# Patient Record
Sex: Female | Born: 1935 | Race: White | Hispanic: No | Marital: Married | Smoking: Never smoker
Health system: Southern US, Community
[De-identification: ages and names within clinical notes are randomized; demographics above are authoritative.]

## PROBLEM LIST (undated history)

## (undated) ENCOUNTER — Emergency Department (HOSPITAL_BASED_OUTPATIENT_CLINIC_OR_DEPARTMENT_OTHER): Discharge status: Absent without leave

## (undated) DIAGNOSIS — E119 Type 2 diabetes mellitus without complications: Secondary | ICD-10-CM

## (undated) DIAGNOSIS — I779 Disorder of arteries and arterioles, unspecified: Secondary | ICD-10-CM

## (undated) DIAGNOSIS — Z789 Other specified health status: Secondary | ICD-10-CM

## (undated) DIAGNOSIS — J45909 Unspecified asthma, uncomplicated: Secondary | ICD-10-CM

## (undated) DIAGNOSIS — M1990 Unspecified osteoarthritis, unspecified site: Secondary | ICD-10-CM

## (undated) DIAGNOSIS — G4733 Obstructive sleep apnea (adult) (pediatric): Secondary | ICD-10-CM

## (undated) DIAGNOSIS — J449 Chronic obstructive pulmonary disease, unspecified: Secondary | ICD-10-CM

## (undated) DIAGNOSIS — F419 Anxiety disorder, unspecified: Secondary | ICD-10-CM

## (undated) DIAGNOSIS — E785 Hyperlipidemia, unspecified: Secondary | ICD-10-CM

## (undated) DIAGNOSIS — F329 Major depressive disorder, single episode, unspecified: Secondary | ICD-10-CM

## (undated) DIAGNOSIS — K219 Gastro-esophageal reflux disease without esophagitis: Secondary | ICD-10-CM

## (undated) DIAGNOSIS — R0989 Other specified symptoms and signs involving the circulatory and respiratory systems: Secondary | ICD-10-CM

## (undated) DIAGNOSIS — R5381 Other malaise: Secondary | ICD-10-CM

## (undated) DIAGNOSIS — G4700 Insomnia, unspecified: Secondary | ICD-10-CM

## (undated) DIAGNOSIS — R42 Dizziness and giddiness: Secondary | ICD-10-CM

## (undated) DIAGNOSIS — F32A Depression, unspecified: Secondary | ICD-10-CM

## (undated) DIAGNOSIS — IMO0001 Reserved for inherently not codable concepts without codable children: Secondary | ICD-10-CM

## (undated) DIAGNOSIS — I739 Peripheral vascular disease, unspecified: Secondary | ICD-10-CM

## (undated) DIAGNOSIS — Z0389 Encounter for observation for other suspected diseases and conditions ruled out: Secondary | ICD-10-CM

## (undated) HISTORY — DX: Unspecified osteoarthritis, unspecified site: M19.90

## (undated) HISTORY — DX: Depression, unspecified: F32.A

## (undated) HISTORY — DX: Anxiety disorder, unspecified: F41.9

## (undated) HISTORY — DX: Disorder of arteries and arterioles, unspecified: I77.9

## (undated) HISTORY — PX: FOOT SURGERY: SHX648

## (undated) HISTORY — DX: Other malaise: R53.81

## (undated) HISTORY — PX: OTHER SURGICAL HISTORY: SHX169

## (undated) HISTORY — DX: Reserved for inherently not codable concepts without codable children: IMO0001

## (undated) HISTORY — DX: Other specified symptoms and signs involving the circulatory and respiratory systems: R09.89

## (undated) HISTORY — DX: Peripheral vascular disease, unspecified: I73.9

## (undated) HISTORY — PX: DILATION AND CURETTAGE OF UTERUS: SHX78

## (undated) HISTORY — DX: Other specified health status: Z78.9

## (undated) HISTORY — DX: Chronic obstructive pulmonary disease, unspecified: J44.9

## (undated) HISTORY — DX: Type 2 diabetes mellitus without complications: E11.9

## (undated) HISTORY — DX: Major depressive disorder, single episode, unspecified: F32.9

## (undated) HISTORY — DX: Morbid (severe) obesity due to excess calories: E66.01

## (undated) HISTORY — DX: Unspecified asthma, uncomplicated: J45.909

## (undated) HISTORY — DX: Hyperlipidemia, unspecified: E78.5

## (undated) HISTORY — DX: Insomnia, unspecified: G47.00

## (undated) HISTORY — DX: Encounter for observation for other suspected diseases and conditions ruled out: Z03.89

## (undated) HISTORY — DX: Obstructive sleep apnea (adult) (pediatric): G47.33

---

## 2000-06-27 ENCOUNTER — Encounter: Payer: Self-pay | Admitting: Emergency Medicine

## 2000-06-27 ENCOUNTER — Inpatient Hospital Stay (HOSPITAL_COMMUNITY): Admission: AC | Admit: 2000-06-27 | Discharge: 2000-07-01 | Payer: Self-pay

## 2000-06-27 ENCOUNTER — Encounter: Payer: Self-pay | Admitting: Otolaryngology

## 2000-06-28 ENCOUNTER — Encounter: Payer: Self-pay | Admitting: Surgery

## 2000-06-30 ENCOUNTER — Encounter: Payer: Self-pay | Admitting: General Surgery

## 2004-04-02 ENCOUNTER — Inpatient Hospital Stay: Payer: Self-pay | Admitting: Internal Medicine

## 2004-06-14 ENCOUNTER — Ambulatory Visit: Payer: Self-pay | Admitting: Gastroenterology

## 2004-06-15 ENCOUNTER — Ambulatory Visit: Payer: Self-pay | Admitting: Orthopaedic Surgery

## 2004-06-30 ENCOUNTER — Ambulatory Visit: Payer: Self-pay | Admitting: Orthopaedic Surgery

## 2004-07-07 ENCOUNTER — Ambulatory Visit: Payer: Self-pay | Admitting: Orthopaedic Surgery

## 2004-09-06 ENCOUNTER — Ambulatory Visit: Payer: Self-pay | Admitting: Internal Medicine

## 2004-09-09 ENCOUNTER — Ambulatory Visit: Payer: Self-pay | Admitting: Gastroenterology

## 2005-05-30 ENCOUNTER — Ambulatory Visit: Payer: Self-pay | Admitting: Internal Medicine

## 2005-06-06 ENCOUNTER — Ambulatory Visit: Payer: Self-pay | Admitting: Internal Medicine

## 2005-07-17 ENCOUNTER — Ambulatory Visit: Payer: Self-pay | Admitting: Psychiatry

## 2005-07-18 ENCOUNTER — Ambulatory Visit: Payer: Self-pay | Admitting: Psychiatry

## 2005-09-11 ENCOUNTER — Ambulatory Visit: Payer: Self-pay | Admitting: Internal Medicine

## 2005-10-30 ENCOUNTER — Ambulatory Visit: Payer: Self-pay | Admitting: Specialist

## 2005-10-30 ENCOUNTER — Encounter: Payer: Self-pay | Admitting: Internal Medicine

## 2006-02-19 ENCOUNTER — Other Ambulatory Visit: Payer: Self-pay

## 2006-02-19 ENCOUNTER — Inpatient Hospital Stay: Payer: Self-pay | Admitting: Internal Medicine

## 2006-03-06 ENCOUNTER — Ambulatory Visit: Payer: Self-pay | Admitting: Internal Medicine

## 2006-06-29 ENCOUNTER — Emergency Department: Payer: Self-pay | Admitting: General Practice

## 2006-09-18 ENCOUNTER — Ambulatory Visit: Payer: Self-pay | Admitting: Internal Medicine

## 2006-11-05 ENCOUNTER — Ambulatory Visit: Payer: Self-pay | Admitting: Internal Medicine

## 2006-11-15 ENCOUNTER — Ambulatory Visit: Payer: Self-pay | Admitting: *Deleted

## 2007-04-22 ENCOUNTER — Ambulatory Visit: Payer: Self-pay | Admitting: Podiatry

## 2007-05-23 ENCOUNTER — Ambulatory Visit: Payer: Self-pay | Admitting: *Deleted

## 2007-09-18 ENCOUNTER — Ambulatory Visit: Payer: Self-pay | Admitting: Vascular Surgery

## 2007-09-26 ENCOUNTER — Ambulatory Visit: Payer: Self-pay | Admitting: Internal Medicine

## 2007-10-30 ENCOUNTER — Inpatient Hospital Stay: Payer: Self-pay | Admitting: Internal Medicine

## 2007-10-30 ENCOUNTER — Ambulatory Visit: Payer: Self-pay | Admitting: Internal Medicine

## 2007-11-14 ENCOUNTER — Ambulatory Visit: Payer: Self-pay | Admitting: Internal Medicine

## 2007-11-21 ENCOUNTER — Ambulatory Visit: Payer: Self-pay | Admitting: *Deleted

## 2007-12-11 ENCOUNTER — Encounter: Payer: Self-pay | Admitting: Internal Medicine

## 2007-12-11 ENCOUNTER — Ambulatory Visit: Payer: Self-pay

## 2008-02-03 ENCOUNTER — Ambulatory Visit: Payer: Self-pay | Admitting: Podiatry

## 2008-02-07 ENCOUNTER — Ambulatory Visit: Payer: Self-pay | Admitting: Podiatry

## 2008-05-21 ENCOUNTER — Ambulatory Visit: Payer: Self-pay | Admitting: *Deleted

## 2008-10-19 ENCOUNTER — Ambulatory Visit: Payer: Self-pay | Admitting: Internal Medicine

## 2008-10-20 ENCOUNTER — Ambulatory Visit: Payer: Self-pay | Admitting: Internal Medicine

## 2008-10-29 ENCOUNTER — Encounter: Payer: Self-pay | Admitting: Internal Medicine

## 2008-11-02 ENCOUNTER — Encounter: Payer: Self-pay | Admitting: Internal Medicine

## 2008-12-01 ENCOUNTER — Ambulatory Visit: Payer: Self-pay | Admitting: Vascular Surgery

## 2008-12-02 ENCOUNTER — Encounter: Payer: Self-pay | Admitting: Internal Medicine

## 2009-01-02 ENCOUNTER — Encounter: Payer: Self-pay | Admitting: Internal Medicine

## 2009-05-05 ENCOUNTER — Ambulatory Visit: Payer: Self-pay | Admitting: Gastroenterology

## 2009-05-18 ENCOUNTER — Ambulatory Visit: Payer: Self-pay | Admitting: Vascular Surgery

## 2009-09-15 ENCOUNTER — Ambulatory Visit: Payer: Self-pay | Admitting: Internal Medicine

## 2009-10-27 ENCOUNTER — Ambulatory Visit: Payer: Self-pay | Admitting: Internal Medicine

## 2009-12-17 ENCOUNTER — Ambulatory Visit: Payer: Self-pay | Admitting: Vascular Surgery

## 2010-01-26 ENCOUNTER — Ambulatory Visit
Admission: RE | Admit: 2010-01-26 | Discharge: 2010-01-26 | Payer: Self-pay | Source: Home / Self Care | Attending: Internal Medicine | Admitting: Internal Medicine

## 2010-01-26 ENCOUNTER — Encounter: Payer: Self-pay | Admitting: Internal Medicine

## 2010-01-26 DIAGNOSIS — R0602 Shortness of breath: Secondary | ICD-10-CM | POA: Insufficient documentation

## 2010-01-26 DIAGNOSIS — R0789 Other chest pain: Secondary | ICD-10-CM | POA: Insufficient documentation

## 2010-01-26 DIAGNOSIS — I6529 Occlusion and stenosis of unspecified carotid artery: Secondary | ICD-10-CM | POA: Insufficient documentation

## 2010-02-03 NOTE — Assessment & Plan Note (Signed)
Summary: f/u/sab  Medications Added METFORMIN HCL 500 MG TABS (METFORMIN HCL) 1 tablet once daily ADVAIR DISKUS 100-50 MCG/DOSE AEPB (FLUTICASONE-SALMETEROL) as directed METROGEL 1 % GEL (METRONIDAZOLE) as directed NEXIUM 40 MG CPDR (ESOMEPRAZOLE MAGNESIUM) 1 tablet in the morning MAXZIDE 75-50 MG TABS (TRIAMTERENE-HCTZ) 1/2 tablet in the morning MICARDIS 80 MG TABS (TELMISARTAN) 1 tablet once daily NASONEX 50 MCG/ACT SUSP (MOMETASONE FUROATE) as directed, at night ALPRAZOLAM 1 MG TABS (ALPRAZOLAM) 1/2 tablet at night VENTOLIN HFA 108 (90 BASE) MCG/ACT AERS (ALBUTEROL SULFATE) as directed SIMVASTATIN 20 MG TABS (SIMVASTATIN) 1 tablet at bedtime GLUCOSAMINE-CHONDROITIN 250-200 MG CAPS (GLUCOSAMINE-CHONDROITIN) 1 tablet in the morning MULTIVITAMINS  TABS (MULTIPLE VITAMIN) 1 tablet once daily ECOTRIN 325 MG TBEC (ASPIRIN) 1 tablet once daily      Allergies Added: ! PENICILLIN ! PHENOBARBITAL  Visit Type:  Follow-up Primary Provider:  Dr. Bethann Punches  CC:  c/o SOB due to asthma. denies chest pain.Marland Kitchen  History of Present Illness: Amber Welch  is a 76 year old woman with multiple medical problems including hypertension, hyperlipidemia, diabetes, morbid obesity, obstructive sleep apnea on BiPAP, depression, and mild asthma.   I saw her for the first time at [facility] in 2009 when she was admitted with atypical chest pain.  She had a Myoview which showed normal ejection fraction and normal perfusion.  She has similar episode in 2008 for which she also had a normal Myoview.  She also had a Holter monitor with Dr. Candelaria Stagers which showed PACs.  F/u echo in 12/09 showed normal LV function with mild MR/   She had remote cardiac catheterization around 2003 which was normal. Returns to clinic after not being seen here for over 2 years.   Continues to have chronic DOE which she attributes to her asthma and obesity. Denies CP or signifcant palpitations. No edema.  Not exercising  regularly. Hard for her to walk due to foot problems. Occasionally does water aerobics. BP at home well controlled.   Has R carotid stenosis followed by [facility] in [place] q6 months. No neuro signs.   Preventive Screening-Counseling & Management  Alcohol-Tobacco     Smoking Status: never  Caffeine-Diet-Exercise     Does Patient Exercise: no      Drug Use:  no.    Current Medications (verified): 1)  Carvedilol 12.5 Mg Tabs (Carvedilol) .... Take One Tablet By Mouth Twice A Day 2)  Metformin Hcl 500 Mg Tabs (Metformin Hcl) .Marland Kitchen.. 1 Tablet Once Daily 3)  Advair Diskus 100-50 Mcg/dose Aepb (Fluticasone-Salmeterol) .... As Directed 4)  Metrogel 1 % Gel (Metronidazole) .... As Directed 5)  Nexium 40 Mg Cpdr (Esomeprazole Magnesium) .Marland Kitchen.. 1 Tablet in The Morning 6)  Maxzide 75-50 Mg Tabs (Triamterene-Hctz) .... 1/2 Tablet in The Morning 7)  Micardis 80 Mg Tabs (Telmisartan) .Marland Kitchen.. 1 Tablet Once Daily 8)  Nasonex 50 Mcg/act Susp (Mometasone Furoate) .... As Directed, At Night 9)  Alprazolam 1 Mg Tabs (Alprazolam) .... 1/2 Tablet At Night 10)  Ventolin Hfa 108 (90 Base) Mcg/act Aers (Albuterol Sulfate) .... As Directed 11)  Simvastatin 20 Mg Tabs (Simvastatin) .Marland Kitchen.. 1 Tablet At Bedtime 12)  Glucosamine-Chondroitin 250-200 Mg Caps (Glucosamine-Chondroitin) .Marland Kitchen.. 1 Tablet in The Morning 13)  Multivitamins  Tabs (Multiple Vitamin) .Marland Kitchen.. 1 Tablet Once Daily 14)  Ecotrin 325 Mg Tbec (Aspirin) .Marland Kitchen.. 1 Tablet Once Daily  Allergies (verified): 1)  ! Penicillin 2)  ! Phenobarbital  Past History:  Family History: Last updated: 01/26/2010 Father: Heart disease-deceased-MI Mother: Heart disease-deceased  Social History:  Last updated: 01/26/2010 Retired  Married  Tobacco Use - No.  Alcohol Use - no Regular Exercise - no Drug Use - no  Risk Factors: Exercise: no (01/26/2010)  Risk Factors: Smoking Status: never (01/26/2010)  Past Medical History: Hypertension Diabetes Type  1 Hyperlipidemia Obesity Chest pain    --previous normal cath 2003   --normal Myoview x 2 Obstructive Sleep Apnea on CPAP  Past Surgical History: right foot surgery right knee surgery  Family History: Father: Heart disease-deceased-MI Mother: Heart disease-deceased  Social History: Retired  Married  Tobacco Use - No.  Alcohol Use - no Regular Exercise - no Drug Use - no Smoking Status:  never Does Patient Exercise:  no Drug Use:  no  Review of Systems       As per HPI and past medical history; otherwise all systems negative.   Vital Signs:  Patient profile:   76 year old female Height:      66.5 inches Weight:      253 pounds BMI:     40.37 Pulse rate:   72 / minute BP sitting:   148 / 78  (left arm) Cuff size:   large  Vitals Entered By: Lysbeth Galas CMA (January 26, 2010 3:46 PM)  Physical Exam  General:  Obese. well appearing no resp difficulty HEENT: normal Neck: supple. no JVD. Carotids 2+ bilat; no bruits. No lymphadenopathy or thryomegaly appreciated. Cor: PMI nondisplaced. Regular rate & rhythm. No rubs, gallops, murmur. Lungs: clear Abdomen: obese. soft, nontender, nondistended.  Extremities: no cyanosis, clubbing, rash, edema Neuro: alert & orientedx3, cranial nerves grossly intact. moves all 4 extremities w/o difficulty. affect pleasant    Impression & Recommendations:  Problem # 1:  CHEST TIGHTNESS-PRESSURE-OTHER (ICD-786.59) Resolved. No further w/u at this time.   Problem # 2:  HYPERTENSION, BENIGN (ICD-401.1) BP mildly elevated here but apparently doing well at home. Will continue to follow with Dr. Hyacinth Meeker.   Problem # 3:  CAROTID ARTERY STENOSIS, RIGHT (ICD-433.10) Continue to follow with [facility]. Continue statin with goal LDL < 70.   Other Orders: EKG w/ Interpretation (93000)  Patient Instructions: 1)  Your physician recommends that you schedule a follow-up appointment in: follow up as needed

## 2010-04-07 ENCOUNTER — Ambulatory Visit: Payer: Medicare Other | Admitting: Unknown Physician Specialty

## 2010-05-17 NOTE — Procedures (Signed)
CAROTID DUPLEX EXAM   INDICATION:  Follow up known carotid artery disease.   HISTORY:  Diabetes:  Yes.  Cardiac:  A fib.  Hypertension:  Yes.  Smoking:  No.  Previous Surgery:  No.  CV History:  No.  Amaurosis Fugax No, Paresthesias No, Hemiparesis No.                                       RIGHT             LEFT  Brachial systolic pressure:         164               170  Brachial Doppler waveforms:         WNL               WNL  Vertebral direction of flow:        Antegrade         Antegrade  DUPLEX VELOCITIES (cm/sec)  CCA peak systolic                   101               134  ECA peak systolic                   262               118  ICA peak systolic                   250               92  ICA end diastolic                   71                30  PLAQUE MORPHOLOGY:                  Heterogenous      Heterogenous  PLAQUE AMOUNT:                      Moderate-to-severe                  Mild  PLAQUE LOCATION:                    ICA, ECA          ICA   IMPRESSION:  1. Right internal carotid artery suggests 60% to 70% stenosis.  2. Left internal carotid artery suggests 20% to 39% stenosis.  3. Right external carotid artery stenosis.  4. Bilateral vertebrals appear with antegrade flow.   ___________________________________________  Quita Skye Hart [place], M.D.   CB/MEDQ  D:  05/18/2009  T:  05/19/2009  Job:  [id number]

## 2010-05-17 NOTE — Procedures (Signed)
CAROTID DUPLEX EXAM   INDICATION:  Followup of known carotid artery disease.   HISTORY:  Diabetes:  Yes.  Cardiac:  Yes.  Hypertension:  Yes.  Smoking:  No.  Previous Surgery:  No.  CV History:  No.  Amaurosis Fugax No, Paresthesias No, Hemiparesis No                                       RIGHT             LEFT  Brachial systolic pressure:         152               160  Brachial Doppler waveforms:         WNL               WNL  Vertebral direction of flow:        Antegrade         Antegrade  DUPLEX VELOCITIES (cm/sec)  CCA peak systolic                   92                97  ECA peak systolic                   262               110  ICA peak systolic                   226               90  ICA end diastolic                   53                35  PLAQUE MORPHOLOGY:                  Heterogeneous     Heterogeneous  PLAQUE AMOUNT:                      Moderate          Mild  PLAQUE LOCATION:                    Bif/ICA/ECA       Bif/ICA   IMPRESSION:  1. 60-79% right internal carotid artery stenosis.  2. 20-39% left internal carotid artery stenosis.       ___________________________________________  P. Liliane Bade, M.D.   AC/MEDQ  D:  05/21/2008  T:  05/21/2008  Job:  [id number]

## 2010-05-17 NOTE — Assessment & Plan Note (Signed)
OFFICE VISIT   RASHAE, ROTHER  DOB:  06/02/1935                                       11/21/2007  CHART#:[id number]   The patient is a 76 year old female initially evaluated approximately 1  year ago with right internal carotid artery stenosis.  At that time she  had undergone an MRA of the neck revealing a severe right internal  carotid artery stenosis.  She was asymptomatic.  Doppler evaluation,  however, failed to reveal a high-grade stenosis.  Velocities at that  time measured 158/33 cm/s, consistent with a moderate 40-59% right ICA  stenosis.   She has undergone a repeat carotid Doppler in May of this year with  velocities 163/45 cm/s, again consistent with a 40-59% stenosis.  The  left internal carotid artery reveals mild disease.   The patient presents at this time with some recent complaints of  headache.  This is a general headache.  Not associated with any  instigating factors.  Relieved readily by mild analgesics.  No sensory  deficit, no motor deficit.  No visual disturbance.  No speech problems.   A recent CT angiogram of the neck was carried out at [facility]  and this was interpreted as showing an 80% right internal carotid artery  stenosis.  I did review these films today and feel that the right  internal carotid artery is not severely diseased.  A repeat carotid  Doppler today reveals velocities 221/48 cm/s, consistent with a 60-79%  right ICA stenosis.  This was a limited study.   The patient remains on aspirin daily.  She has recently been started on  a statin.   She has also apparently seen Dr. Arvilla Meres for paroxysmal atrial  fibrillation.   She is a moderately obese 76 year old female, appeared alert and  oriented, no distress.  Blood pressure is 134/83 in the left arm, 114/70  in the right arm, pulse is 67 per minute.  No carotid bruits.  Cranial  nerves intact.  Strength equal bilaterally.  Normal gait.   Putting  together all these findings, I do not feel that there has been  any significant progression of right carotid stenosis, this remains  moderate in severity.  She is not having any apparent symptoms related  to this, her headaches and occasional dizziness are certainly not focal  findings.   My recommendation at this time would be continued follow-up.  Will plan  a carotid Doppler 6 months and return visit to see me.  Should she have  any change in vision, sensation or motor function, I have asked her to  contact me please.   Balinda Quails, M.D.  Electronically Signed   PGH/MEDQ  D:  11/21/2007  T:  11/22/2007  Job:  [id number]   cc:   Claudette Laws. Bensimhon, MD  Annice Needy, MD

## 2010-05-17 NOTE — Procedures (Signed)
CAROTID DUPLEX EXAM   INDICATION:  Followup evaluation of known carotid disease.   HISTORY:  Diabetes:  Yes.  Cardiac:  Atrial fib.  Hypertension:  Yes.  Smoking:  No.  Previous Surgery:  No.  CV History:  No.  Amaurosis Fugax No, Paresthesias No, Hemiparesis No                                       RIGHT             LEFT  Brachial systolic pressure:         154               140  Brachial Doppler waveforms:         Normal            Normal  Vertebral direction of flow:        Antegrade         Antegrade  DUPLEX VELOCITIES (cm/sec)  CCA peak systolic                   117               125  ECA peak systolic                   298               90  ICA peak systolic                   295               106  ICA end diastolic                   42                30  PLAQUE MORPHOLOGY:                  Heterogeneous     Heterogeneous  PLAQUE AMOUNT:                      Moderate to severe                  Mild  PLAQUE LOCATION:                    ICA, ECA          ICA   IMPRESSION:  1. Right internal carotid artery suggests 60%-79% stenosis.  2. Left internal carotid artery suggests 20%-39% stenosis.  3. Right external carotid artery suggests stenosis.  4. Bilateral vertebrals appear antegrade.   ___________________________________________  Amber Welch. Hart [place], M.D.   CB/MEDQ  D:  12/01/2008  T:  12/02/2008  Job:  [id number]

## 2010-05-17 NOTE — Procedures (Signed)
CAROTID DUPLEX EXAM   INDICATION:  Followup, carotid artery disease.   HISTORY:  Diabetes:  Borderline.  Cardiac:  No.  Hypertension:  Yes.  Smoking:  No.  Previous Surgery:  No.  CV History:  No.  Amaurosis Fugax No, Paresthesias No, Hemiparesis No                                       RIGHT             LEFT  Brachial systolic pressure:         128               133  Brachial Doppler waveforms:         Biphasic          Biphasic  Vertebral direction of flow:        Antegrade         Antegrade  DUPLEX VELOCITIES (cm/sec)  CCA peak systolic                   64                82  ECA peak systolic                   122               97  ICA peak systolic                   163               85  ICA end diastolic                   45                29  PLAQUE MORPHOLOGY:                  Mixed             Mixed  PLAQUE AMOUNT:                      Moderate          Mild  PLAQUE LOCATION:                    Bifurcation/ICA   CCA/ICA   IMPRESSION:  1. Right internal carotid artery shows evidence of 40-59% stenosis.  2. Left internal carotid artery shows evidence of 1-39% stenosis.  3. No significant changes from previous study.   ___________________________________________  P. Liliane Bade, M.D.   AS/MEDQ  D:  05/23/2007  T:  05/23/2007  Job:  [id number]

## 2010-05-17 NOTE — Procedures (Signed)
CAROTID DUPLEX EXAM   INDICATION:  Follow up known carotid artery disease.   HISTORY:  Diabetes:  Borderline.  Cardiac:  No.  Hypertension:  Yes.  Smoking:  No.  Previous Surgery:  None.  CV History:  Amaurosis Fugax No, Paresthesias No, Hemiparesis No.                                       RIGHT             LEFT  Brachial systolic pressure:  Brachial Doppler waveforms:  Vertebral direction of flow:        Antegrade  DUPLEX VELOCITIES (cm/sec)  CCA peak systolic                   117  ECA peak systolic                   256  ICA peak systolic                   221  ICA end diastolic                   48  PLAQUE MORPHOLOGY:                  Heterogenous  PLAQUE AMOUNT:                      Moderate  PLAQUE LOCATION:                    ICA, ECA   IMPRESSION:  1. Limited study.  2. 60-79% stenosis noted in the right internal carotid artery.  3. Antegrade right vertebral artery.   ___________________________________________  P. Liliane Bade, M.D.   MG/MEDQ  D:  11/21/2007  T:  11/22/2007  Job:  [id number]

## 2010-05-17 NOTE — Assessment & Plan Note (Signed)
OFFICE VISIT   ALMARIE, KURDZIEL  DOB:  01/21/1934                                       12/17/2009  CHART#:[id number]   This is a former patient of Dr. Madilyn Fireman.   CHIEF COMPLAINT:  Carotid stenosis followup.   HISTORY OF PRESENT ILLNESS:  The patient is a 76 year old woman who was  initially seen by Dr. Madilyn Fireman in 2008 who had a carotid Doppler and MR  angio of the neck to evaluate her carotid stenosis.  She had a history  at that time of bilateral upper extremity numbness and also some left  arm pain.  She otherwise has had no symptoms since that time of  amaurosis fugax, speech or swallowing difficulties.  She has had no  weakness on one side or the other.   MEDICATIONS:  All medications were reviewed and her list was updated.   She has a history of hypertension and multiple medications have been  changed for that.  Otherwise there are no new significant health issues.  She continues to take medication for her hypercholesterolemia and she  also takes an aspirin per day.  She has a history of arthritis for which  she is also on some over-the-counter medications.   Vascular lab done today showed ICA peak systolic over end diastolic on  the right of 301/56.  Previous study done 6 months ago was 250/71.  The  left side has remained stable at 82/28.  The impression was right  internal carotid stenosis of 60%-79% in this asymptomatic patient.   PHYSICAL EXAM:  Heart rate was 74.  Blood pressure was 136/74 on the  left and respiratory rate was 10.  Neurologically she remained intact.  She had no tongue deviation.  No facial droop.  She was alert and  oriented x3.  She had good and equal strength in bilateral upper and  lower extremities.   ASSESSMENT:  Asymptomatic 60%-79% right internal carotid artery stenosis  with a slight increase in her peak systolic internal carotid velocities  on the right, the end diastolic was slightly less, the left was stable.   PLAN:  To have the patient follow up in 6 months with a carotid study.   Della Goo, PA-C   Leonides Sake, MD  Electronically Signed   RR/MEDQ  D:  12/17/2009  T:  12/17/2009  Job:  [id number]

## 2010-05-17 NOTE — Procedures (Signed)
CAROTID DUPLEX EXAM   INDICATION:  Followup evaluation of known carotid artery disease.   HISTORY:  Diabetes:  Yes.  Cardiac:  Atrial fibrillation.  Hypertension:  Yes.  Smoking:  No.  Previous Surgery:  No.  CV History:  No.  Amaurosis Fugax No, Paresthesias No, Hemiparesis No                                       RIGHT             LEFT  Brachial systolic pressure:  Brachial Doppler waveforms:  Vertebral direction of flow:        Antegrade         Antegrade  DUPLEX VELOCITIES (cm/sec)  CCA peak systolic                   101               92  ECA peak systolic                   315               102  ICA peak systolic                   301               82  ICA end diastolic                   56                28  PLAQUE MORPHOLOGY:                  Heterogeneous     Heterogeneous  PLAQUE AMOUNT:                      Moderate / severe Mild  PLAQUE LOCATION:                    ICA,              ICA   IMPRESSION:  1. Right internal carotid artery suggests 60-79% stenosis.  2. Left internal carotid artery suggests 1-39% stenosis.  3. Right external carotid artery stenosis.   ___________________________________________  Leonides Sake, MD   EM/MEDQ  D:  12/17/2009  T:  12/17/2009  Job:  [id number]

## 2010-05-17 NOTE — Procedures (Signed)
CAROTID DUPLEX EXAM   INDICATION:  Right carotid stenosis.   Patient had a carotid Doppler done at [facility] on 10/10/2006  which suggested a 75% stenosis in the right carotid bulb/ICA.   HISTORY:  Diabetes:  No  Cardiac:  COPD  Hypertension:  Yes  Smoking:  No  Previous Surgery:  No  CV History:  No  Amaurosis Fugax No, Paresthesias No, Hemiparesis No                                       RIGHT             LEFT  Brachial systolic pressure:         150               140  Brachial Doppler waveforms:         Biphasic          Biphasic  Vertebral direction of flow:        Antegrade  DUPLEX VELOCITIES (cm/sec)  CCA peak systolic                   94  ECA peak systolic                   134  ICA peak systolic                   158  ICA end diastolic                   33  PLAQUE MORPHOLOGY:                  Mixed  PLAQUE AMOUNT:                      Moderate  PLAQUE LOCATION:                    Distal CCA, proximal ICA   IMPRESSION:  1. A 40% to 59% right internal carotid artery stenosis.  2. Left side not assessed at this time.   ___________________________________________  P. Liliane Bade, M.D.   DP/MEDQ  D:  11/15/2006  T:  11/16/2006  Job:  [id number]

## 2010-05-17 NOTE — Assessment & Plan Note (Signed)
[facility]                            [place] OFFICE NOTE   NAME:Welch, Amber                          MRN:          [id number]  DATE:11/14/2007                            DOB:          09/29/1934    PRIMARY CARE PHYSICIAN:  Amber Welch.   INTERVAL HISTORY:  Amber Welch is a 76 year old woman with multiple  medical problems including hypertension, hyperlipidemia, diabetes,  obesity, obstructive sleep apnea on BiPAP, depression, and mild asthma.   I saw her for the first time at [facility] last  month when she was admitted with atypical chest pain.  She had a Myoview  which showed normal ejection fraction and normal perfusion.  She has  similar episode in 2008 for which she also had a normal Myoview.  She  recently had a Holter monitor with Amber Welch which showed PACs.  She  had remote cardiac catheterization about 6 years ago which she tells me  was normal.   She comes today for followup.  She continues to have irregular heart  beats.  She describes this as no occasional fluttering.  Usually it is  just a couple of beats, but she states it lasted a couple of minutes in  the past.  There has never been a documentation of a persistent  arrhythmia.  She is not known to have atrial fibrillation.  She denies  any chest pain.  She does get short of breath with moderate exertion.  She states she is unable to exercise due to taking care of her husband  and problems with arthritis in her feet.   She has also been evaluated for probable right carotid stenosis.  She is  pending possible endarterectomy.   CURRENT MEDICATIONS:  1. Carvedilol 6.25 b.i.d.  2. Advair 100/50 b.i.d.  3. Nexium 40 a day.  4. Norvasc 5 a day.  5. Maxzide 75/50 a day.  6. Diovan 320 a day.  7. Nasonex.  8. Lovastatin 20 a day.  9. Aspirin 325.  10.Doculase stool softener.  11.Multivitamin.   PHYSICAL EXAMINATION:  GENERAL:  She is an obese woman  in no acute  distress, ambulates around the clinic without any respiratory  difficulty.  VITAL SIGNS:  Blood pressure is 104/58, heart rate is 82, and weight is  242.  HEENT:  Normal.  NECK:  Supple and thick.  No obvious JVD.  Carotids are 2+ bilaterally  with a bruit on the right.  There is no lymphadenopathy or thyromegaly.  CARDIAC:  PMI is nondisplaced.  She is regularly irregular.  There is no  murmur, rubs, or gallops.  LUNGS:  Clear.  ABDOMEN:  Obese, nontender, and nondistended.  No obvious  hepatosplenomegaly.  No bruits.  No masses.  Good bowel sounds.  EXTREMITIES:  Warm with no cyanosis, clubbing, or edema.  SKIN:  No rash.  NEUROLOGIC:  Alert and oriented x3.  Cranial nerves II through XII are  intact.  Moves all four extremities without difficulty.  Affect is  pleasant.   EKG shows atrial trigeminy at a rate of  82.  No significant ST-T wave  abnormalities.   ASSESSMENT AND PLAN:  1. Palpitations.  These are due to frequent premature atrial      contractions.  I do not see any sustained arrhythmias.  I think we      spent a significant amount of time discussing the multiple triggers      including caffeine, stress, and sleep apnea.  She is doing her best      to watch her caffeine intake and I have asked her to follow up with      Amber Welch to make sure her sleep apnea is adequately treated.  We      will go ahead and stop her Norvasc and increase her carvedilol to      12.5 b.i.d. to see if increased beta-blocker will help her.  If she      continues to have symptoms, we might consider a longer event      monitor.  I did try to reassure her that these were benign.  2. Hypertension.  Blood pressure is well controlled.  3. Dyspnea.  This is likely due to her obesity and deconditioning.  I      suggested alternate forms of exercise other than walking including      recumbent bike.  She was somewhat reluctant about this.  4. Hyperlipidemia, followed by Amber Welch.   Goal LDL is less than 70.      Titrate statin as needed.   DISPOSITION:  We will see her back for followup in 6 months.     Amber Buckles. Bensimhon, MD  Electronically Signed    DRB/MedQ  DD: 11/14/2007  DT: 11/15/2007  Job #: [id number]

## 2010-05-17 NOTE — Consult Note (Signed)
VASCULAR SURGERY CONSULTATION   Som, Kinsly  DOB:  1934-10-18                                       11/15/2006  CHART#:[id number]   REFERRAL DIAGNOSIS:  Right internal carotid artery stenosis.   HISTORY:  The patient is a 76 year old female with a history of  hypertension, hyperlipidemia, and glucose intolerance.  She recently  underwent a carotid Doppler and MR angiogram of the neck to evaluate  carotid stenosis.  She has a history of bilateral upper extremity  numbness.  Also, some left arm pain.   Denies visual disturbance.  Denies motor deficit.  No gait  abnormalities.  No swallowing problems.  No speech abnormalities.   No documented history of stroke.   MR angiogram of the neck was reviewed and reveals evidence of a moderate  right internal carotid artery stenosis.  Limited right carotid Doppler  repeated in the office reveals velocity is 158/133 cm/second consistent  with a 40% to 59% right ICA stenosis.   PAST MEDICAL HISTORY:  1. Hypertension.  2. Hyperlipidemia.  3. Glucose intolerance.  4. Chronic depression.  5. Osteoarthritis.  6. Asthma.  7. Sleep apnea.  8. Reflux esophagitis.   PAST SURGICAL HISTORY:  1. D&C x2.  2. Left bunionectomy.  3. Bilateral hammertoe surgery.  4. Right knee arthroscopy.   MEDICATIONS:  1. Advair 100/50 b.i.d.  2. Loratadine 10 mg q.a.m.  3. Norvasc 5 mg q.a.m.  4. Maxzide 75/50 one tablet q.a.m.  5. Diovan 320 one tablet q.a.m.  6. Fluticasone nasal spray 2 puffs q.h.s.  7. Omeprazole 20 mg q.a.m.  8. Ativan 1 mg q.h.s.  9. Aspirin 325 mg daily.  10.Albuterol inhaler p.r.n.  11.Nabumetone 500 mg 1 daily.   ALLERGIES:  1. PENICILLIN.  2. PHENOBARBITAL  3. CONTRAST DYE.   FAMILY HISTORY:  Mother died age 40 of sudden death by a stroke or heart  attack.  She had had a coronary artery bypass procedure in her 54s.  Father died age 98 of an MI.  One sister living age 48 is generally  well.   SOCIAL  HISTORY:  The patient is married, lives with her husband in  [place] area.  They have 3 grown children.  Her children are well,  grandchildren are also well.  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  She does not currently smoke or use tobacco  products.  Denies alcohol use.   REVIEW OF SYSTEMS:  Refer to patient in counter form.  She notes some  mild weight loss due to recent diet change.  She denies fever or chills.  No anorexia.  No recent chest pain or palpitations.  Denies cough or  sputum production.  She does have a history of chronic bronchitis and  mild asthma which is controlled with her medications.  She has chronic  esophageal reflux symptoms and has been admitted to the hospital in the  past for this.  Denies change in bowel habits.  No dysuria or frequency.  No nonhealing ulceration.  She has a history of chronic depression.  Denies visual or hearing changes.  No bleeding problems.  No skin  rashes.   PHYSICAL EXAMINATION:  A 76 year old female appears younger than her  stated age.  Moderate obesity.  Alert and oriented, no acute distress.  No cyanosis, pallor, or jaundice.  Vital Signs:  BP 138/78, left arm,  140/77, right arm.  Pulse 78 per minute.  Respirations 18 per minute.  O2 sat 98%.  HEENT:  Mouth and throat are clear.  Normocephalic.  Extraocular movements intact.  Neck:  Supple.  No thyromegaly or  adenopathy.  Chest:  Equal air entry bilaterally.  No rales or rhonchi.  Normal percussion.  Cardiovascular:  No carotid bruits.  Normal heart  sounds without murmurs.  Regular rate and rhythm.  No rubs or murmurs.  Abdomen:  Mild obesity.  Nontender.  No masses or organomegaly.  Normal  bowel sounds.  No abdominal bruits.  Musculoskeletal:  No joint  deformity.  Full range of motion.  No joint swelling.  Neurologic:  Cranial nerves are intact.  Strength equal bilaterally.  Reflexes 2+  bilaterally.  Normal gait.  Speech is clear.  Skin:   No rashes or  ulceration.   IMPRESSION:  1. Moderate asymptomatic right internal carotid artery stenosis.  2. Hypertension.  3. Hyperlipidemia.  4. Osteoarthritis.  5. Sleep apnea.  6. Glucose intolerance.  7. Obesity.  8. Reflux esophagitis.   RECOMMENDATION:  The patient has asymptomatic moderate right internal  carotid artery stenosis.  This does not significantly increase her risk  of stroke at this time.  Agree with continued antiplatelet therapy of  aspirin 325 mg daily.  I have cautioned her regarding her recent high  fat diet to assure that her lipid profile continues to be monitored for  secondary prevention of cardiovascular disease and stroke.  Recommend  continued activity on a regular basis.  Patient cautioned regarding the  onset of any new symptoms including sensory or motor visual deficit to  contact an M.D.  We will otherwise plan follow up in 6 months with a  repeat carotid Doppler evaluation.   Balinda Quails, M.D.  Electronically Signed  PGH/MEDQ  D:  11/15/2006  T:  11/16/2006  Job:  [id number]   cc:   Bethann Punches
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
## 2010-06-17 ENCOUNTER — Encounter: Payer: Self-pay | Admitting: Cardiovascular Disease

## 2010-06-23 ENCOUNTER — Other Ambulatory Visit (INDEPENDENT_AMBULATORY_CARE_PROVIDER_SITE_OTHER): Payer: Medicare Other

## 2010-06-23 DIAGNOSIS — I6529 Occlusion and stenosis of unspecified carotid artery: Secondary | ICD-10-CM

## 2010-07-04 NOTE — Procedures (Unsigned)
CAROTID DUPLEX EXAM  INDICATION:  Carotid disease.  HISTORY: Diabetes:  Yes. Cardiac:  A fib. Hypertension:  Yes. Smoking:  No. Previous Surgery:  No. CV History:  Currently asymptomatic. Amaurosis Fugax No, Paresthesias No, Hemiparesis No.                                      RIGHT             LEFT Brachial systolic pressure:         148               138 Brachial Doppler waveforms:         Normal            Normal Vertebral direction of flow:        Antegrade         Antegrade DUPLEX VELOCITIES (cm/sec) CCA peak systolic                   88                120 ECA peak systolic                   237               105 ICA peak systolic                   272               6 ICA end diastolic                   53                17 PLAQUE MORPHOLOGY:                  Heterogenous      Heterogenous PLAQUE AMOUNT:                      Moderate          Mild PLAQUE LOCATION:                    ICA/bifurcation   ICA/bifurcation  IMPRESSION: 1. Doppler velocities suggest a low end 60% to 79% stenosis of the     right proximal internal carotid artery. 2. No hemodynamically significant stenosis of the left internal     carotid artery with plaque formations as described above. 3. No significant change in Doppler velocities when compared to the     previous examination.  ___________________________________________ Fransisco Hertz, MD  CH/MEDQ  D:  06/23/2010  T:  06/23/2010  Job:  [id number]

## 2010-10-06 ENCOUNTER — Ambulatory Visit: Payer: Medicare Other | Admitting: Internal Medicine

## 2010-11-21 ENCOUNTER — Ambulatory Visit: Payer: Medicare Other | Admitting: Unknown Physician Specialty

## 2010-12-12 ENCOUNTER — Institutional Professional Consult (permissible substitution): Payer: BC Managed Care – PPO | Admitting: Pulmonary Disease

## 2011-01-04 ENCOUNTER — Other Ambulatory Visit: Payer: BC Managed Care – PPO

## 2011-01-10 ENCOUNTER — Ambulatory Visit: Payer: Self-pay | Admitting: Internal Medicine

## 2011-01-20 ENCOUNTER — Encounter: Payer: Self-pay | Admitting: Vascular Surgery

## 2011-01-20 ENCOUNTER — Ambulatory Visit (INDEPENDENT_AMBULATORY_CARE_PROVIDER_SITE_OTHER): Payer: Medicare Other | Admitting: *Deleted

## 2011-01-20 ENCOUNTER — Ambulatory Visit (INDEPENDENT_AMBULATORY_CARE_PROVIDER_SITE_OTHER): Payer: Medicare Other | Admitting: Vascular Surgery

## 2011-01-20 VITALS — BP 131/76 | HR 75 | Resp 18 | Ht 66.5 in | Wt 249.0 lb

## 2011-01-20 DIAGNOSIS — I6529 Occlusion and stenosis of unspecified carotid artery: Secondary | ICD-10-CM

## 2011-01-20 NOTE — Progress Notes (Signed)
[facility]   Established Carotid Patient  History of Present Illness  Amber Welch is a 77 y.o. female who presents with chief complaint: routine follow-up.  Previous carotid studies demonstrated: RICA 60-79% stenosis, LICA <50% stenosis.  Patient has no history of TIA or stroke symptom.  The patient has never had amaurosis fugax or monocular blindness.  The patient has never had facial drooping or hemiplegia.  The patient has never had receptive or expressive aphasia.    Past Medical History, Past Surgical History, Social History, Family History, Medications, Allergies, and Review of Systems are unchanged from previous evaluation on 12/17/09.  Physical Examination  Filed Vitals:   01/20/11 1343 01/20/11 1346  BP: 143/73 131/76  Pulse: 74 75  Resp: 18   Height: 5' 6.5" (1.689 m)   Weight: 249 lb (112.946 kg)   SpO2: 97%    Body mass index is 39.59 kg/(m^2).   General: A&O x 3, WDWN, obese  Eyes: PERRLA, EOMI  Pulmonary: Sym exp, good air movt, CTAB, no rales, rhonchi, & wheezing  Cardiac: RRR, Nl S1, S2, no Murmurs, rubs or gallops  Vascular: Vessel Right Left  Radial Palpable Palpable  Brachial Palpable Palpable  Carotid Palpable, without bruit Palpable, without bruit  Aorta Non-palpable N/A  Femoral Palpable Palpable  Popliteal Non-palpable Non-palpable  PT Non-Palpable Non-Palpable  DP Non-Palpable Non-Palpable   Gastrointestinal: soft, NTND, -G/R, - HSM, - masses, - CVAT B  Musculoskeletal: M/S 5/5 throughout , Extremities without ischemic changes   Neurologic: CN 2-12 intact , Pain and light touch intact in extremities , Motor exam as listed above  Non-Invasive Vascular Imaging  CAROTID DUPLEX (Date: 01/20/11):   R ICA stenosis: 40-59%  R VA: patent and antegrade  L ICA stenosis: <50%  L VA: patent and antegrade  Medical Decision Making  Amber Welch is a 77 y.o. female who presents with: asx R ICA stenosis <  80%.   Based on the patient's vascular studies and examination, I have offered the patient: routine surveillance on a 6 month basis.  Based on ACAS, there is no advantage to proceeding with prophylactic CEA in this patient  I discussed in depth with the patient the nature of atherosclerosis, and emphasized the importance of maximal medical management including strict control of blood pressure, blood glucose, and lipid levels, obtaining regular exercise, antiplatelet agents, and cessation of smoking.  The patient is aware that without maximal medical management the underlying atherosclerotic disease process will progress, limiting the benefit of any interventions.  Thank you for allowing [place] to participate in this patient's care.  Leonides Sake, MD [facility] [place] Office: [phone] Pager: [phone]  01/20/2011, 7:29 PM

## 2011-01-26 NOTE — Procedures (Unsigned)
CAROTID DUPLEX EXAM  INDICATION:  Carotid disease.  HISTORY: Diabetes:  Yes. Cardiac:  Atrial fibrillation. Hypertension:  Yes. Smoking:  No. Previous Surgery:  No. CV History:  Asymptomatic. Amaurosis Fugax No, Paresthesias No, Hemiparesis No.                                      RIGHT             LEFT Brachial systolic pressure:         143               140 Brachial Doppler waveforms:         Normal            Normal Vertebral direction of flow:        Antegrade         Antegrade DUPLEX VELOCITIES (cm/sec) CCA peak systolic                   77                63 ECA peak systolic                   297               115 ICA peak systolic                   246               83 ICA end diastolic                   54                25 PLAQUE MORPHOLOGY:                  Heterogenous      Heterogenous PLAQUE AMOUNT:                      Moderate          Mild PLAQUE LOCATION:                    ICA/bifurcation   ICA/bifurcation  IMPRESSION: 1. Right internal carotid artery velocities suggest a high-end 40% to     59% stenosis. 2. Right external carotid artery stenosis. 3. Stenosis at the right bifurcation which extends into the origin of     the internal carotid artery and external carotid artery. 4. No hemodynamically significant stenosis of the left internal     carotid artery. 5. Stable in comparison to the last examination.  ___________________________________________ Fransisco Hertz, MD  EM/MEDQ  D:  01/20/2011  T:  01/20/2011  Job:  [id number]

## 2011-05-11 ENCOUNTER — Ambulatory Visit (INDEPENDENT_AMBULATORY_CARE_PROVIDER_SITE_OTHER): Payer: Medicare Other | Admitting: Cardiovascular Disease

## 2011-05-11 ENCOUNTER — Encounter: Payer: Self-pay | Admitting: Cardiovascular Disease

## 2011-05-11 VITALS — BP 154/85 | HR 71 | Ht 66.0 in | Wt 247.5 lb

## 2011-05-11 DIAGNOSIS — I6529 Occlusion and stenosis of unspecified carotid artery: Secondary | ICD-10-CM

## 2011-05-11 DIAGNOSIS — R0602 Shortness of breath: Secondary | ICD-10-CM

## 2011-05-11 DIAGNOSIS — R0989 Other specified symptoms and signs involving the circulatory and respiratory systems: Secondary | ICD-10-CM

## 2011-05-11 DIAGNOSIS — E785 Hyperlipidemia, unspecified: Secondary | ICD-10-CM | POA: Insufficient documentation

## 2011-05-11 DIAGNOSIS — Z01810 Encounter for preprocedural cardiovascular examination: Secondary | ICD-10-CM

## 2011-05-11 DIAGNOSIS — I10 Essential (primary) hypertension: Secondary | ICD-10-CM

## 2011-05-11 DIAGNOSIS — R030 Elevated blood-pressure reading, without diagnosis of hypertension: Secondary | ICD-10-CM

## 2011-05-11 MED ORDER — NITROGLYCERIN 0.4 MG SL SUBL: 0.4000 mg | SUBLINGUAL_TABLET | SUBLINGUAL | Status: DC | PRN

## 2011-05-11 NOTE — Progress Notes (Signed)
Patient ID: Amber Welch, female    DOB: September 22, 1934, 77 y.o.   MRN: [id number]  HPI Comments: Amber Welch  is a 77 year old woman with  hypertension, hyperlipidemia, diabetes, morbid obesity, obstructive sleep apnea on BiPAP, depression, and mild asthma.   admitted with atypical chest pain to [facility] in 2009.  Myoview  showed normal ejection fraction and normal perfusion.  similar episode in 2008 for which she also had a normal Myoview.  She also had a Holter monitor with Dr. Candelaria Stagers which showed PACs.  F/u echo in 12/09 showed normal LV function with mild MR. she presents for routine followup. She has chronic shortness of breath likely secondary to asthma and obesity.   She had remote cardiac catheterization around 2003 which was normal.  She reports that she needs a right totally replacement. She has bilateral knee pain that is severe. She has been monitoring her blood pressure at home and reports that it has been labile. She has seen Dr. Hyacinth Meeker in the past and approximately 4-5 months ago, she had an episode in his office with documented severe hypotension with systolic pressure of 80. She has documentation with her showing similar episodes approximately once per week or more. Systolic pressures will range from 90 up to 200. Average blood pressure has been approximately 150-160. She does have episodes of near-syncope when her blood pressure is low.   Previous evaluation of her carotid show severe right carotid disease, currently under surveillance. She's not tolerant of statins  EKG shows normal sinus rhythm with rate 71 beats per minute, no significant ST or T wave changes    Outpatient Encounter Prescriptions as of 05/11/2011  Medication Sig Dispense Refill  . albuterol (VENTOLIN HFA) 108 (90 BASE) MCG/ACT inhaler Inhale 2 puffs into the lungs every 6 (six) hours as needed. UAD       . ALPRAZolam (XANAX) 1 MG tablet Take 0.5 mg by mouth at bedtime. Takes 0.5 and 1/2 of pill at  night.      Marland Kitchen aspirin 81 MG tablet Take 81 mg by mouth daily.      . Azelastine HCl (ASTEPRO NA) Place into the nose.      . carvedilol (COREG) 12.5 MG tablet Take 12.5 mg by mouth 2 (two) times daily.        Marland Kitchen docusate sodium (COLACE) 50 MG capsule Take by mouth 2 (two) times daily.      Marland Kitchen EDARBYCLOR 40-25 MG TABS daily.      Marland Kitchen esomeprazole (NEXIUM) 40 MG capsule Take 40 mg by mouth daily before breakfast.        . FINACEA 15 % cream       . Fluticasone-Salmeterol (ADVAIR DISKUS) 100-50 MCG/DOSE AEPB Inhale 1 puff into the lungs every 12 (twelve) hours. UAD       . Glucosamine-Chondroitin 250-200 MG CAPS Take 1 capsule by mouth every morning.        . metFORMIN (GLUCOPHAGE) 500 MG tablet Take 500 mg by mouth daily.        . metroNIDAZOLE (METROGEL) 1 % gel Apply topically daily. UAD       . mometasone (NASONEX) 50 MCG/ACT nasal spray Place 2 sprays into the nose daily. UAD at night       . Multiple Vitamin (MULTIVITAMIN) tablet Take 1 tablet by mouth daily.        . potassium chloride (KLOR-CON) 8 MEQ tablet Take 8 mEq by mouth as directed.      Marland Kitchen  torsemide (DEMADEX) 10 MG tablet Take 10 mg by mouth daily.      Marland Kitchen UNABLE TO FIND every morning. Zyflamend       . nitroGLYCERIN (NITROSTAT) 0.4 MG SL tablet Place 1 tablet (0.4 mg total) under the tongue every 5 (five) minutes as needed for chest pain.  25 tablet  6    Review of Systems  Constitutional: Negative.   HENT: Negative.   Eyes: Negative.   Respiratory: Positive for shortness of breath.   Cardiovascular: Negative.   Gastrointestinal: Negative.   Musculoskeletal: Negative.   Skin: Negative.   Neurological: Positive for light-headedness.       Episodes of near-syncope when blood pressure is low  Hematological: Negative.   Psychiatric/Behavioral: Negative.   All other systems reviewed and are negative.    BP 154/85  Pulse 71  Ht 5\' 6"  (1.676 m)  Wt 247 lb 8 oz (112.265 kg)  BMI 39.95 kg/m2  Physical Exam  Nursing note  and vitals reviewed. Constitutional: She is oriented to person, place, and time. She appears well-developed and well-nourished.       Obese  HENT:  Head: Normocephalic.  Nose: Nose normal.  Mouth/Throat: Oropharynx is clear and moist.  Eyes: Conjunctivae are normal. Pupils are equal, round, and reactive to light.  Neck: Normal range of motion. Neck supple. No JVD present.  Cardiovascular: Normal rate, regular rhythm, S1 normal, S2 normal and intact distal pulses.  Exam reveals no gallop and no friction rub.   Murmur heard.  Crescendo systolic murmur is present with a grade of 2/6  Pulmonary/Chest: Effort normal and breath sounds normal. No respiratory distress. She has no wheezes. She has no rales. She exhibits no tenderness.  Abdominal: Soft. Bowel sounds are normal. She exhibits no distension. There is no tenderness.  Musculoskeletal: Normal range of motion. She exhibits no edema and no tenderness.  Lymphadenopathy:    She has no cervical adenopathy.  Neurological: She is alert and oriented to person, place, and time. Coordination normal.  Skin: Skin is warm and dry. No rash noted. No erythema.  Psychiatric: She has a normal mood and affect. Her behavior is normal. Judgment and thought content normal.         Assessment and Plan

## 2011-05-11 NOTE — Assessment & Plan Note (Signed)
60% right carotid disease. She is unable to tolerate statins. We have suggested red yeast rice.

## 2011-05-11 NOTE — Assessment & Plan Note (Signed)
I suspect we'll have to continue to closely monitor her blood pressure and let her high on a regular basis given documented drops and her pressure.

## 2011-05-11 NOTE — Assessment & Plan Note (Signed)
Her biggest complaint is her labile blood pressure. This will be challenging to manage as additional medications on a regular basis for hypertension would likely exacerbate the extent and frequency of hypotension. I suspect she has possible autonomic issue. This has been going on for quite some time. I suggested if her blood pressure is severely elevated and does not decrease on recheck, she could possibly take nitroglycerin sublingual one half tab while lying in bed. This would be short acting and any drop in blood pressure would be brief. I hesitate to start her on short acting nitrates/hydralazine or other medication given her tendency to drop.

## 2011-05-11 NOTE — Assessment & Plan Note (Signed)
We have recommended red yeast rice. She does not want to try other statins. She reports having tried simvastatin and possibly to others with significant muscle ache.

## 2011-05-11 NOTE — Assessment & Plan Note (Signed)
She would be an acceptable risk for upcoming right knee total placement surgery. She may have labile blood pressures that could be managed by anesthesia in the perioperative period. No further testing is needed at this time.

## 2011-05-11 NOTE — Patient Instructions (Addendum)
You are doing well. No medication changes were made.  Try red yeast rice 2 to 4 a day for cholesterol  Please take nitroglycerin 1/2 pill for severely elevated blood pressure (greater than 190)  Please call us if you have new issues that need to be addressed before your next appt.  Your physician wants you to follow-up in: 6 months.  You will receive a reminder letter in the mail two months in advance. If you don't receive a letter, please call our office to schedule the follow-up appointment.

## 2011-05-22 ENCOUNTER — Ambulatory Visit: Payer: Self-pay | Admitting: General Practice

## 2011-05-22 LAB — URINALYSIS, COMPLETE
Bilirubin,UR: NEGATIVE
Glucose,UR: NEGATIVE mg/dL (ref 0–75)
Ketone: NEGATIVE
Leukocyte Esterase: NEGATIVE
Protein: NEGATIVE
RBC,UR: 3 /HPF (ref 0–5)
Specific Gravity: 1.009 (ref 1.003–1.030)
Squamous Epithelial: NONE SEEN

## 2011-05-22 LAB — SEDIMENTATION RATE: Erythrocyte Sed Rate: 18 mm/hr (ref 0–30)

## 2011-05-22 LAB — MRSA PCR SCREENING

## 2011-05-23 LAB — URINE CULTURE

## 2011-06-05 ENCOUNTER — Inpatient Hospital Stay: Payer: Self-pay | Admitting: General Practice

## 2011-06-05 HISTORY — PX: JOINT REPLACEMENT: SHX530

## 2011-06-06 LAB — BASIC METABOLIC PANEL
Anion Gap: 10 (ref 7–16)
BUN: 14 mg/dL (ref 7–18)
Calcium, Total: 7.9 mg/dL — ABNORMAL LOW (ref 8.5–10.1)
Chloride: 98 mmol/L (ref 98–107)
Creatinine: 0.79 mg/dL (ref 0.60–1.30)
EGFR (African American): >60
EGFR (Non-African Amer.): >60
Glucose: 138 mg/dL — ABNORMAL HIGH (ref 65–99)
Potassium: 3.1 mmol/L — ABNORMAL LOW (ref 3.5–5.1)

## 2011-06-06 LAB — HEMOGLOBIN: HGB: 10.3 g/dL — ABNORMAL LOW (ref 12.0–16.0)

## 2011-06-07 LAB — CBC WITH DIFFERENTIAL/PLATELET
Basophil %: 0.2 %
Eosinophil %: 1.2 %
HCT: 28.4 % — ABNORMAL LOW (ref 35.0–47.0)
Lymphocyte #: 2.4 10*3/uL (ref 1.0–3.6)
Lymphocyte %: 25.5 %
MCH: 29.5 pg (ref 26.0–34.0)
MCV: 88 fL (ref 80–100)
Monocyte #: 0.9 x10 3/mm (ref 0.2–0.9)
Neutrophil #: 5.9 10*3/uL (ref 1.4–6.5)
Neutrophil %: 63 %
RBC: 3.23 10*6/uL — ABNORMAL LOW (ref 3.80–5.20)
RDW: 14.2 % (ref 11.5–14.5)
WBC: 9.3 10*3/uL (ref 3.6–11.0)

## 2011-06-07 LAB — BASIC METABOLIC PANEL
Anion Gap: 10 (ref 7–16)
BUN: 14 mg/dL (ref 7–18)
Co2: 30 mmol/L (ref 21–32)
Creatinine: 0.72 mg/dL (ref 0.60–1.30)
EGFR (African American): >60
EGFR (Non-African Amer.): >60
Glucose: 122 mg/dL — ABNORMAL HIGH (ref 65–99)
Osmolality: 266 (ref 275–301)
Potassium: 3 mmol/L — ABNORMAL LOW (ref 3.5–5.1)
Sodium: 132 mmol/L — ABNORMAL LOW (ref 136–145)

## 2011-06-07 LAB — HEMOGLOBIN: HGB: 9.5 g/dL — ABNORMAL LOW (ref 12.0–16.0)

## 2011-06-08 LAB — CBC WITH DIFFERENTIAL/PLATELET
Basophil #: 0 10*3/uL (ref 0.0–0.1)
Eosinophil #: 0.2 10*3/uL (ref 0.0–0.7)
HCT: 24.8 % — ABNORMAL LOW (ref 35.0–47.0)
HGB: 8.4 g/dL — ABNORMAL LOW (ref 12.0–16.0)
MCH: 29.9 pg (ref 26.0–34.0)
MCHC: 34 g/dL (ref 32.0–36.0)
MCV: 88 fL (ref 80–100)
Monocyte %: 11.5 %
Neutrophil %: 66.3 %
Platelet: 222 10*3/uL (ref 150–440)
RBC: 2.83 10*6/uL — ABNORMAL LOW (ref 3.80–5.20)
RDW: 13.7 % (ref 11.5–14.5)
WBC: 8.1 10*3/uL (ref 3.6–11.0)

## 2011-06-08 LAB — BASIC METABOLIC PANEL
Anion Gap: 10 (ref 7–16)
BUN: 16 mg/dL (ref 7–18)
Co2: 28 mmol/L (ref 21–32)
Creatinine: 0.72 mg/dL (ref 0.60–1.30)
EGFR (Non-African Amer.): >60
Glucose: 124 mg/dL — ABNORMAL HIGH (ref 65–99)
Osmolality: 267 (ref 275–301)
Potassium: 3.7 mmol/L (ref 3.5–5.1)
Sodium: 132 mmol/L — ABNORMAL LOW (ref 136–145)

## 2011-06-09 ENCOUNTER — Encounter: Payer: Self-pay | Admitting: Internal Medicine

## 2011-06-13 LAB — BASIC METABOLIC PANEL
Creatinine: 0.88 mg/dL (ref 0.60–1.30)
EGFR (Non-African Amer.): >60
Glucose: 145 mg/dL — ABNORMAL HIGH (ref 65–99)
Potassium: 3.3 mmol/L — ABNORMAL LOW (ref 3.5–5.1)
Sodium: 128 mmol/L — ABNORMAL LOW (ref 136–145)

## 2011-07-21 ENCOUNTER — Ambulatory Visit (INDEPENDENT_AMBULATORY_CARE_PROVIDER_SITE_OTHER): Payer: Medicare Other | Admitting: Neurosurgery

## 2011-07-21 ENCOUNTER — Encounter: Payer: Self-pay | Admitting: Neurosurgery

## 2011-07-21 ENCOUNTER — Other Ambulatory Visit (INDEPENDENT_AMBULATORY_CARE_PROVIDER_SITE_OTHER): Payer: Medicare Other | Admitting: *Deleted

## 2011-07-21 VITALS — BP 132/67 | HR 67 | Resp 16 | Ht 66.5 in | Wt 230.0 lb

## 2011-07-21 DIAGNOSIS — I6529 Occlusion and stenosis of unspecified carotid artery: Secondary | ICD-10-CM

## 2011-07-21 NOTE — Progress Notes (Signed)
[facility] Carotid Office Note  CC: Six-month carotid duplex Referring Physician: Imogene Burn  History of Present Illness: 77 year old female patient of Dr. Imogene Burn seen for six-month surveillance for carotid stenosis. The patient has a history of 60-79% carotid stenosis on the right with the left remaining in the 39% range. The patient denies any signs or symptoms of CVA, TIA, amaurosis fugax or any neural deficit. The patient states she did have a "swimming vision" episode the other night watching television but she did not determine if it was one eye versus both eyes. Patient also states she underwent a right total knee arthroplasty with Dr. Ernest Pine one month ago.  Past Medical History  Diagnosis Date  . HTN (hypertension)   . Diabetes type 1, controlled     whether it was controlled was not specified  . Hyperlipidemia   . Obesity   . Chest pain     previous normal cath 2003. normal Myoview x2  . OSA (obstructive sleep apnea)     CPAP  . Arthritis     Back and knees  . Carotid artery occlusion 2009    [place]    ROS: [x]  Positive   [ ]  Denies    General: [ ]  Weight loss, [ ]  Fever, [ ]  chills Neurologic: [ ]  Dizziness, [ ]  Blackouts, [ ]  Seizure [ ]  Stroke, [ ]  "Mini stroke", [ ]  Slurred speech, [ ]  Temporary blindness; [ ]  weakness in arms or legs, [ ]  Hoarseness Cardiac: [ ]  Chest pain/pressure, [ ]  Shortness of breath at rest [ ]  Shortness of breath with exertion, [ ]  Atrial fibrillation or irregular heartbeat Vascular: [ ]  Pain in legs with walking, [ ]  Pain in legs at rest, [ ]  Pain in legs at night,  [ ]  Non-healing ulcer, [ ]  Blood clot in vein/DVT,   Pulmonary: [ ]  Home oxygen, [ ]  Productive cough, [ ]  Coughing up blood, [ ]  Asthma,  [ ]  Wheezing Musculoskeletal:  [ ]  Arthritis, [ ]  Low back pain, [ ]  Joint pain Hematologic: [ ]  Easy Bruising, [ ]  Anemia; [ ]  Hepatitis Gastrointestinal: [ ]  Blood in stool, [ ]  Gastroesophageal  Reflux/heartburn, [ ]  Trouble swallowing Urinary: [ ]  chronic Kidney disease, [ ]  on HD - [ ]  MWF or [ ]  TTHS, [ ]  Burning with urination, [ ]  Difficulty urinating Skin: [ ]  Rashes, [ ]  Wounds Psychological: [ ]  Anxiety, [ ]  Depression   Social History History  Substance Use Topics  . Smoking status: Never Smoker   . Smokeless tobacco: Not on file   Comment: tobacco use - no  . Alcohol Use: No    Family History Family History  Problem Relation Age of Onset  . Heart disease Father   . Heart disease Mother     Allergies  Allergen Reactions  . Penicillins   . Phenobarbital     Current Outpatient Prescriptions  Medication Sig Dispense Refill  . albuterol (VENTOLIN HFA) 108 (90 BASE) MCG/ACT inhaler Inhale 2 puffs into the lungs every 6 (six) hours as needed. UAD       . ALPRAZolam (XANAX) 1 MG tablet Take 0.5 mg by mouth at bedtime. Takes 0.5 and 1/2 of pill at night.      Marland Kitchen aspirin 81 MG tablet Take 81 mg by mouth daily.      . Azelastine HCl (ASTEPRO NA) Place into the nose.      . carvedilol (COREG) 12.5 MG tablet Take 12.5  mg by mouth 2 (two) times daily.        Marland Kitchen docusate sodium (COLACE) 50 MG capsule Take by mouth 2 (two) times daily.      Marland Kitchen EDARBYCLOR 40-25 MG TABS daily.      Marland Kitchen esomeprazole (NEXIUM) 40 MG capsule Take 40 mg by mouth daily before breakfast.        . FINACEA 15 % cream       . Fluticasone-Salmeterol (ADVAIR DISKUS) 100-50 MCG/DOSE AEPB Inhale 1 puff into the lungs every 12 (twelve) hours. UAD       . gabapentin (NEURONTIN) 100 MG capsule Take 100 mg by mouth BID times 48H.      Marland Kitchen Glucosamine-Chondroitin 250-200 MG CAPS Take 1 capsule by mouth every morning.        . metFORMIN (GLUCOPHAGE) 500 MG tablet Take 500 mg by mouth daily.        . metroNIDAZOLE (METROGEL) 1 % gel Apply topically daily. UAD       . mometasone (NASONEX) 50 MCG/ACT nasal spray Place 2 sprays into the nose daily. UAD at night       . Multiple Vitamin (MULTIVITAMIN) tablet Take 1  tablet by mouth daily.        . nitroGLYCERIN (NITROSTAT) 0.4 MG SL tablet Place 1 tablet (0.4 mg total) under the tongue every 5 (five) minutes as needed for chest pain.  25 tablet  6  . traMADol (ULTRAM) 50 MG tablet Take 50 mg by mouth as needed.      Marland Kitchen UNABLE TO FIND every morning. Zyflamend       . potassium chloride (KLOR-CON) 8 MEQ tablet Take 8 mEq by mouth as directed.      . torsemide (DEMADEX) 10 MG tablet Take 10 mg by mouth daily.        Physical Examination  Filed Vitals:   07/21/11 1417  BP: 132/67  Pulse: 67  Resp:     Body mass index is 36.57 kg/(m^2).  General:  WDWN in NAD Gait: Normal HEENT: WNL Eyes: Pupils equal Pulmonary: normal non-labored breathing , without Rales, rhonchi,  wheezing Cardiac: RRR, without  Murmurs, rubs or gallops; Abdomen: soft, NT, no masses Skin: no rashes, ulcers noted  Vascular Exam Pulses: 3+ radial pulses bilaterally Carotid bruits: Carotid pulses to auscultation bilaterally left greater than right Extremities without ischemic changes, no Gangrene , no cellulitis; no open wounds;  Musculoskeletal: no muscle wasting or atrophy   Neurologic: A&O X 3; Appropriate Affect ; SENSATION: normal; MOTOR FUNCTION:  moving all extremities equally. Speech is fluent/normal  Non-Invasive Vascular Imaging CAROTID DUPLEX 07/21/2011  Right ICA 40 - 59 % stenosis Left ICA 20 - 39 % stenosis Previous carotid studies were reviewed  ASSESSMENT/PLAN: Asymptomatic patient with a history of 60-79% right carotid stenosis that was read today at 40-59% the left side remains in the 20-39% range. The patient will followup in 6 months with repeat carotid duplex. The patient's questions were encouraged and answered, she is in agreement with this plan. The patient knows the signs and symptoms of CVA and knows report to the nearest emergency department should that occur.  Lauree Chandler ANP   Clinic MD: Imogene Burn

## 2011-07-28 NOTE — Procedures (Unsigned)
CAROTID DUPLEX EXAM  INDICATION:  Carotid disease.  HISTORY: Diabetes:  Yes Cardiac:  No Hypertension:  Yes Smoking:  No Previous Surgery:  No CV History: Amaurosis Fugax No, Paresthesias No, Hemiparesis No                                      RIGHT             LEFT Brachial systolic pressure:         144               144 Brachial Doppler waveforms:         WNL               WNL Vertebral direction of flow:        Antegrade         Antegrade DUPLEX VELOCITIES (cm/sec) CCA peak systolic                   104               111 ECA peak systolic                   229               86 ICA peak systolic                   334               71 ICA end diastolic                   40                16 PLAQUE MORPHOLOGY:                  Heterogeneous     Heterogeneous PLAQUE AMOUNT:                      Moderate          Mild PLAQUE LOCATION:                    Bifurcation       ICA  IMPRESSION: 1. 40%-59% right internal carotid artery stenosis by end diastolic     velocity.  Peak systolic velocity and image suggests higher. 2. Elevated velocities of right external carotid artery appear to be     reflected from the bifurcation.  No disease is visualized within     the vessel. 3. 1%-39% left internal carotid artery stenosis. 4. Bilateral vertebral arteries are within normal limits. 5. Technically difficult study due to respiratory motion.  ___________________________________________ Fransisco Hertz, MD  LT/MEDQ  D:  07/21/2011  T:  07/21/2011  Job:  [id number]

## 2011-09-13 ENCOUNTER — Ambulatory Visit: Payer: Self-pay | Admitting: General Practice

## 2011-09-13 DIAGNOSIS — I10 Essential (primary) hypertension: Secondary | ICD-10-CM

## 2011-09-13 LAB — PROTIME-INR
INR: 0.9
Prothrombin Time: 12.7 secs (ref 11.5–14.7)

## 2011-09-13 LAB — MRSA PCR SCREENING

## 2011-09-14 LAB — URINE CULTURE

## 2011-09-20 ENCOUNTER — Inpatient Hospital Stay: Payer: Self-pay | Admitting: General Practice

## 2011-09-20 HISTORY — PX: JOINT REPLACEMENT: SHX530

## 2011-09-21 LAB — BASIC METABOLIC PANEL
Anion Gap: 8 (ref 7–16)
BUN: 17 mg/dL (ref 7–18)
Calcium, Total: 8.4 mg/dL — ABNORMAL LOW (ref 8.5–10.1)
Creatinine: 0.77 mg/dL (ref 0.60–1.30)
EGFR (African American): >60
EGFR (Non-African Amer.): >60
Glucose: 166 mg/dL — ABNORMAL HIGH (ref 65–99)
Osmolality: 277 (ref 275–301)
Sodium: 136 mmol/L (ref 136–145)

## 2011-09-22 LAB — BASIC METABOLIC PANEL
Anion Gap: 7 (ref 7–16)
BUN: 14 mg/dL (ref 7–18)
Calcium, Total: 8 mg/dL — ABNORMAL LOW (ref 8.5–10.1)
Chloride: 99 mmol/L (ref 98–107)
Co2: 28 mmol/L (ref 21–32)
Creatinine: 0.94 mg/dL (ref 0.60–1.30)
EGFR (African American): >60
Glucose: 132 mg/dL — ABNORMAL HIGH (ref 65–99)
Potassium: 3.5 mmol/L (ref 3.5–5.1)
Sodium: 134 mmol/L — ABNORMAL LOW (ref 136–145)

## 2011-12-07 ENCOUNTER — Ambulatory Visit: Payer: Self-pay | Admitting: Internal Medicine

## 2012-01-25 ENCOUNTER — Encounter: Payer: Self-pay | Admitting: Neurosurgery

## 2012-01-26 ENCOUNTER — Ambulatory Visit (INDEPENDENT_AMBULATORY_CARE_PROVIDER_SITE_OTHER): Payer: Medicare Other | Admitting: Neurosurgery

## 2012-01-26 ENCOUNTER — Other Ambulatory Visit (INDEPENDENT_AMBULATORY_CARE_PROVIDER_SITE_OTHER): Payer: Medicare Other | Admitting: Vascular Surgery

## 2012-01-26 ENCOUNTER — Encounter: Payer: Self-pay | Admitting: Neurosurgery

## 2012-01-26 ENCOUNTER — Other Ambulatory Visit: Payer: Self-pay | Admitting: *Deleted

## 2012-01-26 VITALS — BP 119/71 | HR 68 | Resp 16 | Ht 66.5 in | Wt 222.0 lb

## 2012-01-26 DIAGNOSIS — I6529 Occlusion and stenosis of unspecified carotid artery: Secondary | ICD-10-CM

## 2012-01-26 NOTE — Progress Notes (Signed)
[facility]  Carot[facility] Office Note  CC: Carotid surveillance Referring Physician: Imogene Burn  History of Present Illness: 78 year old female patient of Dr. Imogene Burn is followed for known carotid stenosis. Patient denies any signs or symptoms of CVA, TIA, amaurosis fugax. The patient states that she has had MRI in the past that showed greater than 80% stenosis only right. She is also had duplex in the past that showed 60-79% range however today correlates with her previous exam in July 2013 when she was in the 40-59% range.  Past Medical History  Diagnosis Date  . HTN (hypertension)   . Diabetes type 1, controlled     whether it was controlled was not specified  . Hyperlipidemia   . Obesity   . Chest pain     previous normal cath 2003. normal Myoview x2  . OSA (obstructive sleep apnea)     CPAP  . Arthritis     Back and knees  . Carotid artery occlusion 2009    ,[place]    ROS: [x]  Positive   [ ]  Denies    General: [ ]  Weight loss, [ ]  Fever, [ ]  chills Neurologic: [ ]  Dizziness, [ ]  Blackouts, [ ]  Seizure [ ]  Stroke, [ ]  "Mini stroke", [ ]  Slurred speech, [ ]  Temporary blindness; [ ]  weakness in arms or legs, [ ]  Hoarseness Cardiac: [ ]  Chest pain/pressure, [ ]  Shortness of breath at rest [ ]  Shortness of breath with exertion, [ ]  Atrial fibrillation or irregular heartbeat Vascular: [ ]  Pain in legs with walking, [ ]  Pain in legs at rest, [ ]  Pain in legs at night,  [ ]  Non-healing ulcer, [ ]  Blood clot in vein/DVT,   Pulmonary: [ ]  Home oxygen, [ ]  Productive cough, [ ]  Coughing up blood, [ ]  Asthma,  [ ]  Wheezing Musculoskeletal:  [ ]  Arthritis, [ ]  Low back pain, [ ]  Joint pain Hematologic: [ ]  Easy Bruising, [ ]  Anemia; [ ]  Hepatitis Gastrointestinal: [ ]  Blood in stool, [ ]  Gastroesophageal Reflux/heartburn, [ ]  Trouble swallowing Urinary: [ ]  chronic Kidney disease, [ ]  on HD - [ ]  MWF or [ ]  TTHS, [ ]  Burning with urination, [ ]  Difficulty  urinating Skin: [ ]  Rashes, [ ]  Wounds Psychological: [ ]  Anxiety, [ ]  Depression   Social History History  Substance Use Topics  . Smoking status: Never Smoker   . Smokeless tobacco: Never Used     Comment: tobacco use - no  . Alcohol Use: No    Family History Family History  Problem Relation Age of Onset  . Heart disease Father     Heart Disease before age 37  . Heart disease Mother     Allergies  Allergen Reactions  . Penicillins Swelling and Rash    Pt. Turn blue  . Phenobarbital Rash    Current Outpatient Prescriptions  Medication Sig Dispense Refill  . albuterol (VENTOLIN HFA) 108 (90 BASE) MCG/ACT inhaler Inhale 2 puffs into the lungs every 6 (six) hours as needed. UAD       . aspirin 81 MG tablet Take 81 mg by mouth daily.      . Azelastine HCl (ASTEPRO NA) Place into the nose.      . carvedilol (COREG) 12.5 MG tablet Take 12.5 mg by mouth 2 (two) times daily.        Marland Kitchen docusate sodium (COLACE) 50 MG capsule Take by mouth 2 (two) times daily.      Marland Kitchen  EDARBYCLOR 40-25 MG TABS daily.      Marland Kitchen esomeprazole (NEXIUM) 40 MG capsule Take 40 mg by mouth daily before breakfast.        . FINACEA 15 % cream Apply topically continuous as needed.       . Fluticasone-Salmeterol (ADVAIR DISKUS) 100-50 MCG/DOSE AEPB Inhale 1 puff into the lungs every 12 (twelve) hours. UAD       . gabapentin (NEURONTIN) 100 MG capsule Take 100 mg by mouth BID times 48H.      Marland Kitchen Glucosamine-Chondroitin 250-200 MG CAPS Take 1 capsule by mouth every morning.        . metFORMIN (GLUCOPHAGE) 500 MG tablet Take 500 mg by mouth daily.        . metroNIDAZOLE (METROGEL) 1 % gel Apply topically daily. UAD       . Multiple Vitamin (MULTIVITAMIN) tablet Take 1 tablet by mouth daily.        . nitroGLYCERIN (NITROSTAT) 0.4 MG SL tablet Place 1 tablet (0.4 mg total) under the tongue every 5 (five) minutes as needed for chest pain.  25 tablet  6  . Specialty Vitamins Products (MAGNESIUM, AMINO ACID CHELATE,) 133 MG  tablet Take 1 tablet by mouth daily.      Marland Kitchen ALPRAZolam (XANAX) 1 MG tablet Take 0.5 mg by mouth at bedtime. Takes 0.5 and 1/2 of pill at night.      . mometasone (NASONEX) 50 MCG/ACT nasal spray Place 2 sprays into the nose daily. UAD at night       . potassium chloride (KLOR-CON) 8 MEQ tablet Take 8 mEq by mouth as directed.      . torsemide (DEMADEX) 10 MG tablet Take 10 mg by mouth daily.      . traMADol (ULTRAM) 50 MG tablet Take 50 mg by mouth as needed.      Marland Kitchen UNABLE TO FIND every morning. Zyflamend         Physical Examination  Filed Vitals:   01/26/12 1414  BP: 119/71  Pulse: 68  Resp:     Body mass index is 35.30 kg/(m^2).  General:  WDWN in NAD Gait: Normal HEENT: WNL Eyes: Pupils equal Pulmonary: normal non-labored breathing , without Rales, rhonchi,  wheezing Cardiac: RRR, without  Murmurs, rubs or gallops; Abdomen: soft, NT, no masses Skin: no rashes, ulcers noted  Vascular Exam Pulses: 3+ radial pulses  Carotid bruits: I do not detect carotid bruits however there is very little flow noted bilaterally Extremities without ischemic changes, no Gangrene , no cellulitis; no open wounds;  Musculoskeletal: no muscle wasting or atrophy   Neurologic: A&O X 3; Appropriate Affect ; SENSATION: normal; MOTOR FUNCTION:  moving all extremities equally. Speech is fluent/normal  Non-Invasive Vascular Imaging CAROTID DUPLEX 01/26/2012  Right ICA 40 - 59 % stenosis Left ICA 20 - 39 % stenosis   ASSESSMENT/PLAN: Asymptomatic patient with a decreasing carotid duplex on the right. However we will keep the patient on six-month surveillance. The patient's questions were encouraged and answered, she is in agreement with this plan.  Lauree Chandler ANP   Clinic MD Lynann Bologna

## 2012-07-26 ENCOUNTER — Other Ambulatory Visit (INDEPENDENT_AMBULATORY_CARE_PROVIDER_SITE_OTHER): Payer: Medicare Other | Admitting: *Deleted

## 2012-07-26 ENCOUNTER — Ambulatory Visit: Payer: Medicare Other | Admitting: Neurosurgery

## 2012-07-26 DIAGNOSIS — I6529 Occlusion and stenosis of unspecified carotid artery: Secondary | ICD-10-CM

## 2012-08-05 ENCOUNTER — Other Ambulatory Visit: Payer: Self-pay | Admitting: *Deleted

## 2012-08-06 ENCOUNTER — Encounter: Payer: Self-pay | Admitting: Surgery

## 2013-01-07 ENCOUNTER — Ambulatory Visit: Payer: Self-pay | Admitting: Internal Medicine

## 2013-02-18 ENCOUNTER — Other Ambulatory Visit: Payer: Self-pay | Admitting: Surgery

## 2013-02-18 DIAGNOSIS — I6529 Occlusion and stenosis of unspecified carotid artery: Secondary | ICD-10-CM

## 2013-04-01 ENCOUNTER — Ambulatory Visit: Payer: Self-pay | Admitting: Internal Medicine

## 2013-04-01 ENCOUNTER — Encounter: Payer: Self-pay | Admitting: Internal Medicine

## 2013-04-02 HISTORY — PX: OTHER SURGICAL HISTORY: SHX169

## 2013-07-31 ENCOUNTER — Encounter: Payer: Self-pay | Admitting: Family

## 2013-08-01 ENCOUNTER — Ambulatory Visit (HOSPITAL_COMMUNITY)
Admission: RE | Admit: 2013-08-01 | Discharge: 2013-08-01 | Disposition: A | Discharge status: Home / Self Care | Payer: Medicare Other | Source: Ambulatory Visit | Attending: Family | Admitting: Family

## 2013-08-01 ENCOUNTER — Encounter: Payer: Self-pay | Admitting: Family

## 2013-08-01 ENCOUNTER — Ambulatory Visit (INDEPENDENT_AMBULATORY_CARE_PROVIDER_SITE_OTHER): Payer: Medicare Other | Admitting: Family

## 2013-08-01 VITALS — BP 145/70 | HR 68 | Resp 16 | Ht 66.5 in | Wt 238.0 lb

## 2013-08-01 DIAGNOSIS — I6529 Occlusion and stenosis of unspecified carotid artery: Secondary | ICD-10-CM | POA: Insufficient documentation

## 2013-08-01 NOTE — Addendum Note (Signed)
Addended by: Mena Goes on: 08/01/2013 02:40 PM   Modules accepted: Orders

## 2013-08-01 NOTE — Progress Notes (Signed)
Established Carotid Patient   History of Present Illness  Amber Welch is a 79 y.o. female patient of Dr. Bridgett Larsson who has known carotid stenosis and returns today for follow up.  Patient has not had previous carotid artery intervention.  Patient has Negative history of TIA or stroke symptom.  The patient denies amaurosis fugax or monocular blindness.  The patient  denies facial drooping.  Pt. denies hemiplegia.  The patient denies receptive or expressive aphasia.    Pt reports New Medical or Surgical History: right foot and ankle surgery for non injury related intermittent  ankle dislocation and other foot problems. She states her blood pressure is better controled on Edarbi. States she has gained weight in the last 3 months since her right foot and ankle surgery.   Pt Diabetic: Yes, in good control, states last A1C was 6.? Pt smoker: non-smoker  Pt meds include: Statin : No: had myalgias from simvastatin ASA: Yes Other anticoagulants/antiplatelets: no   Past Medical History  Diagnosis Date  . HTN (hypertension)   . Diabetes type 1, controlled     whether it was controlled was not specified  . Hyperlipidemia   . Obesity   . Chest pain     previous normal cath 2003. normal Myoview x2  . OSA (obstructive sleep apnea)     CPAP  . Arthritis     Back and knees  . Carotid artery occlusion 2009    [place]    Social History History  Substance Use Topics  . Smoking status: Never Smoker   . Smokeless tobacco: Never Used     Comment: tobacco use - no  . Alcohol Use: No    Family History Family History  Problem Relation Age of Onset  . Heart disease Father     Heart Disease before age 42  . Heart disease Mother     Surgical History Past Surgical History  Procedure Laterality Date  . Right foot surgery    . Right knee surgery      arthroscopy for torn meniscus  . Foot surgery      Left foot, bunionectomy and hammer toe  . Joint replacement  06/05/11    Right  knee replaced  . Joint replacement  09/20/11    Left knee    Allergies  Allergen Reactions  . Penicillins Swelling and Rash    Pt. Turn blue  . Phenobarbital Rash    Current Outpatient Prescriptions  Medication Sig Dispense Refill  . albuterol (VENTOLIN HFA) 108 (90 BASE) MCG/ACT inhaler Inhale 2 puffs into the lungs every 6 (six) hours as needed. UAD       . ALPRAZolam (XANAX) 1 MG tablet Take 0.5 mg by mouth at bedtime. Takes 0.5 and 1/2 of pill at night.      Marland Kitchen aspirin 81 MG tablet Take 81 mg by mouth daily.      . Azelastine HCl (ASTEPRO NA) Place into the nose.      . carvedilol (COREG) 12.5 MG tablet Take 12.5 mg by mouth 2 (two) times daily.        Marland Kitchen docusate sodium (COLACE) 50 MG capsule Take by mouth 2 (two) times daily.      Marland Kitchen EDARBYCLOR 40-25 MG TABS daily.      Marland Kitchen esomeprazole (NEXIUM) 40 MG capsule Take 40 mg by mouth daily before breakfast.        . FINACEA 15 % cream Apply topically continuous as needed.       Marland Kitchen  Fluticasone-Salmeterol (ADVAIR DISKUS) 100-50 MCG/DOSE AEPB Inhale 1 puff into the lungs every 12 (twelve) hours. UAD       . gabapentin (NEURONTIN) 100 MG capsule Take 100 mg by mouth BID times 48H.      Marland Kitchen Glucosamine-Chondroitin 250-200 MG CAPS Take 1 capsule by mouth every morning.        . metFORMIN (GLUCOPHAGE) 500 MG tablet Take 500 mg by mouth daily.        . metroNIDAZOLE (METROGEL) 1 % gel Apply topically daily. UAD       . mometasone (NASONEX) 50 MCG/ACT nasal spray Place 2 sprays into the nose daily. UAD at night       . Multiple Vitamin (MULTIVITAMIN) tablet Take 1 tablet by mouth daily.        . nitroGLYCERIN (NITROSTAT) 0.4 MG SL tablet Place 1 tablet (0.4 mg total) under the tongue every 5 (five) minutes as needed for chest pain.  25 tablet  6  . potassium chloride (KLOR-CON) 8 MEQ tablet Take 8 mEq by mouth as directed.      Marland Kitchen Specialty Vitamins Products (MAGNESIUM, AMINO ACID CHELATE,) 133 MG tablet Take 1 tablet by mouth daily.      Marland Kitchen torsemide  (DEMADEX) 10 MG tablet Take 10 mg by mouth daily.      . traMADol (ULTRAM) 50 MG tablet Take 50 mg by mouth as needed.      Marland Kitchen UNABLE TO FIND every morning. Zyflamend        No current facility-administered medications for this visit.    Review of Systems : See HPI for pertinent positives and negatives.  Physical Examination  Filed Vitals:   08/01/13 1349 08/01/13 1352  BP: 126/61 145/70  Pulse: 67 68  Resp:  16  Height:  5' 6.5" (1.689 m)  Weight:  238 lb (107.956 kg)  SpO2:  98%   Body mass index is 37.84 kg/(m^2).  General: WDWN obese female in NAD GAIT: normal and antalgic Eyes: PERRLA Pulmonary:  Non-labored, CTAB, Negative  Rales, Negative rhonchi, & Negative wheezing.  Cardiac: regular Rhythm ,  Negative detected murmur.  VASCULAR EXAM Carotid Bruits Right Left   Positive, soft Negative   Radial pulses are 2+ palpable and equal.                                                                                                                            LE Pulses Right Left       POPLITEAL  not palpable   not palpable       POSTERIOR TIBIAL  2+ palpable   2+ palpable        DORSALIS PEDIS      ANTERIOR TIBIAL 2+ palpable  2+ palpable     Gastrointestinal: soft, nontender, BS WNL, no r/g,  negative masses.  Musculoskeletal: Negative muscle atrophy/wasting. M/S 5/5 in UE's, 4/5 in LE's, Extremities without ischemic changes.  Neurologic: A&O X 3; Appropriate Affect ;  SENSATION ;normal;  Speech is normal CN 2-12 intact, Pain and light touch intact in extremities, Motor exam as listed above.   Non-Invasive Vascular Imaging CAROTID DUPLEX 08/01/2013   CEREBROVASCULAR DUPLEX EVALUATION    INDICATION: Carotid artery disease    PREVIOUS INTERVENTION(S): None    DUPLEX EXAM: Carotid duplex    RIGHT  LEFT  Peak Systolic Velocities (cm/s) End Diastolic Velocities (cm/s) Plaque LOCATION Peak Systolic Velocities (cm/s) End Diastolic Velocities (cm/s) Plaque   100 9 - CCA PROXIMAL 149 21 -  81 12 - CCA MID 113 20 -  90 14 HT CCA DISTAL 89 18 -  327 33 HT ECA 109 5 -  256 39 HT ICA PROXIMAL 89 28 HT  102 18 - ICA MID 98 31 -  83 18 - ICA DISTAL 109 32 -    3.16 ICA / CCA Ratio (PSV) .96  Antegrade Vertebral Flow Antegrade  532 Brachial Systolic Pressure (mmHg) 992  Triphasic Brachial Artery Waveforms Triphasic    Plaque Morphology:  HM = Homogeneous, HT = Heterogeneous, CP = Calcific Plaque, SP = Smooth Plaque, IP = Irregular Plaque     ADDITIONAL FINDINGS:     IMPRESSION: 1. 40 - 59% right internal carotid artery stenosis 2. Less than 40% left internal carotid artery stenosis.    Compared to the previous exam:  Unable to obtain right diastolic velocity of the internal carotid artery as demonstrated on prior exams.    Assessment: AVRIANNA SMART is a 79 y.o. female who presents with asymptomatic 40 - 59 % Right ICA  Stenosis and <40% in the left ICA. The  ICA stenosis is  Unchanged since carotid Duplex on 01/20/11.  Plan: Follow-up in 1 year with Carotid Duplex scan.   I discussed in depth with the patient the nature of atherosclerosis, and emphasized the importance of maximal medical management including strict control of blood pressure, blood glucose, and lipid levels, obtaining regular exercise, and continued cessation of smoking.  The patient is aware that without maximal medical management the underlying atherosclerotic disease process will progress, limiting the benefit of any interventions. The patient was given information about stroke prevention and what symptoms should prompt the patient to seek immediate medical care. Thank you for allowing [place] to participate in this patient's care.  Clemon Chambers, RN, MSN, FNP-C [facility] Office: [facility] Physician: Bridgett Larsson  08/01/2013 1:37 PM

## 2013-08-01 NOTE — Patient Instructions (Signed)
Stroke Prevention Some medical conditions and behaviors are associated with an increased chance of having a stroke. You may prevent a stroke by making healthy choices and managing medical conditions. HOW CAN I REDUCE MY RISK OF HAVING A STROKE?   Stay physically active. Get at least 30 minutes of activity on most or all days.  Do not smoke. It may also be helpful to avoid exposure to secondhand smoke.  Limit alcohol use. Moderate alcohol use is considered to be:  No more than 2 drinks per day for men.  No more than 1 drink per day for nonpregnant women.  Eat healthy foods. This involves:  Eating 5 or more servings of fruits and vegetables a day.  Making dietary changes that address high blood pressure (hypertension), high cholesterol, diabetes, or obesity.  Manage your cholesterol levels.  Making food choices that are high in fiber and low in saturated fat, trans fat, and cholesterol may control cholesterol levels.  Take any prescribed medicines to control cholesterol as directed by your health care provider.  Manage your diabetes.  Controlling your carbohydrate and sugar intake is recommended to manage diabetes.  Take any prescribed medicines to control diabetes as directed by your health care provider.  Control your hypertension.  Making food choices that are low in salt (sodium), saturated fat, trans fat, and cholesterol is recommended to manage hypertension.  Take any prescribed medicines to control hypertension as directed by your health care provider.  Maintain a healthy weight.  Reducing calorie intake and making food choices that are low in sodium, saturated fat, trans fat, and cholesterol are recommended to manage weight.  Stop drug abuse.  Avoid taking birth control pills.  Talk to your health care provider about the risks of taking birth control pills if you are over 35 years old, smoke, get migraines, or have ever had a blood clot.  Get evaluated for sleep  disorders (sleep apnea).  Talk to your health care provider about getting a sleep evaluation if you snore a lot or have excessive sleepiness.  Take medicines only as directed by your health care provider.  For some people, aspirin or blood thinners (anticoagulants) are helpful in reducing the risk of forming abnormal blood clots that can lead to stroke. If you have the irregular heart rhythm of atrial fibrillation, you should be on a blood thinner unless there is a good reason you cannot take them.  Understand all your medicine instructions.  Make sure that other conditions (such as anemia or atherosclerosis) are addressed. SEEK IMMEDIATE MEDICAL CARE IF:   You have sudden weakness or numbness of the face, arm, or leg, especially on one side of the body.  Your face or eyelid droops to one side.  You have sudden confusion.  You have trouble speaking (aphasia) or understanding.  You have sudden trouble seeing in one or both eyes.  You have sudden trouble walking.  You have dizziness.  You have a loss of balance or coordination.  You have a sudden, severe headache with no known cause.  You have new chest pain or an irregular heartbeat. Any of these symptoms may represent a serious problem that is an emergency. Do not wait to see if the symptoms will go away. Get medical help at once. Call your local emergency services (911 in [place] Do not drive yourself to the hospital. Document Released: 01/27/2004 Document Revised: 05/05/2013 Document Reviewed: 06/21/2012 [employer] Patient Information 2015 [employer]. This information is not intended to replace advice given   to you by your health care provider. Make sure you discuss any questions you have with your health care provider.  

## 2014-01-08 ENCOUNTER — Ambulatory Visit: Payer: Self-pay | Admitting: Internal Medicine

## 2014-04-21 NOTE — Op Note (Signed)
PATIENT NAME:  Amber Welch, Amber Welch MR#:  [id number] DATE OF BIRTH:  [birth year]  DATE OF PROCEDURE:  09/20/2011  PREOPERATIVE DIAGNOSIS: Degenerative arthrosis of the left knee.   POSTOPERATIVE DIAGNOSIS:  Degenerative arthrosis of the left knee.   PROCEDURE PERFORMED: Left total knee arthroplasty using computer-assisted navigation.   SURGEON: Skip Estimable, M.D.   ASSISTANT: Vance Peper, PA-C (required to maintain retraction throughout the procedure)   ANESTHESIA:  Femoral nerve block and spinal.  ESTIMATED BLOOD LOSS: 50 mL.   FLUIDS REPLACED: 1000 mL of crystalloid.   TOURNIQUET TIME: 92 minutes.   DRAINS: Two medium drains to reinfusion system.   SOFT TISSUE RELEASES: Anterior cruciate ligament, posterior cruciate ligament, deep and superficial medial collateral ligament, and patellofemoral ligament.   IMPLANTS UTILIZED: DePuy PFC Sigma size five posterior stabilized femoral component (cemented), size four MBT tibial component (cemented), 38-mm three peg oval dome patella (cemented), and a 10-mm stabilized rotating platform polyethylene insert.   INDICATIONS FOR SURGERY: The patient is a 80 year old female who has been seen for complaints of progressive left knee pain. X-rays demonstrated severe degenerative changes in tricompartmental fashion. The patient had previously undergone successful right total knee arthroplasty. Risks and benefits of surgical intervention were discussed with the patient. She expressed her understanding of the risks and benefits and agreed with plans for surgical intervention.   PROCEDURE IN DETAIL: The patient was brought into the operating room and, after adequate femoral nerve block and spinal anesthesia was achieved, a tourniquet was placed on the patient's upper left thigh. The patient's left knee and leg were cleaned and prepped with alcohol and DuraPrep and draped in the usual sterile fashion. A "time-out" was performed as per usual protocol. The left lower  extremity was exsanguinated using an Esmarch and the tourniquet was inflated to 300 mmHg. An anterior longitudinal incision was made followed by a standard mid vastus approach. A moderate effusion was evacuated. The deep fibers of the medial collateral ligament were elevated in a subperiosteal fashion off the medial flare of the tibia so as to maintain a continuous soft tissue sleeve. The patella was subluxed laterally and the patellofemoral ligament was incised. Inspection of the knee demonstrated severe degenerative changes involving medial and lateral compartments as well as patellofemoral compartment. Osteophytes were debrided using a rongeur. Anterior and posterior cruciate ligaments were excised. The two 4-mm Schanz pins were inserted into the femur and into the tibia for attachment of the array of trackers used for computer-assisted navigation. Hip center was identified using a circumduction technique. Distal landmarks were mapped using the computer. The distal femur and proximal tibia were mapped using the computer. The distal femoral cutting guide was positioned using computer-assisted navigation so as to achieve a 5-degree distal valgus cut. Cut was performed and verified using the computer. The proximal femur was sized and it was felt that a size five femoral component was appropriate. A size five cutting guide was positioned and the anterior cut was performed and verified using the computer. This was followed by completion of the posterior and chamfer cuts. Femoral cutting guide for the central box was then positioned and the central box cut was performed. Attention was then directed to the proximal tibia. Medial and lateral menisci were excised. The extramedullary tibial cutting guide was positioned using computer-assisted navigation so as to achieve 0-degree varus valgus alignment and 0-degree posterior slope. Cut was performed and verified using the computer. The proximal tibia was sized and it was  felt that a size four  tray was appropriate. Tibial and femoral trials were inserted followed by insertion of a 10-mm polyethylene insert. The knee was felt to be somewhat tight medially. A Cobb elevator was used to elevate the superficial fibers of the medial collateral ligament. This allowed for excellent medial and lateral soft tissue balancing both in full extension and in flexion. Finally, the patella was cut and prepared so as to accommodate a 38-mm three peg oval dome patella. Patellar trial was placed and the knee was placed through a range of motion with excellent patellar tracking appreciated. The femoral trial was removed. Central post hole for the tibial component was reamed followed by insertion of a keel punch. Tibial tray was then removed. The cut surfaces of bone were irrigated with copious amounts of normal saline with antibiotic solution using pulsatile lavage and then suctioned dry. Polymethyl methacrylate cement with gentamicin was prepared in the usual fashion using a vacuum mixer. Cement was applied to the cut surface of the proximal tibia as well as along the undersurface of a size four MBT tibial component. The tibial component was positioned and impacted into place. Excess cement was removed using [employer] fast streamer. Cement was then applied to the cut surfaces of the femur as well as along the posterior flanges of a size five posterior stabilized femoral component. Femoral component was positioned and impacted into place. Excess cement was removed using [employer] fast streamer. A 10-mm polyethylene trial was inserted and the knee was brought into full extension with steady axial compression applied. Finally, cement was applied to the backside of a 38-mm three peg oval dome patella and the patellar component was positioned and patellar clamp applied. Excess cement was removed using Feer elevators.   After adequate curing of the cement, the tourniquet was deflated after a total tourniquet time of 92  minutes. Hemostasis was achieved using electrocautery. The knee was irrigated with copious amounts of normal saline with antibiotic solution using pulsatile lavage and then suctioned dry. The knee was inspected for any residual cement debris. 30 mL of 0.25% Marcaine with epinephrine was injected along the posterior capsule. A 10-mm stabilized rotating platform polyethylene insert was inserted and the knee was placed through a range of motion. Excellent medial and lateral soft tissue balancing was appreciated and excellent patellar tracking was noted.   Two medium drains were placed in the wound bed and brought out through a separate stab incision to be attached to a reinfusion system. The medial parapatellar portion of the incision was reapproximated using #1 Vicryl. The subcutaneous tissue was approximated in layers using first #0 Vicryl followed by #2-0 Vicryl. Skin was closed with skin staples. A sterile dressing was applied.   The patient tolerated the procedure well. She was transported to the recovery room in stable condition.     ____________________________ Laurice Record. Holley Bouche., MD jph:bjt D: 09/21/2011 00:22:10 ET T: 09/21/2011 13:00:55 ET JOB#: [id number]  cc: Laurice Record. Holley Bouche., MD, <Dictator> JAMES P Holley Bouche MD ELECTRONICALLY SIGNED 09/26/2011 20:38

## 2014-04-21 NOTE — Discharge Summary (Signed)
PATIENT NAME:  Amber Welch, Amber Welch MR#:  [id number] DATE OF BIRTH:  [birth year]  DATE OF ADMISSION:  09/20/2011 DATE OF DISCHARGE:  09/23/2011  ADMITTING DIAGNOSIS: Degenerative arthrosis of left knee.  DISCHARGE DIAGNOSES: Degenerative arthrosis of the left knee.  HISTORY: The patient is a pleasant 80 year old female who has been followed at [facility] for progression of left knee pain. The patient had localized most of  her pain along the medial aspect of the knee. Her pain was noted to be aggravated with weightbearing activities.  On occasion she had noticed some swelling in the knee.  She had denied any gross locking of the knee but on occasion did have some near giving way.  The knee pain had progressed to the point that it was significantly interfering with her activities of daily living. After discussion of the risks and benefits of surgical intervention, the patient expressed her understanding of the risks and benefits and agreed for plans for surgical intervention.   HOSPITAL COURSE:  PROCEDURE: Left total knee arthroplasty using computer-assisted navigation.    ANESTHESIA: Femoral nerve block with spinal.  IMPLANTS UTILIZED: DePuy PFC Sigma size 5 posterior stabilized femoral component (cemented), size 4 MBP tibial component (cemented), 38-mm 3 peg oval dome patella (cemented), and a 10 mm stabilized rotating platform polyethylene insert.   The patient tolerated the procedure very well.  She had no complications.  She was then taken to PACU where she was stabilized and then transferred to the orthopedic floor. She began receiving anticoagulation therapy of Lovenox 30 mg subcutaneous q.12h. per anesthesia and pharmacy protocol. She was fitted with TED stockings bilaterally.  These were allowed to be removed 1 hour per 8 hour shift.  She was also fitted with the AV-I compression foot pumps set at 80 mmHg. Her calves have been nontender.  There has been no evidence of any DVTs. Negative Homans  sign.   The patient's vital signs have been stable. She has been afebrile.  Hemodynamically she was stable.  No transfusions were given other than the Autovac transfusions given the 1st 6 hours postoperatively.   The patient's IV Foley and Hemovac were all discontinued on that day, too, along with a dressing change. The wound was free of any drainage or signs of  infection. The TED stocking was applied to the left leg following removal of the Hemovac and dressing change. The Polar Care was reapplied to the surgical site, maintain temperature of 40 to 50 degrees Fahrenheit.    Physical therapy was initiated on day 1 for gait training and transfers. Upon being discharged she was ambulating greater than 200 feet, was able to go up and down 4 steps.  Was independent with bed to chair transfers.  Occupational therapy was also initiated on day 1 for ADLs and assistive devices.    The patient is being discharged to home in improved, stable condition. She may weight bear as tolerated.  Continue using a walker until cleared by Physical Therapy to go to a quad cane.  She is to continue with TED stockings. These are allowed to be removed at night but are to be worn during the day. She was instructed on wound care. She will receive home health PT.  She has a followup appointment in 2 weeks in the clinic. She is to call the clinic sooner if any temperatures of 101.5 or greater, excessive bleeding.  She was placed on a regular diet.  She is to resume her regular medications that she was  on prior to admission.  She was given a prescription for Roxicodone,  dispense 80, 1 to 2 tablets q.4 to 6 hours p.r.n. for pain, Ultram 50 mg 1 to 2 tablets q. 4 to 6 hours for pain, dispense 60 with 1 refill, Lovenox 40 mg subcutaneously q. day for 14 days and then discontinue, begin taking one 81 mg enteric-coated aspirin.    PAST MEDICAL HISTORY: Seasonal allergies, asthma, chickenpox, arthritis, diabetes, depression, hypertension,  hyperlipidemia, right carotid artery stenosis, sleep apnea, reflux esophagitis, colonic polyps, obesity.   ____________________________ Vance Peper, PA jrw:vtd D: 09/23/2011 07:40:51 ET T: 09/25/2011 12:48:18 ET JOB#: [id number]  cc: Vance Peper, PA, <Dictator> Domenique Quest PA ELECTRONICALLY SIGNED 09/25/2011 14:10

## 2014-04-26 NOTE — Discharge Summary (Signed)
PATIENT NAME:  Amber Welch, Amber Welch MR#:  [id number] DATE OF BIRTH:  1934/05/16  DATE OF ADMISSION:  06/05/2011 DATE OF DISCHARGE:  06/08/2011  ADMITTING DIAGNOSIS: Degenerative arthrosis of the right knee.   DISCHARGE DIAGNOSES:  1. Degenerative arthrosis of right knee. 2. Hypokalemia.   CONSULTATIONS:  Dr. Emily Filbert  HISTORY: The patient is a 80 year old who has been followed at [facility] for progression of right knee pain. The patient had reported a several year history of bilateral knee pain with the right being more symptomatic than the left. She had received cortisone injections as well as Synvisc injections with only temporary improvement in her condition. The patient states that the right knee pain had increased in severity. Her pain was noted to be aggravated with weight-bearing activities. She had localized most of the pain along the medial aspect of the knee. She did report some swelling of the knee. There was also some near giving-way. On occasion she was using a cane for ambulation. X-rays taken in the [facility] orthopedic department showed narrowing of the cartilage space best visualized on the lateral view. Subchondral sclerosis was noted as well as degenerative changes to the patellofemoral articulation. After discussion of the risks and benefits of surgical intervention, the patient expressed her understanding of the risks and benefits and agreed for plans for surgical intervention.   PROCEDURE: Right total knee arthroplasty using computer-assisted navigation.   ANESTHESIA: Femoral nerve block with spinal.   SOFT TISSUE RELEASE: Anterior cruciate ligament, posterior cruciate ligament, deep medial collateral ligaments, as well as the patellofemoral ligament.   IMPLANTS UTILIZED: DePuy PFC Sigma size five posterior stabilized femoral component (cemented), size four MBT tibial component (cemented), 38-mm three pegged oval dome patella (cemented), and a 10-mm stabilized  rotating platform polyethylene insert.   HOSPITAL COURSE: The patient tolerated the procedure very well. She had no complications. She was then taken to the PAC-U where she was stabilized and then transferred to the orthopedic floor. The patient began receiving anticoagulation therapy of Lovenox 40 mg subcutaneous q.12 hours per anesthesia and pharmacy protocol. She was fitted with TED stockings bilaterally. These are allowed to be removed one hour per eight-hour shift. The right one was applied on day two following removal of the Hemovac and dressing change. She was also fitted with the AV-I compression foot pumps bilaterally set at 80 mmHg. Her calves have been nontender, free of any evidence of any deep venous thromboses. Negative Homans sign. Heels were elevated off the bed using rolled towels.   The patient has denied any chest pains or shortness of breath. Vital signs have been stable. She has been afebrile. Hemodynamically she was stable and no transfusions were given other than the Autovac transfusion given the first six hours postoperatively. Her sodium and potassium had decreased following surgery and this was supplemented with 40 mg b.i.d. Upon being discharged it  was 3.7. Sodium was still slightly low, but she was not having any nausea or any symptoms. This was improving at time of transfer. Otherwise her labs were unremarkable.   Physical therapy was initiated on day one for gait training and transfers. Upon being discharged, she was ambulating greater than 160 feet. She was able to go up and down four sets of steps. She was independent with bed to chair transfers. Occupational therapy was also initiated on day one for activities of daily living and assistive devices.   The patient's IV, Hemovac, and Foley were discontinued on day two along with dressing  change. The Polar Care was reapplied to the surgical leg maintaining a temperature of 40 to 50 degrees Fahrenheit. Wound was free of any  drainage or any signs of infection.   DISPOSITION: The patient is being discharged to skilled nursing facility in improved stable condition.   DISCHARGE INSTRUCTIONS:  1. She may weight bear as tolerated.  2. Continue TED stockings. These are to be worn around the clock but may be removed one hour per eight-hour shift.  3. Incentive spirometry every one hour while awake.  4. Encourage cough, deep breathing every two hours while awake.  5. Polar Care to the surgical leg maintaining a temperature of 40 to 50 degrees Fahrenheit.  6. Physical therapy for gait training and transfers. 7. Occupational therapy for activities of daily living and assistive devices.  8. She is placed on an 1800-calorie ADA diet.  9. Elevate heels off the bed.  10. She has a follow-up appointment with Vance Peper, PA on 06/18 and  Dr. Marry Guan on 07/18/2011 at 9:00 a.m. She is to call the clinic sooner if any temperatures of 101.5 or greater.   DRUG ALLERGIES: Cardura, Celebrex, Inderal, intravenous pyelogram dye, lisinopril, Norpramin.   MEDICATIONS:  1. Tylenol ES 500 to 1000 mg every four hours p.r.n.  2. Roxicodone 5 to 10 mg every 4 to 6 hours p.r.n. for pain.  3. Tramadol 50 to 100 mg every four hours.  4. Dulcolax suppository 10 mg rectally daily p.r.n.  5. Milk of Magnesia 30 mL b.i.d. p.r.n.  6. Enema soapsuds if no results  with Milk of Magnesia or Dulcolax.  7. Mylanta DS 30 mL every six hours p.r.n.  8. Senokot-S 1 tablet b.i.d.  9. Insulin sliding scale, Novolin R injections. 10. Nexium 40 mg daily.  11. Neurontin 200 mg at bedtime.  12. Nasonex nasal spray, two sprays both nostrils daily. 13. Astelin nasal spray, two sprays both nostrils usual schedule. 14. Albuterol oral inhaler 2 puffs while awake every four hours p.r.n. Opti-chamber.  15. Multivitamin capsule 1 tablet daily.  16. Coreg 12.5 mg b.i.d.  17. Advair Diskus 100/50 inhaler 1 puff inhalation b.i.d.  18. MetroGel 0.75% applied to  affected area as needed. 19. Edarbyclor 40/25, the patient's own medicine.  20. Glucophage 500 mg with breakfast.  21. Lovenox 40 mg subcutaneous b.i.d. for 14 days, then discontinue and begin taking one 81-mg enteric-coated aspirin per day.  22. Alprazolam 0.25 mg every six hours p.r.n. for anxiety.   PAST MEDICAL HISTORY:  1. Seasonal allergies.  2. Asthma.  3. Chickenpox.  4. Arthritis.  5. Diabetes.  6. Depression.  7. Hypertension.  8. Hyperlipidemia.  9. Right carotid artery stenosis.  10. Sleep apnea.  11. Reflux esophagitis.  12. Colonic polyps.  13. Obesity.    ____________________________ Vance Peper, PA [id number]:bjt D: 06/08/2011 07:27:00 ET T: 06/08/2011 08:26:11 ET JOB#: [id number]  cc: Vance Peper, PA, <Dictator> Cayla Wiegand PA ELECTRONICALLY SIGNED 06/09/2011 8:02

## 2014-04-26 NOTE — Consult Note (Signed)
PATIENT NAME:  Amber Welch, Amber Welch MR#:  [id number] DATE OF BIRTH:  July 08, 1934  DATE OF CONSULTATION:  06/05/2011  REFERRING PHYSICIAN:   CONSULTING PHYSICIAN:  Tracie Harrier, MD  HISTORY OF PRESENT ILLNESS: The patient is a 80 year old female with a history of labile hypertension, type II diabetes, obstructive sleep apnea, hyperlipidemia, gastroesophageal reflux disease, obesity, and carotid stenosis who underwent right total knee arthroplasty using computer-assisted navigation by Dr. Marry Guan for degenerative osteoarthrosis of the right knee. The patient has a history of labile hypertension and her blood pressure today was elevated at 190/81. She did receive her Edarbyclor earlier this morning and also one dose of. Carvedilol. However, her blood pressure did come down to 133/70 after she received a second dose of Carvedilol earlier this evening. The patient denies any chest pain. No shortness of breath. No fevers. No chills. She did have some postoperative pain which has since improved.   PAST MEDICAL HISTORY:  1. Osteoarthritis. 2. Obstructive sleep apnea. 3. Type II diabetes. 4. Hyperlipidemia. 5. Hypertension. 6. Allergic rhinitis. 7. Reflux esophagitis. 8. History of obesity. 9. History of PACs. 10. Carotid stenosis. 11. Asthma.   PAST SURGICAL HISTORY:  1. D and C x2. 2. Left bunionectomy. 3. Bilateral hammertoes.  4. Right knee arthroscopy.  5. Today underwent right total knee arthroplasty.   CURRENT MEDICATIONS:  1. Advair Diskus 100/50 one puff b.i.d.  2. Aspirin 325 mg a day.  3. Carvedilol 12.5 mg b.i.d.  4. Metformin 500 mg once a day.  5. Xanax 0.5 half a tablet at bedtime. 6. Edarbyclor 40/25 one tab once a day.  7. Nasonex nasal spray two sprays in both nostrils once a day.  8. Torsemide 10 mg once a day.  9. Klor-Con 8 mEq once a day.  10. Hydrocodone 5/325 one tablet q.6 hours p.r.n.  11. Neurontin 100 mg 1 to 2 capsules at bedtime.  12. Nexium 40 mg 1 capsule a  day.  13. Ventolin HFA 2 puffs q.i.d. p.r.n.    FAMILY HISTORY: Positive for diabetes and CAD in her dad. Mother had a history of hypertension and CAD.   SOCIAL HISTORY: The patient denies any use of tobacco or alcohol.   PHYSICAL EXAMINATION:   VITAL SIGNS: Temperature 97.8, pulse 82, respirations 20, blood pressure 133/70, oxygen sat 96% on room air.   HEENT: Normocephalic, atraumatic. Right carotid bruit noted.   HEART: S1, S2 regular rhythm.   LUNGS: Clear to auscultation.   ABDOMEN: Soft, mildly distended. Bowel sounds heard.   EXTREMITIES: Evidence of right TKA. Trace edema.   NEUROLOGIC: Nonfocal   LABORATORY, DIAGNOSTIC, AND RADIOLOGICAL DATA: Labs pending. Blood sugar 140.    IMPRESSION AND RECOMMENDATIONS:  1. Labile hypertension. The patient is currently on Edarbyclor and Carvedilol. Will continue current regimen for blood pressure. Elevated blood pressure most likely is secondary to pain. Can use p.r.n. hydralazine for accelerated hypertension but for now will continue to observe.  2. Asthma. The patient is currently on Advair Diskus.  3. Type II diabetes, on insulin sliding scale and metformin.  4. Sleep apnea. Continue CPAP as at home.  5. History of right TKA. Continue sub-Q Lovenox.  6. Gastroesophageal reflux disease, on Nexium.   The patient is clinically stable and blood pressure is near baseline levels. Will check CBC, electrolytes, BUN, and creatinine in a.m.   ____________________________ Tracie Harrier, MD vh:drc D: 06/05/2011 17:44:23 ET T: 06/06/2011 10:00:59 ET JOB#: [id number]  cc: Tracie Harrier, MD, <Dictator> Tracie Harrier MD ELECTRONICALLY SIGNED  06/15/2011 13:01 

## 2014-04-26 NOTE — Op Note (Signed)
PATIENT NAME:  Amber Welch, Amber Welch MR#:  [id number] DATE OF BIRTH:  1934/06/04  DATE OF PROCEDURE:  06/05/2011  PREOPERATIVE DIAGNOSIS: Degenerative arthrosis of the right knee.   POSTOPERATIVE DIAGNOSIS: Degenerative arthrosis of the right knee.   PROCEDURE PERFORMED: Right total knee arthroplasty using computer-assisted navigation.   SURGEON: Laurice Record. Holley Bouche., MD   ASSISTANT: Vance Peper, PA-C (required to maintain retraction throughout the procedure)   ANESTHESIA: Femoral nerve block and spinal.   ESTIMATED BLOOD LOSS: 100 mL.   FLUIDS REPLACED: 1800 mL of Crystalloid.   TOURNIQUET TIME: 100 minutes.   DRAINS: Two medium drains to reinfusion system.   SOFT TISSUE RELEASES: Anterior cruciate ligament, posterior cruciate ligament, deep medial collateral ligament, and patellofemoral ligament.   IMPLANTS UTILIZED: DePuy PFC Sigma size 5 posterior stabilized femoral component (cemented), size 4 MBT tibial component (cemented), 38 mm three peg oval dome patella (cemented), and a 10 mm stabilized rotating platform polyethylene insert.   INDICATIONS FOR SURGERY: The patient is a 80 year old female who has been seen for complaints of progressive right knee pain and loss of range of motion. X-rays demonstrated severe degenerative changes in tricompartmental fashion. After discussion of the risks and benefits of surgical intervention, the patient expressed her understanding of the risks and benefits and agreed with plans for surgical intervention.   PROCEDURE IN DETAIL: The patient was brought into the operating room and, after adequate femoral nerve block and spinal anesthesia was achieved, a tourniquet was placed on the patient's upper right thigh. The patient's right knee and leg were cleaned and prepped with alcohol and DuraPrep and draped in the usual sterile fashion. A "time-out" was performed as per usual protocol. The right lower extremity was exsanguinated using an Esmarch and the  tourniquet was inflated to 300 mmHg. An anterior longitudinal incision was made followed by a standard mid vastus approach. A large effusion was evacuated. The deep fibers of the medial collateral ligament were elevated in a subperiosteal fashion off of the medial flare of the tibia so as to maintain a continuous soft tissue sleeve. Patella was subluxed laterally and the patellofemoral ligament was excised. Inspection of the knee demonstrated severe degenerative changes in a tricompartmental fashion. Prominent osteophytes were debrided using a rongeur. Anterior and posterior cruciate ligaments were excised. Two 4.0 mm pins were inserted into the femur and into the tibia for attachment of the array of trackers used for computer-assisted navigation. Hip center was identified using circumduction technique. Distal landmarks were mapped using the computer. Distal femur and proximal tibia were mapped using the computer. Distal femoral cutting guide was positioned using computer-assisted navigation so as to achieve a 5 degree distal valgus cut. Cut was performed and verified using the computer. Distal femur was sized and it was felt that a size 5 femoral component was appropriate. Size 5 cutting guide was positioned and the anterior cut was performed and verified using the computer. This was followed by completion of the posterior and chamfer cuts. Femoral cutting guide for central box was then positioned and central box cut was performed.   Attention was then directed to the proximal tibia. Medial and lateral menisci were excised. The extramedullary tibial cutting guide was positioned using computer-assisted navigation so as to achieve 0 degree varus valgus alignment and 0 degree posterior slope. Cut was performed and verified using the computer. Proximal tibia was sized and it was felt that a size 4 tibial tray was appropriate. Tibial and femoral trials were inserted followed by insertion  of a 10 mm polyethylene trial.  Excellent mediolateral soft tissue balancing was appreciated both in full extension and in 90 degrees of flexion. Finally, patella was cut and prepared so as to accommodate a 38 mm three peg oval dome patella. Patellar trial was placed and the knee was placed through a range of motion with excellent patellar tracking appreciated.   Femoral trial was removed. Central post hole for the tibial component was reamed followed by insertion of a keel punch. Tibial tray was then removed. Cut surfaces of bone were irrigated with copious amounts of normal saline with antibiotic solution using pulsatile lavage and suctioned dry. Polymethyl methacrylate cement with gentamicin was prepared in the usual fashion using a vacuum mixer. Gentamicin cement was used due to the patient's history of diabetes. Cement was applied to the cut surface of the proximal tibia as well as along the undersurface of size 4 MBT tibial component. Tibial component was positioned and impacted into place. Excess cement was removed using freer elevators. Cement was then applied to the cut surface of the femur as well as along the posterior flanges of size 5 posterior stabilized femoral component. Femoral component was positioned and impacted into place. Excess cement was removed using freer elevators. A 10 mm polyethylene trial was inserted and the knee was brought into full extension with steady axial compression applied. Finally, cement was applied to the backside of a 38 mm three peg oval dome patella and patellar component was positioned and patellar clamp applied. Excess cement was removed using freer elevators.   After adequate curing of cement, the tourniquet was deflated after total tourniquet time of 100 minutes. Hemostasis was achieved using electrocautery. The knee was irrigated with copious amounts of normal saline with antibiotic solution using pulsatile lavage and then suctioned dry. The knee was inspected for any residual cement debris.  30 mL of 0.25% Marcaine with epinephrine was injected along the posterior capsule. A 10 mm stabilized rotating platform polyethylene insert was inserted and the knee was placed through a range of motion. Excellent patellar tracking was appreciated and excellent mediolateral soft tissue balancing was appreciated. Two medium drains were placed in the wound bed and brought out through a separate stab incision to be attached to a reinfusion system. The medial parapatellar portion of the incision was reapproximated using interrupted sutures of #1 Vicryl. Subcutaneous tissue was approximated in layers using first #0 Vicryl followed by #2-0 Vicryl. Skin was closed with skin staples. A sterile dressing was applied.   The patient tolerated the procedure well. She was transported to the recovery room in stable condition.   ____________________________ Laurice Record. Holley Bouche., MD jph:drc D: 06/05/2011 11:05:06 ET T: 06/05/2011 11:19:00 ET JOB#: [id number]  cc: Jeneen Rinks P. Holley Bouche., MD, <Dictator> JAMES P Holley Bouche MD ELECTRONICALLY SIGNED 06/06/2011 20:29

## 2014-07-31 ENCOUNTER — Encounter
Admission: RE | Disposition: A | Discharge status: Home / Self Care | Payer: Self-pay | Source: Ambulatory Visit | Attending: Gastroenterology

## 2014-07-31 ENCOUNTER — Ambulatory Visit: Payer: Medicare Other | Admitting: Certified Registered"

## 2014-07-31 ENCOUNTER — Encounter: Payer: Self-pay | Admitting: *Deleted

## 2014-07-31 ENCOUNTER — Ambulatory Visit
Admission: RE | Admit: 2014-07-31 | Discharge: 2014-07-31 | Disposition: A | Discharge status: Home / Self Care | Payer: Medicare Other | Source: Ambulatory Visit | Attending: Gastroenterology | Admitting: Gastroenterology

## 2014-07-31 DIAGNOSIS — Z86010 Personal history of colonic polyps: Secondary | ICD-10-CM | POA: Diagnosis present

## 2014-07-31 DIAGNOSIS — I10 Essential (primary) hypertension: Secondary | ICD-10-CM | POA: Insufficient documentation

## 2014-07-31 DIAGNOSIS — E119 Type 2 diabetes mellitus without complications: Secondary | ICD-10-CM | POA: Insufficient documentation

## 2014-07-31 HISTORY — PX: COLONOSCOPY WITH PROPOFOL: SHX5780

## 2014-07-31 LAB — GLUCOSE, CAPILLARY: GLUCOSE-CAPILLARY: 103 mg/dL — AB (ref 65–99)

## 2014-07-31 SURGERY — COLONOSCOPY WITH PROPOFOL
Anesthesia: General

## 2014-07-31 MED ORDER — PROPOFOL INFUSION 10 MG/ML OPTIME
INTRAVENOUS | Status: DC | PRN
  Administered 2014-07-31: 100 ug/kg/min via INTRAVENOUS

## 2014-07-31 MED ORDER — NITROGLYCERIN 0.4 MG SL SUBL: 0.4000 mg | SUBLINGUAL_TABLET | SUBLINGUAL | Status: DC | PRN

## 2014-07-31 MED ORDER — PROPOFOL 10 MG/ML IV BOLUS
INTRAVENOUS | Status: DC | PRN
  Administered 2014-07-31: 50 mg via INTRAVENOUS

## 2014-07-31 MED ORDER — SODIUM CHLORIDE 0.9 % IV SOLN
INTRAVENOUS | Status: DC
  Administered 2014-07-31: 1000 mL via INTRAVENOUS

## 2014-07-31 MED ORDER — LIDOCAINE HCL (CARDIAC) 20 MG/ML IV SOLN
INTRAVENOUS | Status: DC | PRN
Start: 2014-07-31 — End: 2014-07-31
  Administered 2014-07-31: 50 mg via INTRAVENOUS

## 2014-07-31 NOTE — Transfer of Care (Signed)
Immediate Anesthesia Transfer of Care Note  Patient: NYSHA KOPLIN  Procedure(s) Performed: Procedure(s): COLONOSCOPY WITH PROPOFOL (N/A)  Patient Location: Endoscopy Unit  Anesthesia Type:General  Level of Consciousness: awake  Airway & Oxygen Therapy: Patient Spontanous Breathing and Patient connected to nasal cannula oxygen  Post-op Assessment: Report given to RN  Post vital signs: Reviewed  Last Vitals:  Filed Vitals:   07/31/14 0954  BP: 130/64  Pulse: 62  Temp: 36.2 C  Resp: 15    Complications: No apparent anesthesia complications

## 2014-07-31 NOTE — Anesthesia Postprocedure Evaluation (Signed)
  Anesthesia Post-op Note  Patient: Amber Welch  Procedure(s) Performed: Procedure(s): COLONOSCOPY WITH PROPOFOL (N/A)  Anesthesia type:General  Patient location: PACU  Post pain: Pain level controlled  Post assessment: Post-op Vital signs reviewed, Patient's Cardiovascular Status Stable, Respiratory Function Stable, Patent Airway and No signs of Nausea or vomiting  Post vital signs: Reviewed and stable  Last Vitals:  Filed Vitals:   07/31/14 1015  BP: 139/73  Pulse: 66  Temp:   Resp: 15    Level of consciousness: awake, alert  and patient cooperative  Complications: No apparent anesthesia complications

## 2014-07-31 NOTE — Anesthesia Preprocedure Evaluation (Signed)
Anesthesia Evaluation  Patient identified by MRN, date of birth, ID band Patient awake    Reviewed: Allergy & Precautions, H&P , NPO status , Patient's Chart, lab work & pertinent test results, reviewed documented beta blocker date and time   Airway Mallampati: III  TM Distance: >3 FB Neck ROM: limited    Dental  (+) Poor Dentition   Pulmonary shortness of breath, sleep apnea ,  breath sounds clear to auscultation  Pulmonary exam normal       Cardiovascular Exercise Tolerance: Good hypertension, + Peripheral Vascular Disease Normal cardiovascular examRhythm:regular Rate:Normal     Neuro/Psych negative neurological ROS  negative psych ROS   GI/Hepatic negative GI ROS, Neg liver ROS,   Endo/Other  diabetes  Renal/GU negative Renal ROS  negative genitourinary   Musculoskeletal negative musculoskeletal ROS (+) Arthritis -,   Abdominal   Peds negative pediatric ROS (+)  Hematology negative hematology ROS (+)   Anesthesia Other Findings Past Medical History:   HTN (hypertension)                                           Diabetes type 1, controlled                                    Comment:whether it was controlled was not specified   Hyperlipidemia                                               Obesity                                                      Chest pain                                                     Comment:previous normal cath 2003. normal Myoview x2   OSA (obstructive sleep apnea)                                  Comment:CPAP   Arthritis                                                      Comment:Back and knees   Carotid artery occlusion                        2009           Comment:[place]   Reproductive/Obstetrics negative OB ROS                             Anesthesia Physical Anesthesia Plan  ASA: III  Anesthesia Plan: General   Post-op Pain Management:     Induction:   Airway Management Planned:   Additional Equipment:   Intra-op Plan:   Post-operative Plan:   Informed Consent: I have reviewed the patients History and Physical, chart, labs and discussed the procedure including the risks, benefits and alternatives for the proposed anesthesia with the patient or authorized representative who has indicated his/her understanding and acceptance.     Plan Discussed with: Anesthesiologist, CRNA and Surgeon  Anesthesia Plan Comments: (Patient consented that she is at increased risk for stroke 2/2 to carotid artery stenosis.  Patient and husband voiced understanding. )        Anesthesia Quick Evaluation

## 2014-07-31 NOTE — Op Note (Signed)
[facility] Patient Name: Amber Welch Procedure Date: 07/31/2014 9:34 AM MRN: [id number] Account #: [id number] Date of Birth: [birth year] Admit Type: Outpatient Age: 80 Room: [facility] ENDO ROOM 4 Gender: Female Note Status: Finalized Procedure:         Colonoscopy Indications:       Personal history of colonic polyps Providers:         Lupita Dawn. Candace Cruise, MD Referring MD:      Rusty Aus, MD (Referring MD) Medicines:         Monitored Anesthesia Care Complications:     No immediate complications. Procedure:         Pre-Anesthesia Assessment:                    - Prior to the procedure, a History and Physical was                     performed, and patient medications, allergies and                     sensitivities were reviewed. The patient's tolerance of                     previous anesthesia was reviewed.                    - The risks and benefits of the procedure and the sedation                     options and risks were discussed with the patient. All                     questions were answered and informed consent was obtained.                    - After reviewing the risks and benefits, the patient was                     deemed in satisfactory condition to undergo the procedure.                    After obtaining informed consent, the colonoscope was                     passed under direct vision. Throughout the procedure, the                     patient's blood pressure, pulse, and oxygen saturations                     were monitored continuously. The Colonoscope was                     introduced through the anus and advanced to the the cecum,                     identified by appendiceal orifice and ileocecal valve. The                     colonoscopy was performed without difficulty. The patient                     tolerated the procedure well. The quality of the bowel  preparation was fair. Findings:      The colon  (entire examined portion) appeared normal. Impression:        - The entire examined colon is normal.                    - No specimens collected. Recommendation:    - Discharge patient to home.                    - The findings and recommendations were discussed with the                     patient. Procedure Code(s): --- Professional ---                    [phone], Colonoscopy, flexible; diagnostic, including                     collection of specimen(s) by brushing or washing, when                     performed (separate procedure) Diagnosis Code(s): --- Professional ---                    Z86.010, Personal history of colonic polyps CPT copyright 2014 [employer]. All rights reserved. The codes documented in this report are preliminary and upon coder review may  be revised to meet current compliance requirements. Hulen Luster, MD 07/31/2014 9:52:02 AM This report has been signed electronically. Number of Addenda: 0 Note Initiated On: 07/31/2014 9:34 AM Scope Withdrawal Time: 0 hours 5 minutes 9 seconds  Total Procedure Duration: 0 hours 10 minutes 30 seconds       [facility]

## 2014-07-31 NOTE — H&P (Signed)
  Date of Initial H&P: 07/10/2014 History reviewed, patient examined, no change in status, stable for surgery.

## 2014-08-03 ENCOUNTER — Encounter: Payer: Self-pay | Admitting: Gastroenterology

## 2014-08-06 ENCOUNTER — Encounter: Payer: Self-pay | Admitting: Family

## 2014-08-07 ENCOUNTER — Ambulatory Visit (HOSPITAL_COMMUNITY)
Admission: RE | Admit: 2014-08-07 | Discharge: 2014-08-07 | Disposition: A | Discharge status: Home / Self Care | Payer: Medicare Other | Source: Ambulatory Visit | Attending: Family | Admitting: Family

## 2014-08-07 ENCOUNTER — Encounter: Payer: Self-pay | Admitting: Family

## 2014-08-07 ENCOUNTER — Ambulatory Visit (INDEPENDENT_AMBULATORY_CARE_PROVIDER_SITE_OTHER): Payer: Medicare Other | Admitting: Family

## 2014-08-07 VITALS — BP 152/68 | HR 66 | Temp 96.9°F | Resp 16 | Ht 66.0 in | Wt 224.0 lb

## 2014-08-07 DIAGNOSIS — I6523 Occlusion and stenosis of bilateral carotid arteries: Secondary | ICD-10-CM | POA: Diagnosis not present

## 2014-08-07 NOTE — Progress Notes (Signed)
Established Carotid Patient   History of Present Illness  Amber Welch is a 80 y.o. female patient of Dr. Bridgett Larsson who has known carotid artery stenosis and returns today for follow up.  Patient has not had previous carotid artery intervention.  She has no history of TIA or stroke symptoms. Specifically the patient denies a history of amaurosis fugax or monocular blindness, unilateral facial drooping, hemiplegia, or receptive or expressive aphasia.   Pt has generalized arthritis.  Pt reports New Medical or Surgical History: right foot and ankle surgery for non injury related intermittent ankle dislocation and other foot problems. Recent normal colonoscopy She states her blood pressure is better controled on Edarbi.  Pt Diabetic: Yes, in good control, states last A1C was 6.? Pt smoker: non-smoker  Pt meds include: Statin : No: had myalgias from simvastatin ASA: Yes Other anticoagulants/antiplatelets: no   Past Medical History  Diagnosis Date  . HTN (hypertension)   . Diabetes type 1, controlled     whether it was controlled was not specified  . Hyperlipidemia   . Obesity   . Chest pain     previous normal cath 2003. normal Myoview x2  . OSA (obstructive sleep apnea)     CPAP  . Arthritis     Back and knees  . Carotid artery occlusion 2009    [place]    Social History History  Substance Use Topics  . Smoking status: Never Smoker   . Smokeless tobacco: Never Used     Comment: tobacco use - no  . Alcohol Use: No    Family History Family History  Problem Relation Age of Onset  . Heart disease Father     Heart Disease before age 39  . Diabetes Father   . Hyperlipidemia Father   . Heart attack Father   . Heart disease Mother   . Hypertension Mother   . Heart attack Mother     Surgical History Past Surgical History  Procedure Laterality Date  . Right foot surgery Right April 2015    Right Foot and Ankle  . Right knee surgery      arthroscopy for torn  meniscus  . Foot surgery      Left foot, bunionectomy and hammer toe  . Joint replacement  06/05/11    Right knee replaced  . Joint replacement  09/20/11    Left knee  . Dilation and curettage of uterus    . Colonoscopy with propofol N/A 07/31/2014    Procedure: COLONOSCOPY WITH PROPOFOL;  Surgeon: Hulen Luster, MD;  Location: [facility] ENDOSCOPY;  Service: Gastroenterology;  Laterality: N/A;    Allergies  Allergen Reactions  . Penicillins Swelling and Rash    Pt. Turn blue  . Ace Inhibitors   . Celebrex [Celecoxib]   . Iodinated Diagnostic Agents   . Norpramin [Desipramine]   . Sinequan [Doxepin]   . Statins   . Phenobarbital Rash    Current Outpatient Prescriptions  Medication Sig Dispense Refill  . albuterol (VENTOLIN HFA) 108 (90 BASE) MCG/ACT inhaler Inhale 2 puffs into the lungs every 6 (six) hours as needed. UAD     . ALPRAZolam (XANAX) 1 MG tablet Take 0.5 mg by mouth at bedtime. Takes 0.5 and 1/2 of pill at night.    Marland Kitchen aspirin 81 MG tablet Take 81 mg by mouth daily.    . Azelastine HCl (ASTEPRO NA) Place into the nose.    . Azilsartan Medoxomil (EDARBI) 80 MG TABS Take by mouth.    Marland Kitchen  carvedilol (COREG) 12.5 MG tablet Take 12.5 mg by mouth 2 (two) times daily.      Marland Kitchen docusate sodium (COLACE) 50 MG capsule Take by mouth 2 (two) times daily.    Marland Kitchen EDARBYCLOR 40-25 MG TABS daily.    Marland Kitchen esomeprazole (NEXIUM) 40 MG capsule Take 40 mg by mouth daily before breakfast.      . FINACEA 15 % cream Apply topically continuous as needed.     . Fluticasone-Salmeterol (ADVAIR DISKUS) 100-50 MCG/DOSE AEPB Inhale 1 puff into the lungs every 12 (twelve) hours. UAD     . gabapentin (NEURONTIN) 100 MG capsule Take 100 mg by mouth 3 (three) times daily. Pt is taking 3 100 mg at night before bedtime. Pt. Can take up to 6 Tabs per day.    . Glucosamine-Chondroitin 250-200 MG CAPS Take 1 capsule by mouth every morning.      . Melatonin 5 MG CAPS Take by mouth at bedtime as needed.    . metFORMIN  (GLUCOPHAGE) 500 MG tablet Take 500 mg by mouth daily.      . metroNIDAZOLE (METROGEL) 1 % gel Apply topically daily. UAD     . mometasone (NASONEX) 50 MCG/ACT nasal spray Place 2 sprays into the nose daily. UAD at night     . Multiple Vitamin (MULTIVITAMIN) tablet Take 1 tablet by mouth daily.      . nitroGLYCERIN (NITROSTAT) 0.4 MG SL tablet Place 1 tablet (0.4 mg total) under the tongue every 5 (five) minutes as needed for chest pain. 25 tablet 6  . potassium chloride (KLOR-CON) 8 MEQ tablet Take 8 mEq by mouth as directed.    Marland Kitchen Specialty Vitamins Products (MAGNESIUM, AMINO ACID CHELATE,) 133 MG tablet Take 1 tablet by mouth daily.    Marland Kitchen torsemide (DEMADEX) 10 MG tablet Take 10 mg by mouth daily.    . traMADol (ULTRAM) 50 MG tablet Take 50 mg by mouth as needed.    Marland Kitchen UNABLE TO FIND every morning. Zyflamend      No current facility-administered medications for this visit.    Review of Systems : See HPI for pertinent positives and negatives.  Physical Examination  Filed Vitals:   08/07/14 1357 08/07/14 1359 08/07/14 1401  BP: 151/68 136/61 152/68  Pulse: 66 66 66  Temp: 96.9 F (36.1 C)    Resp: 16    Height: 5\' 6"  (1.676 m)    Weight: 224 lb (101.606 kg)    SpO2: 99%     Body mass index is 36.17 kg/(m^2).   General: WDWN obese female in NAD GAIT:  antalgic Eyes: PERRLA Pulmonary: Non-labored, CTAB, Negative Rales, Negative rhonchi, & Negative wheezing.  Cardiac: regular Rhythm, no detected murmur.  VASCULAR EXAM Carotid Bruits Right Left   Positive Negative   Radial pulses are 2+ palpable and equal.      LE Pulses Right Left   POPLITEAL not palpable  not palpable   POSTERIOR TIBIAL 2+ palpable  2+ palpable    DORSALIS PEDIS  ANTERIOR TIBIAL 2+ palpable  2+ palpable     Gastrointestinal: soft,  nontender, BS WNL, no r/g,no palpable masses.  Musculoskeletal: Negative muscle atrophy/wasting. M/S 5/5 in UE's, 4/5 in LE's, Extremities without ischemic changes.  Neurologic: A&O X 3; Appropriate Affect, Speech is normal CN 2-12 intact, Pain and light touch intact in extremities, Motor exam as listed above.           Non-Invasive Vascular Imaging CAROTID DUPLEX 08/07/2014   CEREBROVASCULAR DUPLEX EVALUATION  INDICATION: Carotid artery disease     PREVIOUS INTERVENTION(S):     DUPLEX EXAM:     RIGHT  LEFT  Peak Systolic Velocities (cm/s) End Diastolic Velocities (cm/s) Plaque LOCATION Peak Systolic Velocities (cm/s) End Diastolic Velocities (cm/s) Plaque  100 10  CCA PROXIMAL 112 19   79 10  CCA MID 90 15   96 12 HT CCA DISTAL 83 20   262 29 HT ECA 115 11   153 28 HT ICA PROXIMAL 86 22 HT  88 19  ICA MID 97 31   74 21  ICA DISTAL 112 25     1.93 ICA / CCA Ratio (PSV) 1.24  Antegrade  Vertebral Flow Antegrade   836 Brachial Systolic Pressure (mmHg) 629  Multiphasic (Subclavian artery) Brachial Artery Waveforms Multiphasic (Subclavian artery)    Plaque Morphology:  HM = Homogeneous, HT = Heterogeneous, CP = Calcific Plaque, SP = Smooth Plaque, IP = Irregular Plaque  ADDITIONAL FINDINGS: Stenosis of the right bifurcation without extension into the internal carotid artery.    IMPRESSION: Bilateral internal carotid artery velocities suggest a <40% stenosis.     Compared to the previous exam:        Assessment: Amber Welch is a 80 y.o. female who has no history of stroke or TIA. Today's carotid Duplex suggests bilateral internal carotid artery velocities with <40% stenosis.    Plan: Follow-up in 1 year with Carotid Duplex.   I discussed in depth with the patient the nature of atherosclerosis, and emphasized the importance of maximal medical management including strict control of blood pressure, blood glucose, and lipid levels, obtaining regular exercise, and  continued cessation of smoking.  The patient is aware that without maximal medical management the underlying atherosclerotic disease process will progress, limiting the benefit of any interventions. The patient was given information about stroke prevention and what symptoms should prompt the patient to seek immediate medical care. Thank you for allowing [place] to participate in this patient's care.  Clemon Chambers, RN, MSN, FNP-C [facility] Office: [phone]  Clinic Physician: Bridgett Larsson  08/07/2014 1:45 PM

## 2014-08-07 NOTE — Patient Instructions (Signed)
Stroke Prevention Some medical conditions and behaviors are associated with an increased chance of having a stroke. You may prevent a stroke by making healthy choices and managing medical conditions. HOW CAN I REDUCE MY RISK OF HAVING A STROKE?   Stay physically active. Get at least 30 minutes of activity on most or all days.  Do not smoke. It may also be helpful to avoid exposure to secondhand smoke.  Limit alcohol use. Moderate alcohol use is considered to be:  No more than 2 drinks per day for men.  No more than 1 drink per day for nonpregnant women.  Eat healthy foods. This involves:  Eating 5 or more servings of fruits and vegetables a day.  Making dietary changes that address high blood pressure (hypertension), high cholesterol, diabetes, or obesity.  Manage your cholesterol levels.  Making food choices that are high in fiber and low in saturated fat, trans fat, and cholesterol may control cholesterol levels.  Take any prescribed medicines to control cholesterol as directed by your health care provider.  Manage your diabetes.  Controlling your carbohydrate and sugar intake is recommended to manage diabetes.  Take any prescribed medicines to control diabetes as directed by your health care provider.  Control your hypertension.  Making food choices that are low in salt (sodium), saturated fat, trans fat, and cholesterol is recommended to manage hypertension.  Take any prescribed medicines to control hypertension as directed by your health care provider.  Maintain a healthy weight.  Reducing calorie intake and making food choices that are low in sodium, saturated fat, trans fat, and cholesterol are recommended to manage weight.  Stop drug abuse.  Avoid taking birth control pills.  Talk to your health care provider about the risks of taking birth control pills if you are over 35 years old, smoke, get migraines, or have ever had a blood clot.  Get evaluated for sleep  disorders (sleep apnea).  Talk to your health care provider about getting a sleep evaluation if you snore a lot or have excessive sleepiness.  Take medicines only as directed by your health care provider.  For some people, aspirin or blood thinners (anticoagulants) are helpful in reducing the risk of forming abnormal blood clots that can lead to stroke. If you have the irregular heart rhythm of atrial fibrillation, you should be on a blood thinner unless there is a good reason you cannot take them.  Understand all your medicine instructions.  Make sure that other conditions (such as anemia or atherosclerosis) are addressed. SEEK IMMEDIATE MEDICAL CARE IF:   You have sudden weakness or numbness of the face, arm, or leg, especially on one side of the body.  Your face or eyelid droops to one side.  You have sudden confusion.  You have trouble speaking (aphasia) or understanding.  You have sudden trouble seeing in one or both eyes.  You have sudden trouble walking.  You have dizziness.  You have a loss of balance or coordination.  You have a sudden, severe headache with no known cause.  You have new chest pain or an irregular heartbeat. Any of these symptoms may represent a serious problem that is an emergency. Do not wait to see if the symptoms will go away. Get medical help at once. Call your local emergency services (911 in [place] Do not drive yourself to the hospital. Document Released: 01/27/2004 Document Revised: 05/05/2013 Document Reviewed: 06/21/2012 [employer] Patient Information 2015 [employer]. This information is not intended to replace advice given   to you by your health care provider. Make sure you discuss any questions you have with your health care provider.  

## 2014-08-07 NOTE — Progress Notes (Signed)
Filed Vitals:   08/07/14 1357 08/07/14 1359 08/07/14 1401  BP: 151/68 136/61 152/68  Pulse: 66 66 66  Temp: 96.9 F (36.1 C)    Resp: 16    Height: 5\' 6"  (1.676 m)    Weight: 224 lb (101.606 kg)    SpO2: 99%

## 2014-08-10 NOTE — Addendum Note (Signed)
Addended by: Dorthula Rue L on: 08/10/2014 04:15 PM   Modules accepted: Orders

## 2014-10-10 DIAGNOSIS — Z96653 Presence of artificial knee joint, bilateral: Secondary | ICD-10-CM | POA: Insufficient documentation

## 2014-12-08 ENCOUNTER — Other Ambulatory Visit: Payer: Self-pay | Admitting: Internal Medicine

## 2014-12-08 DIAGNOSIS — Z1231 Encounter for screening mammogram for malignant neoplasm of breast: Secondary | ICD-10-CM

## 2014-12-24 ENCOUNTER — Institutional Professional Consult (permissible substitution): Payer: Medicare Other | Admitting: Internal Medicine

## 2015-01-08 ENCOUNTER — Encounter: Payer: Self-pay | Admitting: Internal Medicine

## 2015-01-08 ENCOUNTER — Ambulatory Visit (INDEPENDENT_AMBULATORY_CARE_PROVIDER_SITE_OTHER): Payer: Medicare Other | Admitting: Internal Medicine

## 2015-01-08 VITALS — BP 132/68 | HR 67 | Ht 66.5 in | Wt 227.2 lb

## 2015-01-08 DIAGNOSIS — J449 Chronic obstructive pulmonary disease, unspecified: Secondary | ICD-10-CM | POA: Insufficient documentation

## 2015-01-08 MED ORDER — UMECLIDINIUM BROMIDE 62.5 MCG/INH IN AEPB: 1.0000 | INHALATION_SPRAY | Freq: Every day | RESPIRATORY_TRACT | Status: DC

## 2015-01-08 MED ORDER — FLUTICASONE FUROATE-VILANTEROL 100-25 MCG/INH IN AEPB: 1.0000 | INHALATION_SPRAY | Freq: Every day | RESPIRATORY_TRACT | Status: AC

## 2015-01-08 MED ORDER — ALBUTEROL SULFATE HFA 108 (90 BASE) MCG/ACT IN AERS: 2.0000 | INHALATION_SPRAY | RESPIRATORY_TRACT | Status: AC | PRN

## 2015-01-08 MED ORDER — UMECLIDINIUM BROMIDE 62.5 MCG/INH IN AEPB: 1.0000 | INHALATION_SPRAY | Freq: Every day | RESPIRATORY_TRACT | Status: AC

## 2015-01-08 MED ORDER — FLUTICASONE FUROATE-VILANTEROL 100-25 MCG/INH IN AEPB: 1.0000 | INHALATION_SPRAY | Freq: Every day | RESPIRATORY_TRACT | Status: DC

## 2015-01-08 NOTE — Patient Instructions (Addendum)
Chronic Obstructive Pulmonary Disease Chronic obstructive pulmonary disease (COPD) is a common lung condition in which airflow from the lungs is limited. COPD is a general term that can be used to describe many different lung problems that limit airflow, including both chronic bronchitis and emphysema. If you have COPD, your lung function will probably never return to normal, but there are measures you can take to improve lung function and make yourself feel better. CAUSES   Smoking (common).  Exposure to secondhand smoke.  Genetic problems.  Chronic inflammatory lung diseases or recurrent infections. SYMPTOMS  Shortness of breath, especially with physical activity.  Deep, persistent (chronic) cough with a large amount of thick mucus.  Wheezing.  Rapid breaths (tachypnea).  Gray or bluish discoloration (cyanosis) of the skin, especially in your fingers, toes, or lips.  Fatigue.  Weight loss.  Frequent infections or episodes when breathing symptoms become much worse (exacerbations).  Chest tightness. DIAGNOSIS Your health care provider will take a medical history and perform a physical examination to diagnose COPD. Additional tests for COPD may include:  Lung (pulmonary) function tests.  Chest X-ray.  CT scan.  Blood tests. TREATMENT  Treatment for COPD may include:  Inhaler and nebulizer medicines. These help manage the symptoms of COPD and make your breathing more comfortable.  Supplemental oxygen. Supplemental oxygen is only helpful if you have a low oxygen level in your blood.  Exercise and physical activity. These are beneficial for nearly all people with COPD.  Lung surgery or transplant.  Nutrition therapy to gain weight, if you are underweight.  Pulmonary rehabilitation. This may involve working with a team of health care providers and specialists, such as respiratory, occupational, and physical therapists. HOME CARE INSTRUCTIONS  Take all medicines  (inhaled or pills) as directed by your health care provider.  Avoid over-the-counter medicines or cough syrups that dry up your airway (such as antihistamines) and slow down the elimination of secretions unless instructed otherwise by your health care provider.  If you are a smoker, the most important thing that you can do is stop smoking. Continuing to smoke will cause further lung damage and breathing trouble. Ask your health care provider for help with quitting smoking. He or she can direct you to community resources or hospitals that provide support.  Avoid exposure to irritants such as smoke, chemicals, and fumes that aggravate your breathing.  Use oxygen therapy and pulmonary rehabilitation if directed by your health care provider. If you require home oxygen therapy, ask your health care provider whether you should purchase a pulse oximeter to measure your oxygen level at home.  Avoid contact with individuals who have a contagious illness.  Avoid extreme temperature and humidity changes.  Eat healthy foods. Eating smaller, more frequent meals and resting before meals may help you maintain your strength.  Stay active, but balance activity with periods of rest. Exercise and physical activity will help you maintain your ability to do things you want to do.  Preventing infection and hospitalization is very important when you have COPD. Make sure to receive all the vaccines your health care provider recommends, especially the pneumococcal and influenza vaccines. Ask your health care provider whether you need a pneumonia vaccine.  Learn and use relaxation techniques to manage stress.  Learn and use controlled breathing techniques as directed by your health care provider. Controlled breathing techniques include:  Pursed lip breathing. Start by breathing in (inhaling) through your nose for 1 second. Then, purse your lips as if you were   going to whistle and breathe out (exhale) through the  pursed lips for 2 seconds.  Diaphragmatic breathing. Start by putting one hand on your abdomen just above your waist. Inhale slowly through your nose. The hand on your abdomen should move out. Then purse your lips and exhale slowly. You should be able to feel the hand on your abdomen moving in as you exhale.  Learn and use controlled coughing to clear mucus from your lungs. Controlled coughing is a series of short, progressive coughs. The steps of controlled coughing are: 1. Lean your head slightly forward. 2. Breathe in deeply using diaphragmatic breathing. 3. Try to hold your breath for 3 seconds. 4. Keep your mouth slightly open while coughing twice. 5. Spit any mucus out into a tissue. 6. Rest and repeat the steps once or twice as needed. SEEK MEDICAL CARE IF:  You are coughing up more mucus than usual.  There is a change in the color or thickness of your mucus.  Your breathing is more labored than usual.  Your breathing is faster than usual. SEEK IMMEDIATE MEDICAL CARE IF:  You have shortness of breath while you are resting.  You have shortness of breath that prevents you from:  Being able to talk.  Performing your usual physical activities.  You have chest pain lasting longer than 5 minutes.  Your skin color is more cyanotic than usual.  You measure low oxygen saturations for longer than 5 minutes with a pulse oximeter. MAKE SURE YOU:  Understand these instructions.  Will watch your condition.  Will get help right away if you are not doing well or get worse.   STOP ADVAIR START BREO and START INCRUSE

## 2015-01-08 NOTE — Progress Notes (Signed)
[facility] Consultation      Date: 01/08/2015,   MRN# [id number] Amber Welch [birth year] Code Status:  Hosp day:@LENGTHOFSTAYDAYS @ Referring MD: @ATDPROV @     PCP:      AdmissionWeight: 227 lb 3.2 oz (103.057 kg)                 CurrentWeight: 227 lb 3.2 oz (103.057 kg) Amber Welch is a 81 y.o. old female seen in consultation for SOB,wheezing, DOE  at the request of Dr Emily Filbert     CHIEF COMPLAINT:   Wheezing, SOB, DOE    HISTORY OF PRESENT ILLNESS   81 yo white female seen today for symptoms of DOE, SOB and wheezing that has been going on for several months Patient feels chest tightness with exertion that is relieved by taking off her bra, she states that she associated wheezing with her symtpoms Patient was told by previous Pulmonologist that she has ASTHMA, one other person told her she has COPD I have reviewed PFT's with patient that were performed 1 year ago Patient does not take advair as prescribed, takes albuterol as needed sometimes    Fev/FVC ratio: 66% Fev1 92% FEF25/75 39% FLow volume loops suggest airway obstruction DLCO 67%   Patient is nonsmoker, exposed to wood stove smoke for approx 25 years and second hand smoke for approx 10 years Patient has NO lower ext swelling, patient does not have any signs of infection at this time She has dx of OSA and is compliant with CPAP Patient has seen cardiology in the past for abnormal  heart beat Other [id number] that she has intermnittent diizziness-no other neuro symptoms  PAST MEDICAL HISTORY   Past Medical History  Diagnosis Date  . HTN (hypertension)   . Diabetes type 1, controlled ([place])     whether it was controlled was not specified  . Hyperlipidemia   . Obesity   . Chest pain     previous normal cath 2003. normal Myoview x2  . OSA (obstructive sleep apnea)     CPAP  . Arthritis     Back and knees  . Carotid artery occlusion 2009    [place]  . COPD (chronic  obstructive pulmonary disease) ([place])      SURGICAL HISTORY   Past Surgical History  Procedure Laterality Date  . Right foot surgery Right April 2015    Right Foot and Ankle  . Right knee surgery      arthroscopy for torn meniscus  . Foot surgery      Left foot, bunionectomy and hammer toe  . Joint replacement  06/05/11    Right knee replaced  . Joint replacement  09/20/11    Left knee  . Dilation and curettage of uterus    . Colonoscopy with propofol N/A 07/31/2014    Procedure: COLONOSCOPY WITH PROPOFOL;  Surgeon: Hulen Luster, MD;  Location: [facility] ENDOSCOPY;  Service: Gastroenterology;  Laterality: N/A;     FAMILY HISTORY   Family History  Problem Relation Age of Onset  . Heart disease Father     Heart Disease before age 40  . Diabetes Father   . Hyperlipidemia Father   . Heart attack Father   . Heart disease Mother   . Hypertension Mother   . 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 Smoking status:  Never Smoker   . Smokeless tobacco: Never Used     Comment: tobacco use - no  . Alcohol Use: No   Wood stove smoke and second hand smoke expo  MEDICATIONS    Home Medication:  Current Outpatient Rx  Name  Route  Sig  Dispense  Refill  . albuterol (VENTOLIN HFA) 108 (90 Base) MCG/ACT inhaler   Inhalation   Inhale 2 puffs into the lungs every 4 (four) hours as needed for wheezing. UAD   1 Inhaler   12     Patient to call you if she needs refill   . ALPRAZolam (XANAX) 1 MG tablet   Oral   Take 0.5 mg by mouth at bedtime. Takes 0.5 and 1/2 of pill at night.         Marland Kitchen aspirin 81 MG tablet   Oral   Take 81 mg by mouth daily.         . Azelastine HCl (ASTEPRO NA)   Nasal   Place into the nose.         . Azilsartan Medoxomil (EDARBI) 80 MG TABS   Oral   Take by mouth.         . carvedilol (COREG) 12.5 MG tablet   Oral   Take  12.5 mg by mouth 2 (two) times daily.           Marland Kitchen docusate sodium (COLACE) 50 MG capsule   Oral   Take by mouth 2 (two) times daily.         Marland Kitchen EDARBYCLOR 40-25 MG TABS      daily.         Marland Kitchen esomeprazole (NEXIUM) 40 MG capsule   Oral   Take 40 mg by mouth daily before breakfast.           . FINACEA 15 % cream   Topical   Apply topically continuous as needed.          . Fluticasone-Salmeterol (ADVAIR DISKUS) 100-50 MCG/DOSE AEPB   Inhalation   Inhale 1 puff into the lungs every 12 (twelve) hours. UAD          . gabapentin (NEURONTIN) 100 MG capsule   Oral   Take 100 mg by mouth 3 (three) times daily. Pt is taking 3 100 mg at night before bedtime. Pt. Can take up to 6 Tabs per day.         . Glucosamine-Chondroitin 250-200 MG CAPS   Oral   Take 1 capsule by mouth every morning.           . Melatonin 5 MG CAPS   Oral   Take by mouth at bedtime as needed.         . metFORMIN (GLUCOPHAGE) 500 MG tablet   Oral   Take 500 mg by mouth daily.           . metroNIDAZOLE (METROGEL) 1 % gel   Topical   Apply topically daily. UAD          . mometasone (NASONEX) 50 MCG/ACT nasal spray   Nasal   Place 2 sprays into the nose daily. UAD at night          . nitrofurantoin, macrocrystal-monohydrate, (MACROBID) 100 MG capsule               . nitroGLYCERIN (NITROSTAT) 0.4 MG SL tablet   Sublingual   Place 1 tablet (0.4 mg total) under the tongue every 5 (five) minutes as needed for  chest pain.   25 tablet   6   . potassium chloride (KLOR-CON) 8 MEQ tablet   Oral   Take 8 mEq by mouth as directed.         Marland Kitchen Specialty Vitamins Products (MAGNESIUM, AMINO ACID CHELATE,) 133 MG tablet   Oral   Take 1 tablet by mouth daily.         Marland Kitchen torsemide (DEMADEX) 10 MG tablet   Oral   Take 10 mg by mouth daily.         Marland Kitchen UNABLE TO FIND      every morning. Zyflamend          . Fluticasone Furoate-Vilanterol (BREO ELLIPTA) 100-25 MCG/INH AEPB    Inhalation   Inhale 1 puff into the lungs daily.   14 each   0   . Fluticasone Furoate-Vilanterol (BREO ELLIPTA) 100-25 MCG/INH AEPB   Inhalation   Inhale 1 puff into the lungs daily.   60 each   5   . Umeclidinium Bromide (INCRUSE ELLIPTA) 62.5 MCG/INH AEPB   Inhalation   Inhale 1 puff into the lungs daily.   7 each   0   . Umeclidinium Bromide (INCRUSE ELLIPTA) 62.5 MCG/INH AEPB   Inhalation   Inhale 1 puff into the lungs daily.   30 each   5     Current Medication:  Current outpatient prescriptions:  .  albuterol (VENTOLIN HFA) 108 (90 Base) MCG/ACT inhaler, Inhale 2 puffs into the lungs every 4 (four) hours as needed for wheezing. UAD, Disp: 1 Inhaler, Rfl: 12 .  ALPRAZolam (XANAX) 1 MG tablet, Take 0.5 mg by mouth at bedtime. Takes 0.5 and 1/2 of pill at night., Disp: , Rfl:  .  aspirin 81 MG tablet, Take 81 mg by mouth daily., Disp: , Rfl:  .  Azelastine HCl (ASTEPRO NA), Place into the nose., Disp: , Rfl:  .  Azilsartan Medoxomil (EDARBI) 80 MG TABS, Take by mouth., Disp: , Rfl:  .  carvedilol (COREG) 12.5 MG tablet, Take 12.5 mg by mouth 2 (two) times daily.  , Disp: , Rfl:  .  docusate sodium (COLACE) 50 MG capsule, Take by mouth 2 (two) times daily., Disp: , Rfl:  .  EDARBYCLOR 40-25 MG TABS, daily., Disp: , Rfl:  .  esomeprazole (NEXIUM) 40 MG capsule, Take 40 mg by mouth daily before breakfast.  , Disp: , Rfl:  .  FINACEA 15 % cream, Apply topically continuous as needed. , Disp: , Rfl:  .  Fluticasone-Salmeterol (ADVAIR DISKUS) 100-50 MCG/DOSE AEPB, Inhale 1 puff into the lungs every 12 (twelve) hours. UAD , Disp: , Rfl:  .  gabapentin (NEURONTIN) 100 MG capsule, Take 100 mg by mouth 3 (three) times daily. Pt is taking 3 100 mg at night before bedtime. Pt. Can take up to 6 Tabs per day., Disp: , Rfl:  .  Glucosamine-Chondroitin 250-200 MG CAPS, Take 1 capsule by mouth every morning.  , Disp: , Rfl:  .  Melatonin 5 MG CAPS, Take by mouth at bedtime as needed., Disp:  , Rfl:  .  metFORMIN (GLUCOPHAGE) 500 MG tablet, Take 500 mg by mouth daily.  , Disp: , Rfl:  .  metroNIDAZOLE (METROGEL) 1 % gel, Apply topically daily. UAD , Disp: , Rfl:  .  mometasone (NASONEX) 50 MCG/ACT nasal spray, Place 2 sprays into the nose daily. UAD at night , Disp: , Rfl:  .  nitrofurantoin, macrocrystal-monohydrate, (MACROBID) 100 MG capsule, , Disp: , Rfl:  .  nitroGLYCERIN (NITROSTAT) 0.4 MG SL tablet, Place 1 tablet (0.4 mg total) under the tongue every 5 (five) minutes as needed for chest pain., Disp: 25 tablet, Rfl: 6 .  potassium chloride (KLOR-CON) 8 MEQ tablet, Take 8 mEq by mouth as directed., Disp: , Rfl:  .  Specialty Vitamins Products (MAGNESIUM, AMINO ACID CHELATE,) 133 MG tablet, Take 1 tablet by mouth daily., Disp: , Rfl:  .  torsemide (DEMADEX) 10 MG tablet, Take 10 mg by mouth daily., Disp: , Rfl:  .  UNABLE TO FIND, every morning. Zyflamend , Disp: , Rfl:  .  Fluticasone Furoate-Vilanterol (BREO ELLIPTA) 100-25 MCG/INH AEPB, Inhale 1 puff into the lungs daily., Disp: 14 each, Rfl: 0 .  Fluticasone Furoate-Vilanterol (BREO ELLIPTA) 100-25 MCG/INH AEPB, Inhale 1 puff into the lungs daily., Disp: 60 each, Rfl: 5 .  Umeclidinium Bromide (INCRUSE ELLIPTA) 62.5 MCG/INH AEPB, Inhale 1 puff into the lungs daily., Disp: 7 each, Rfl: 0 .  Umeclidinium Bromide (INCRUSE ELLIPTA) 62.5 MCG/INH AEPB, Inhale 1 puff into the lungs daily., Disp: 30 each, Rfl: 5    ALLERGIES   Penicillins; Ace inhibitors; Celebrex; Iodinated diagnostic agents; Norpramin; Sinequan; Statins; and Phenobarbital     REVIEW OF SYSTEMS   Review of Systems  Constitutional: Positive for malaise/fatigue. Negative for fever, chills and weight loss.  Eyes: Negative for blurred vision.  Respiratory: Positive for shortness of breath and wheezing. Negative for cough, hemoptysis and sputum production.   Cardiovascular: Negative for chest pain.  Gastrointestinal: Negative for nausea, vomiting and abdominal  pain.  Genitourinary: Negative for flank pain.  Musculoskeletal: Negative.   Skin: Negative for rash.  Neurological: Positive for dizziness. Negative for tingling, tremors and headaches.  Endo/Heme/Allergies: Negative.   Psychiatric/Behavioral: Negative.   All other systems reviewed and are negative.    VS: BP 132/68 mmHg  Pulse 67  Ht 5' 6.5" (1.689 m)  Wt 227 lb 3.2 oz (103.057 kg)  BMI 36.13 kg/m2  SpO2 98%     PHYSICAL EXAM  Physical Exam  Constitutional: She is oriented to person, place, and time. She appears well-developed and well-nourished. No distress.  HENT:  Head: Normocephalic and atraumatic.  Mouth/Throat: No oropharyngeal exudate.  Eyes: EOM are normal. Pupils are equal, round, and reactive to light. No scleral icterus.  Neck: Normal range of motion. Neck supple.  Cardiovascular: Normal rate, regular rhythm and normal heart sounds.   No murmur heard. Pulmonary/Chest: No stridor. No respiratory distress. She has no wheezes.  Abdominal: Soft. Bowel sounds are normal.  Musculoskeletal: Normal range of motion. She exhibits no edema.  Neurological: She is alert and oriented to person, place, and time. She displays normal reflexes. Coordination normal.  Skin: Skin is warm. She is not diaphoretic.  Psychiatric: She has a normal mood and affect.       ASSESSMENT/PLAN   81 yo white female with chronic exposure to wood stove smoke with second hand smoke exposure with PFT suggestive of obstructive airways and clinical signs of COPD Grade A asscocaited with underlying OSA  1.will stop advair 2.start Breo for long standing COPD and Incruse for acute bronchospasms 3.will need to check PFT with 6 MWT 4.check over night puls eox 5.continue nexium 6.cotninue CPAP for OSA  Follow up in 1 month    The Patient requires high complexity decision making for assessment and support, frequent evaluation and titration of therapies, application of advanced monitoring  technologies and extensive interpretation of multiple databases.   Patient  satisfied with Plan of  action and management. All questions answered  Corrin Parker, M.D.  [facility]  Medical Director [place] Director [facility] Cardio-Pulmonary Department

## 2015-01-11 ENCOUNTER — Telehealth: Payer: Self-pay | Admitting: Internal Medicine

## 2015-01-11 NOTE — Telephone Encounter (Signed)
Patient wants to clarify new inhaler dosages and also should she continue nasal meds as well .  Please call to discuss.

## 2015-01-11 NOTE — Telephone Encounter (Signed)
All questions answered for pt. Nothing further needed.

## 2015-01-12 ENCOUNTER — Other Ambulatory Visit: Payer: Self-pay | Admitting: Internal Medicine

## 2015-01-12 ENCOUNTER — Ambulatory Visit
Admission: RE | Admit: 2015-01-12 | Discharge: 2015-01-12 | Disposition: A | Discharge status: Home / Self Care | Payer: Medicare Other | Source: Ambulatory Visit | Attending: Internal Medicine | Admitting: Internal Medicine

## 2015-01-12 DIAGNOSIS — Z1231 Encounter for screening mammogram for malignant neoplasm of breast: Secondary | ICD-10-CM

## 2015-01-18 ENCOUNTER — Encounter: Payer: Self-pay | Admitting: Internal Medicine

## 2015-02-11 ENCOUNTER — Emergency Department
Admission: EM | Admit: 2015-02-11 | Discharge: 2015-02-12 | Disposition: A | Discharge status: Home / Self Care | Payer: Medicare Other | Attending: Emergency Medicine | Admitting: Emergency Medicine

## 2015-02-11 ENCOUNTER — Ambulatory Visit (INDEPENDENT_AMBULATORY_CARE_PROVIDER_SITE_OTHER): Payer: Medicare Other | Admitting: *Deleted

## 2015-02-11 DIAGNOSIS — Z880 Allergy status to penicillin: Secondary | ICD-10-CM | POA: Diagnosis not present

## 2015-02-11 DIAGNOSIS — J449 Chronic obstructive pulmonary disease, unspecified: Secondary | ICD-10-CM

## 2015-02-11 DIAGNOSIS — Z7984 Long term (current) use of oral hypoglycemic drugs: Secondary | ICD-10-CM | POA: Insufficient documentation

## 2015-02-11 DIAGNOSIS — Z7982 Long term (current) use of aspirin: Secondary | ICD-10-CM | POA: Diagnosis not present

## 2015-02-11 DIAGNOSIS — R0602 Shortness of breath: Secondary | ICD-10-CM

## 2015-02-11 DIAGNOSIS — Z7951 Long term (current) use of inhaled steroids: Secondary | ICD-10-CM | POA: Diagnosis not present

## 2015-02-11 DIAGNOSIS — Z79899 Other long term (current) drug therapy: Secondary | ICD-10-CM | POA: Diagnosis not present

## 2015-02-11 DIAGNOSIS — E109 Type 1 diabetes mellitus without complications: Secondary | ICD-10-CM | POA: Insufficient documentation

## 2015-02-11 DIAGNOSIS — I10 Essential (primary) hypertension: Secondary | ICD-10-CM | POA: Diagnosis not present

## 2015-02-11 LAB — CBC WITH DIFFERENTIAL/PLATELET
Basophils Absolute: 0.1 10*3/uL (ref 0–0.1)
Basophils Relative: 1 %
EOS ABS: 0.2 10*3/uL (ref 0–0.7)
Eosinophils Relative: 3 %
HCT: 37.7 % (ref 35.0–47.0)
HEMOGLOBIN: 12.3 g/dL (ref 12.0–16.0)
LYMPHS ABS: 2.8 10*3/uL (ref 1.0–3.6)
LYMPHS PCT: 39 %
MCH: 25.8 pg — AB (ref 26.0–34.0)
MCHC: 32.7 g/dL (ref 32.0–36.0)
MCV: 79.1 fL — AB (ref 80.0–100.0)
MONOS PCT: 10 %
Monocytes Absolute: 0.7 10*3/uL (ref 0.2–0.9)
NEUTROS PCT: 47 %
Neutro Abs: 3.4 10*3/uL (ref 1.4–6.5)
Platelets: 314 10*3/uL (ref 150–440)
RBC: 4.76 MIL/uL (ref 3.80–5.20)
RDW: 22.3 % — ABNORMAL HIGH (ref 11.5–14.5)
WBC: 7.2 10*3/uL (ref 3.6–11.0)

## 2015-02-11 LAB — PULMONARY FUNCTION TEST
DL/VA % PRED: 83 %
DL/VA: 4.22 ml/min/mmHg/L
DLCO UNC: 25.22 ml/min/mmHg
DLCO unc % pred: 93 %
FEF 25-75 Post: 3.06 L/sec
FEF 25-75 Pre: 1.74 L/sec
FEF2575-%Change-Post: 76 %
FEF2575-%PRED-POST: 192 %
FEF2575-%Pred-Pre: 109 %
FEV1-%Change-Post: 11 %
FEV1-%PRED-PRE: 83 %
FEV1-%Pred-Post: 93 %
FEV1-Post: 2.04 L
FEV1-Pre: 1.83 L
FEV1FVC-%CHANGE-POST: 9 %
FEV1FVC-%Pred-Pre: 108 %
FEV6-%Change-Post: 2 %
FEV6-%Pred-Post: 84 %
FEV6-%Pred-Pre: 82 %
FEV6-Post: 2.34 L
FEV6-Pre: 2.29 L
FEV6FVC-%Pred-Post: 105 %
FEV6FVC-%Pred-Pre: 105 %
FVC-%Change-Post: 2 %
FVC-%PRED-POST: 80 %
FVC-%PRED-PRE: 78 %
FVC-POST: 2.34 L
FVC-PRE: 2.29 L
PRE FEV1/FVC RATIO: 80 %
Post FEV1/FVC ratio: 87 %
Post FEV6/FVC ratio: 100 %
Pre FEV6/FVC Ratio: 100 %
RV % pred: 82 %
RV: 2.02 L
TLC % pred: 82 %
TLC: 4.43 L

## 2015-02-11 LAB — COMPREHENSIVE METABOLIC PANEL
ALK PHOS: 84 U/L (ref 38–126)
ALT: 20 U/L (ref 14–54)
ANION GAP: 8 (ref 5–15)
AST: 22 U/L (ref 15–41)
Albumin: 4.2 g/dL (ref 3.5–5.0)
BILIRUBIN TOTAL: 0.4 mg/dL (ref 0.3–1.2)
BUN: 15 mg/dL (ref 6–20)
CO2: 30 mmol/L (ref 22–32)
CREATININE: 0.56 mg/dL (ref 0.44–1.00)
Calcium: 9.8 mg/dL (ref 8.9–10.3)
Chloride: 98 mmol/L — ABNORMAL LOW (ref 101–111)
Glucose, Bld: 109 mg/dL — ABNORMAL HIGH (ref 65–99)
Potassium: 4.2 mmol/L (ref 3.5–5.1)
SODIUM: 136 mmol/L (ref 135–145)
TOTAL PROTEIN: 7.5 g/dL (ref 6.5–8.1)

## 2015-02-11 LAB — TROPONIN I: TROPONIN I: 0.04 ng/mL — AB (ref <0.031)

## 2015-02-11 MED ORDER — ONDANSETRON HCL 4 MG/2ML IJ SOLN
4.0000 mg | Freq: Once | INTRAMUSCULAR | Status: AC
  Administered 2015-02-11: 4 mg via INTRAVENOUS

## 2015-02-11 NOTE — ED Notes (Addendum)
Pt to triage via wheelchair.  Pt reports high blood pressure today.  Pt is taking blood pressure meds and has taken dose today.  Pt denies headache or dizziness.  Pt does report nausea. Pt alert.  Speech clear.

## 2015-02-11 NOTE — Progress Notes (Signed)
SMW performed today. 

## 2015-02-11 NOTE — Progress Notes (Signed)
PFT performed today with Nitrogen Washout.

## 2015-02-11 NOTE — ED Notes (Signed)
Notified Dr. Karma Greaser regarding pt's elevated troponin.

## 2015-02-11 NOTE — ED Provider Notes (Signed)
[facility] Emergency Department Provider Note  ____________________________________________  Time seen: Approximately 11:38 PM  I have reviewed the triage vital signs and the nursing notes.   HISTORY  Chief Complaint Hypertension    HPI Amber Welch is a 81 y.o. female with multiple chronic medical problems include COPD,difficult to control hypertension, diabetes, hyperlipidemia, obesity, obstructive sleep apnea, and carotid artery occlusion bilaterally who presents with elevated blood pressure.  Her only accompanying symptom is nausea.  She specifically denies headache, shortness of breath, chest pain, abdominal pain, vomiting, diarrhea, dysuria.  She states that she has been getting short of breath recently with exertion but she saw Dr. Mortimer Fries with pulmonology for this just earlier today and it is not acutely different.  She reports that she has not been checking her blood pressure regularly for the last 2 months because it had been under adequate control although her doctors have told her in the past that her blood pressure is labile.  She showed me a log of prior recordings and she will have one measurement during a given day that is in the 0000000 systolic and the next will be in the upper 190s.  She presents tonight because she has had multiple consistent blood pressure readings over the last 24 hours that are all elevated as high as 231/113.  Her blood pressure is elevated in the emergency department as well.  Her hypertension is severe but her accompanying symptoms are mild with only slight nausea.  Her regular medications are not making it better and nothing seems to be making it worse.  Onset has been relatively gradual.   Past Medical History  Diagnosis Date  . HTN (hypertension)   . Diabetes type 1, controlled ([place])     whether it was controlled was not specified  . Hyperlipidemia   . Obesity   . Chest pain     previous normal cath 2003. normal Myoview x2   . OSA (obstructive sleep apnea)     CPAP  . Arthritis     Back and knees  . Carotid artery occlusion 2009    [place]  . COPD (chronic obstructive pulmonary disease) [facility])     Patient Active Problem List   Diagnosis Date Noted  . COPD (chronic obstructive pulmonary disease) ([place]) 01/08/2015  . Labile blood pressure 05/11/2011  . Hyperlipidemia 05/11/2011  . Preoperative cardiovascular examination 05/11/2011  . HYPERTENSION, BENIGN 01/26/2010  . CAROTID ARTERY STENOSIS, RIGHT 01/26/2010  . SHORTNESS OF BREATH 01/26/2010  . CHEST TIGHTNESS-PRESSURE-OTHER 01/26/2010    Past Surgical History  Procedure Laterality Date  . Right foot surgery Right April 2015    Right Foot and Ankle  . Right knee surgery      arthroscopy for torn meniscus  . Foot surgery      Left foot, bunionectomy and hammer toe  . Joint replacement  06/05/11    Right knee replaced  . Joint replacement  09/20/11    Left knee  . Dilation and curettage of uterus    . Colonoscopy with propofol N/A 07/31/2014    Procedure: COLONOSCOPY WITH PROPOFOL;  Surgeon: Hulen Luster, MD;  Location: [facility] ENDOSCOPY;  Service: Gastroenterology;  Laterality: N/A;    Current Outpatient Rx  Name  Route  Sig  Dispense  Refill  . albuterol (VENTOLIN HFA) 108 (90 Base) MCG/ACT inhaler   Inhalation   Inhale 2 puffs into the lungs every 4 (four) hours as needed for wheezing. UAD   1 Inhaler  12     Patient to call you if she needs refill   . ALPRAZolam (XANAX) 0.5 MG tablet   Oral   Take 0.25-0.5 mg by mouth at bedtime as needed for sleep.         Marland Kitchen aspirin 81 MG tablet   Oral   Take 81 mg by mouth daily.         . Azelastine HCl (ASTEPRO NA)   Nasal   Place 1 spray into the nose at bedtime as needed (congestion).          . Azilsartan Medoxomil (EDARBI) 80 MG TABS   Oral   Take 1 tablet by mouth daily.          . carvedilol (COREG) 12.5 MG tablet   Oral   Take 6.25 mg by mouth 2 (two) times daily.           Marland Kitchen esomeprazole (NEXIUM) 40 MG capsule   Oral   Take 40 mg by mouth daily before breakfast.           . ferrous sulfate 325 (65 FE) MG tablet   Oral   Take 325 mg by mouth 2 (two) times daily with a meal.         . Fluticasone Furoate-Vilanterol (BREO ELLIPTA) 100-25 MCG/INH AEPB   Inhalation   Inhale 1 puff into the lungs daily.   60 each   5   . gabapentin (NEURONTIN) 100 MG capsule   Oral   Take 300 mg by mouth at bedtime.          . hydrochlorothiazide (HYDRODIURIL) 25 MG tablet   Oral   Take 25 mg by mouth daily.         . magnesium gluconate (MAGONATE) 500 MG tablet   Oral   Take 500 mg by mouth daily.         . Melatonin 5 MG CAPS   Oral   Take 1 capsule by mouth at bedtime as needed (sleep).          . metFORMIN (GLUCOPHAGE) 500 MG tablet   Oral   Take 500 mg by mouth daily.           . mometasone (NASONEX) 50 MCG/ACT nasal spray   Nasal   Place 2 sprays into the nose daily as needed (allergies).          . nitroGLYCERIN (NITROSTAT) 0.4 MG SL tablet   Sublingual   Place 1 tablet (0.4 mg total) under the tongue every 5 (five) minutes as needed for chest pain.   25 tablet   6   . Umeclidinium Bromide (INCRUSE ELLIPTA) 62.5 MCG/INH AEPB   Inhalation   Inhale 1 puff into the lungs daily.   30 each   5     Allergies Penicillins; Ace inhibitors; Celebrex; Statins; Iodinated diagnostic agents; Norpramin; Phenobarbital; and Sinequan  Family History  Problem Relation Age of Onset  . Heart disease Father     Heart Disease before age 71  . Diabetes Father   . Hyperlipidemia Father   . Heart attack Father   . Heart disease Mother   . Hypertension Mother   . Heart attack Mother     Social History Social History  Substance Use Topics  . Smoking status: Never Smoker   . Smokeless tobacco: Never Used     Comment: tobacco use - no  . Alcohol Use: No    Review of Systems Constitutional: No fever/chills Eyes: No  visual  changes. ENT: No sore throat. Cardiovascular: Denies chest pain. Respiratory: Denies shortness of breath. Gastrointestinal: No abdominal pain.  Nausea, no vomiting.  No diarrhea.  No constipation. Genitourinary: Negative for dysuria. Musculoskeletal: Negative for back pain. Skin: Negative for rash. Neurological: Negative for headaches, focal weakness or numbness.  10-point ROS otherwise negative.  ____________________________________________   PHYSICAL EXAM:  VITAL SIGNS: ED Triage Vitals  Enc Vitals Group     BP 02/11/15 2231 239/96 mmHg     Pulse Rate 02/11/15 2231 70     Resp 02/11/15 2231 20     Temp 02/11/15 2231 98.8 F (37.1 C)     Temp Source 02/11/15 2231 Oral     SpO2 02/11/15 2231 98 %     Weight 02/11/15 2231 229 lb (103.874 kg)     Height 02/11/15 2231 5\' 6"  (1.676 m)     Head Cir --      Peak Flow --      Pain Score --      Pain Loc --      Pain Edu? --      Excl. in [place]? --     Constitutional: Alert and oriented. Well appearing and in no acute distress. Eyes: Conjunctivae are normal. PERRL. EOMI. Head: Atraumatic. Nose: No congestion/rhinnorhea. Mouth/Throat: Mucous membranes are moist.  Oropharynx non-erythematous. Neck: No stridor.   Cardiovascular: Normal rate, regular rhythm. Grossly normal heart sounds.  Good peripheral circulation. Respiratory: Normal respiratory effort.  No retractions. Lungs CTAB. Gastrointestinal: Soft and nontender. No distention. No abdominal bruits. No CVA tenderness. Musculoskeletal: No lower extremity tenderness nor edema.  No joint effusions. Neurologic:  Normal speech and language. No gross focal neurologic deficits are appreciated.  Skin:  Skin is warm, dry and intact. No rash noted. Psychiatric: Mood and affect are normal. Speech and behavior are normal.  ____________________________________________   LABS (all labs ordered are listed, but only abnormal results are displayed)  Labs Reviewed  CBC WITH  DIFFERENTIAL/PLATELET - Abnormal; Notable for the following:    MCV 79.1 (*)    MCH 25.8 (*)    RDW 22.3 (*)    All other components within normal limits  COMPREHENSIVE METABOLIC PANEL - Abnormal; Notable for the following:    Chloride 98 (*)    Glucose, Bld 109 (*)    All other components within normal limits  TROPONIN I - Abnormal; Notable for the following:    Troponin I 0.04 (*)    All other components within normal limits  TROPONIN I - Abnormal; Notable for the following:    Troponin I 0.04 (*)    All other components within normal limits   ____________________________________________  EKG  ED ECG REPORT I, Mellony Danziger, the attending physician, personally viewed and interpreted this ECG.  Date: 02/12/2015 EKG Time: 22:53 Rate: 77 Rhythm: normal sinus rhythm QRS Axis: normal Intervals: normal ST/T Wave abnormalities: normal Conduction Disturbances: none Narrative Interpretation: unremarkable  ____________________________________________  RADIOLOGY   No results found.  ____________________________________________   PROCEDURES  Procedure(s) performed: None  Critical Care performed: No ____________________________________________   INITIAL IMPRESSION / ASSESSMENT AND PLAN / ED COURSE  Pertinent labs & imaging results that were available during my care of the patient were reviewed by me and considered in my medical decision making (see chart for details).  Although the patient's blood pressure was significantly elevated, she was essentially asymptomatic except for some mild nausea which developed after she became anxious about her blood pressure.  That has  since resolved.  I gave her an extra dose of carvedilol 12.5 mg I mouth because she is supposed be taking a higher dose and her blood pressures improved to the 160s over 60s range.  She has no chest pain or pressure and her nausea is gone.  She has had 2 troponins which are consistent 0.04 in the setting of  at least a day or more of a very blood pressure; I do not believe this represents ACS or acute cardiac dysfunction.  We have observed her in the emergency department for nearly 5 hours and we all agree (myself, the patient, her daughter, and her husband) that she is appropriate for outpatient follow-up.  I stressed the importance of taking her medications as prescribed and following up with Dr. Sabra Heck at the next available opportunity.  I gave my usual and customary return precautions.     ____________________________________________  FINAL CLINICAL IMPRESSION(S) / ED DIAGNOSES  Final diagnoses:  Essential hypertension      NEW MEDICATIONS STARTED DURING THIS VISIT:  New Prescriptions   No medications on file     Hinda Kehr, MD 02/12/15 [phone]

## 2015-02-12 LAB — TROPONIN I: Troponin I: 0.04 ng/mL — ABNORMAL HIGH (ref <0.031)

## 2015-02-12 MED ORDER — CARVEDILOL 6.25 MG PO TABS
12.5000 mg | ORAL_TABLET | ORAL | Status: AC
  Administered 2015-02-12: 12.5 mg via ORAL
  Filled 2015-02-12: qty 2

## 2015-02-12 NOTE — Discharge Instructions (Signed)

## 2015-02-15 ENCOUNTER — Telehealth: Payer: Self-pay | Admitting: *Deleted

## 2015-02-15 NOTE — Telephone Encounter (Signed)
Informed pt that she didn't require O2 at night per her ONO. Nothing further needed.

## 2015-02-16 ENCOUNTER — Encounter: Payer: Self-pay | Admitting: Internal Medicine

## 2015-02-16 ENCOUNTER — Ambulatory Visit (INDEPENDENT_AMBULATORY_CARE_PROVIDER_SITE_OTHER): Payer: Medicare Other | Admitting: Internal Medicine

## 2015-02-16 DIAGNOSIS — J449 Chronic obstructive pulmonary disease, unspecified: Secondary | ICD-10-CM | POA: Diagnosis not present

## 2015-02-16 NOTE — Progress Notes (Signed)
[place] [facility] Consultation      Date: 02/16/2015,   MRN# [id number] Amber Welch Jan 29, 1934 Code Status:  Hosp day:@LENGTHOFSTAYDAYS @ Referring MD: @ATDPROV @     PCP:      Admission                  Current  Amber Welch is a 81 y.o. old female seen in consultation for SOB,wheezing, DOE  at the request of Dr Emily Filbert  Synopsis patient seen for evaluation for COPD   CHIEF COMPLAINT:   Follow up for PFT and 6MWT, patient has high blood pressure   HISTORY OF PRESENT ILLNESS   Patient has no SOB, Chest pain and no wheezing at this time Patient stopped Breo and Incruse due to elevated BP  I have reviewed PFT's with patient that were performed 1 year ago takes albuterol as needed sometimes Main compalin  is labile BP saw PMD and will see cardiology  Fev/FVC ratio: 66% Fev1 92% FEF25/75 39% FLow volume loops suggest airway obstruction DLCO 67%  Repeat PFT's 02/2015 Ratio 80% fev1 83% predcited Fef25/75 109% DLCO 93%   6MWT and ONO WNL  Repeat PFT's shows complete resolution of obtrutive airways disease, however this was on inhaled therapy Patient has NO lower ext swelling, patient does not have any signs of infection at this time She has dx of OSA and is compliant with CPAP   Wood stove smoke and second hand smoke expo   Current Medication:  Current outpatient prescriptions:  .  albuterol (VENTOLIN HFA) 108 (90 Base) MCG/ACT inhaler, Inhale 2 puffs into the lungs every 4 (four) hours as needed for wheezing. UAD, Disp: 1 Inhaler, Rfl: 12 .  ALPRAZolam (XANAX) 0.5 MG tablet, Take 0.25-0.5 mg by mouth at bedtime as needed for sleep., Disp: , Rfl:  .  aspirin 81 MG tablet, Take 81 mg by mouth daily., Disp: , Rfl:  .  Azelastine HCl (ASTEPRO NA), Place 1 spray into the nose at bedtime as needed (congestion). , Disp: , Rfl:  .  Azilsartan Medoxomil (EDARBI) 80 MG TABS, Take 1 tablet by mouth daily. , Disp: , Rfl:  .  carvedilol (COREG) 12.5 MG  tablet, Take 6.25 mg by mouth 2 (two) times daily. , Disp: , Rfl:  .  esomeprazole (NEXIUM) 40 MG capsule, Take 40 mg by mouth daily before breakfast.  , Disp: , Rfl:  .  ferrous sulfate 325 (65 FE) MG tablet, Take 325 mg by mouth 2 (two) times daily with a meal., Disp: , Rfl:  .  gabapentin (NEURONTIN) 100 MG capsule, Take 300 mg by mouth at bedtime. , Disp: , Rfl:  .  hydrochlorothiazide (HYDRODIURIL) 25 MG tablet, Take 25 mg by mouth daily., Disp: , Rfl:  .  magnesium gluconate (MAGONATE) 500 MG tablet, Take 500 mg by mouth daily., Disp: , Rfl:  .  Melatonin 5 MG CAPS, Take 1 capsule by mouth at bedtime as needed (sleep). , Disp: , Rfl:  .  metFORMIN (GLUCOPHAGE) 500 MG tablet, Take 500 mg by mouth daily.  , Disp: , Rfl:  .  mometasone (NASONEX) 50 MCG/ACT nasal spray, Place 2 sprays into the nose daily as needed (allergies). , Disp: , Rfl:  .  nitroGLYCERIN (NITROSTAT) 0.4 MG SL tablet, Place 1 tablet (0.4 mg total) under the tongue every 5 (five) minutes as needed for chest pain., Disp: 25 tablet, Rfl: 6 .  Fluticasone Furoate-Vilanterol (BREO ELLIPTA) 100-25 MCG/INH AEPB, Inhale 1 puff into  the lungs daily. (Patient not taking: Reported on 02/16/2015), Disp: 60 each, Rfl: 5 .  Umeclidinium Bromide (INCRUSE ELLIPTA) 62.5 MCG/INH AEPB, Inhale 1 puff into the lungs daily. (Patient not taking: Reported on 02/16/2015), Disp: 30 each, Rfl: 5    ALLERGIES   Penicillins; Ace inhibitors; Celebrex; Statins; Iodinated diagnostic agents; Norpramin; Phenobarbital; and Sinequan     REVIEW OF SYSTEMS   Review of Systems  Constitutional: Negative for fever, chills, weight loss and malaise/fatigue.  Respiratory: Negative for cough, hemoptysis, sputum production, shortness of breath and wheezing.   Cardiovascular: Negative for chest pain, palpitations and leg swelling.  Musculoskeletal: Negative.   Neurological: Negative for dizziness.  Endo/Heme/Allergies: Negative.   Psychiatric/Behavioral:  Negative.   All other systems reviewed and are negative.    VS: There were no vitals taken for this visit.     PHYSICAL EXAM  Physical Exam  Constitutional: She is oriented to person, place, and time. She appears well-developed and well-nourished. No distress.  HENT:  Mouth/Throat: No oropharyngeal exudate.  Eyes: EOM are normal. Pupils are equal, round, and reactive to light.  Neck: Normal range of motion. Neck supple.  Cardiovascular: Normal rate, regular rhythm and normal heart sounds.   No murmur heard. Pulmonary/Chest: No respiratory distress. She has no wheezes.  Musculoskeletal: She exhibits no edema.  Neurological: She is alert and oriented to person, place, and time.  Skin: Skin is warm. She is not diaphoretic.  Psychiatric: She has a normal mood and affect.       ASSESSMENT/PLAN   81 yo white female with chronic exposure to wood stove smoke with second hand smoke exposure with PFT suggestive of intermittent Mild Obstructive reactive  airways disease   1.will stop breo and incruse for now 2.recommend albuterol needed 3.continue nexium 4.continue CPAP for OSA 5.HTN workup with cardiology pending   Follow up in 3 months    The Patient requires high complexity decision making for assessment and support, frequent evaluation and titration of therapies, application of advanced monitoring technologies and extensive interpretation of multiple databases.   Patient  satisfied with Plan of action and management. All questions answered  Corrin Parker, M.D.  [facility]  Medical Director [place] Director [facility]io-Pulmonary Department

## 2015-02-16 NOTE — Patient Instructions (Signed)
Chronic Obstructive Pulmonary Disease Chronic obstructive pulmonary disease (COPD) is a common lung condition in which airflow from the lungs is limited. COPD is a general term that can be used to describe many different lung problems that limit airflow, including both chronic bronchitis and emphysema. If you have COPD, your lung function will probably never return to normal, but there are measures you can take to improve lung function and make yourself feel better. CAUSES   Smoking (common).  Exposure to secondhand smoke.  Genetic problems.  Chronic inflammatory lung diseases or recurrent infections. SYMPTOMS  Shortness of breath, especially with physical activity.  Deep, persistent (chronic) cough with a large amount of thick mucus.  Wheezing.  Rapid breaths (tachypnea).  Gray or bluish discoloration (cyanosis) of the skin, especially in your fingers, toes, or lips.  Fatigue.  Weight loss.  Frequent infections or episodes when breathing symptoms become much worse (exacerbations).  Chest tightness. DIAGNOSIS Your health care provider will take a medical history and perform a physical examination to diagnose COPD. Additional tests for COPD may include:  Lung (pulmonary) function tests.  Chest X-ray.  CT scan.  Blood tests. TREATMENT  Treatment for COPD may include:  Inhaler and nebulizer medicines. These help manage the symptoms of COPD and make your breathing more comfortable.  Supplemental oxygen. Supplemental oxygen is only helpful if you have a low oxygen level in your blood.  Exercise and physical activity. These are beneficial for nearly all people with COPD.  Lung surgery or transplant.  Nutrition therapy to gain weight, if you are underweight.  Pulmonary rehabilitation. This may involve working with a team of health care providers and specialists, such as respiratory, occupational, and physical therapists. HOME CARE INSTRUCTIONS  Take all medicines  (inhaled or pills) as directed by your health care provider.  Avoid over-the-counter medicines or cough syrups that dry up your airway (such as antihistamines) and slow down the elimination of secretions unless instructed otherwise by your health care provider.  If you are a smoker, the most important thing that you can do is stop smoking. Continuing to smoke will cause further lung damage and breathing trouble. Ask your health care provider for help with quitting smoking. He or she can direct you to community resources or hospitals that provide support.  Avoid exposure to irritants such as smoke, chemicals, and fumes that aggravate your breathing.  Use oxygen therapy and pulmonary rehabilitation if directed by your health care provider. If you require home oxygen therapy, ask your health care provider whether you should purchase a pulse oximeter to measure your oxygen level at home.  Avoid contact with individuals who have a contagious illness.  Avoid extreme temperature and humidity changes.  Eat healthy foods. Eating smaller, more frequent meals and resting before meals may help you maintain your strength.  Stay active, but balance activity with periods of rest. Exercise and physical activity will help you maintain your ability to do things you want to do.  Preventing infection and hospitalization is very important when you have COPD. Make sure to receive all the vaccines your health care provider recommends, especially the pneumococcal and influenza vaccines. Ask your health care provider whether you need a pneumonia vaccine.  Learn and use relaxation techniques to manage stress.  Learn and use controlled breathing techniques as directed by your health care provider. Controlled breathing techniques include:  Pursed lip breathing. Start by breathing in (inhaling) through your nose for 1 second. Then, purse your lips as if you were   going to whistle and breathe out (exhale) through the  pursed lips for 2 seconds.  Diaphragmatic breathing. Start by putting one hand on your abdomen just above your waist. Inhale slowly through your nose. The hand on your abdomen should move out. Then purse your lips and exhale slowly. You should be able to feel the hand on your abdomen moving in as you exhale.  Learn and use controlled coughing to clear mucus from your lungs. Controlled coughing is a series of short, progressive coughs. The steps of controlled coughing are: 1. Lean your head slightly forward. 2. Breathe in deeply using diaphragmatic breathing. 3. Try to hold your breath for 3 seconds. 4. Keep your mouth slightly open while coughing twice. 5. Spit any mucus out into a tissue. 6. Rest and repeat the steps once or twice as needed. SEEK MEDICAL CARE IF:  You are coughing up more mucus than usual.  There is a change in the color or thickness of your mucus.  Your breathing is more labored than usual.  Your breathing is faster than usual. SEEK IMMEDIATE MEDICAL CARE IF:  You have shortness of breath while you are resting.  You have shortness of breath that prevents you from:  Being able to talk.  Performing your usual physical activities.  You have chest pain lasting longer than 5 minutes.  Your skin color is more cyanotic than usual.  You measure low oxygen saturations for longer than 5 minutes with a pulse oximeter. MAKE SURE YOU:  Understand these instructions.  Will watch your condition.  Will get help right away if you are not doing well or get worse.   This information is not intended to replace advice given to you by your health care provider. Make sure you discuss any questions you have with your health care provider.   Document Released: 09/28/2004 Document Revised: 01/09/2014 Document Reviewed: 08/15/2012 Elsevier Interactive Patient Education 2016 [employer]  

## 2015-03-10 ENCOUNTER — Encounter: Payer: Self-pay | Admitting: Cardiovascular Disease

## 2015-03-10 ENCOUNTER — Ambulatory Visit (INDEPENDENT_AMBULATORY_CARE_PROVIDER_SITE_OTHER): Payer: Medicare Other | Admitting: Cardiovascular Disease

## 2015-03-10 VITALS — BP 130/86 | HR 62 | Ht 66.0 in | Wt 227.2 lb

## 2015-03-10 DIAGNOSIS — I6523 Occlusion and stenosis of bilateral carotid arteries: Secondary | ICD-10-CM

## 2015-03-10 DIAGNOSIS — R0789 Other chest pain: Secondary | ICD-10-CM

## 2015-03-10 DIAGNOSIS — E785 Hyperlipidemia, unspecified: Secondary | ICD-10-CM

## 2015-03-10 DIAGNOSIS — R0989 Other specified symptoms and signs involving the circulatory and respiratory systems: Secondary | ICD-10-CM

## 2015-03-10 DIAGNOSIS — R0602 Shortness of breath: Secondary | ICD-10-CM

## 2015-03-10 DIAGNOSIS — J449 Chronic obstructive pulmonary disease, unspecified: Secondary | ICD-10-CM

## 2015-03-10 DIAGNOSIS — I10 Essential (primary) hypertension: Secondary | ICD-10-CM | POA: Diagnosis not present

## 2015-03-10 DIAGNOSIS — R030 Elevated blood-pressure reading, without diagnosis of hypertension: Secondary | ICD-10-CM

## 2015-03-10 MED ORDER — NITROGLYCERIN 0.4 MG SL SUBL: 0.4000 mg | SUBLINGUAL_TABLET | SUBLINGUAL | Status: DC | PRN

## 2015-03-10 MED ORDER — HYDRALAZINE HCL 25 MG PO TABS: 25.0000 mg | ORAL_TABLET | Freq: Four times a day (QID) | ORAL | Status: DC | PRN

## 2015-03-10 NOTE — Assessment & Plan Note (Signed)
Unable to tolerate statins, recommended weight loss, low impact exercise

## 2015-03-10 NOTE — Assessment & Plan Note (Signed)
Blood pressure is well controlled on today's visit. No changes made to the medications. 

## 2015-03-10 NOTE — Assessment & Plan Note (Signed)
Long history of labile blood pressures, My suspicion is this is secondary to anxiety, worse at home. Recommended she take Xanax for situations when she feels anxious Ativan at nighttime to sleep Recommended she take her ARB in the late afternoon Blood pressure today on her clinic visit was well controlled, perhaps responding to higher dose carvedilol

## 2015-03-10 NOTE — Assessment & Plan Note (Signed)
50% carotid artery disease Unable to tolerate statins

## 2015-03-10 NOTE — Assessment & Plan Note (Signed)
Long history of chest tightness, pressure Atypical presentation, likely secondary to anxiety Very deconditioned, does not do any walking secondary to arthritis Suggested we work on her blood pressure first, which is labile, dating back many years Also recommended we need to work on her sleep as she has terrible insomnia If she continues to have symptoms, could order a stress test. She will call us if she has worsening chest tightness on exertion

## 2015-03-10 NOTE — Assessment & Plan Note (Signed)
Long history of shortness of breath, Likely probably secondary to deconditioning, obesity Component of reactive airway disease Needs regular exercise program but is limited by her arthritis and pain Recommended low impact exercise A benefit from pulmonary rehabilitation

## 2015-03-10 NOTE — Progress Notes (Signed)
Patient ID: Amber Welch, female    DOB: Oct 22, 1934, 81 y.o.   MRN: [id number]  HPI Comments: Amber Welch  is a 81 year old woman with  hypertension, hyperlipidemia, diabetes, morbid obesity, obstructive sleep apnea on BiPAP, depression, and mild asthma, previous symptoms of atypical chest pain, chronic shortness of breath felt secondary to asthma and obesity, who presents by referral from Dr. Mortimer Fries for evaluation of chest tightness, shortness of breath, labile severe hypertension. Remote history of labile blood pressures documented in cardiology note from 2013, sometimes with hypotension systolic in the [id number], other times with systolic pressure [id number] --History of 50%  carotid disease  She reports that she has significant stress at home. Husband is going deaf, place the TV very loud in the evenings. Feels very anxious, this is been a chronic issue. In the past was taking Xanax during the daytime for help with her symptoms. Currently has severe insomnia, recently started on Ativan before bed. Reports only sleeping 2-3 hours per night. She has been reluctant to take SSRI.  Significant medication allergies, calcium channel blockers make her skin sore. Recent increase in her carvedilol doses for severe hypertension. Blood pressure seems to be well controlled in the morning, very high in the afternoon evening.  Reports systolic pressures sometimes 200 or more. Gets very anxious checking her blood pressure, sometimes gets nausea. Feels the nausea could be secondary to drinking protein shakes, has had less nausea since she stopped the protein shakes in the past 3 days  No regular exercise program, limited by arthritis in her knees, hips, back  EKG on today's visit shows normal sinus rhythm with rate 62 bpm, no significant ST or T-wave changes  Other past medical history reviewed  seen [facility].  Myoview  showed normal ejection fraction and normal perfusion.  similar  episode in 2008 for which she also had a normal Myoview.  She also had a Holter monitor with Dr. Mable Fill which showed PACs.  F/u echo in 12/09 showed normal LV function with mild MR. she presents for routine followup. She has chronic shortness of breath likely secondary to asthma and obesity.   She had remote cardiac catheterization around 2003 which was normal.   Intolerant of statins    Allergies  Allergen Reactions  . Penicillins Swelling, Rash and Other (See Comments)    [name], facial/tongue/throat swelling, [name], then may proceed with Cephalosporin use.   . Ace Inhibitors Other (See Comments)    Wasn't sleeping well  . Celebrex [Celecoxib] Cough  . Statins Other (See Comments)    Joint pain  . Iodinated Diagnostic Agents Swelling and Rash    Swelling of the ankles  . Norpramin [Desipramine] Rash  . Phenobarbital Swelling and Rash    Swelling of the ankles  . Sinequan [Doxepin] Rash    Current Outpatient Prescriptions on File Prior to Visit  Medication Sig Dispense Refill  . albuterol (VENTOLIN HFA) 108 (90 Base) MCG/ACT inhaler Inhale 2 puffs into the lungs every 4 (four) hours as needed for wheezing. UAD 1 Inhaler 12  . ALPRAZolam (XANAX) 0.5 MG tablet Take 0.25-0.5  mg by mouth at bedtime as needed for sleep.    Marland Kitchen aspirin 81 MG tablet Take 81 mg by mouth daily.    . Azelastine HCl (ASTEPRO NA) Place 1 spray into the nose at bedtime as needed (congestion).     . Azilsartan Medoxomil (EDARBI) 80 MG TABS Take 1 tablet by mouth daily.     . carvedilol (COREG) 12.5 MG tablet Take 37.5 mg by mouth 2 (two) times  daily.     Marland Kitchen esomeprazole (NEXIUM) 40 MG capsule Take 40 mg by mouth daily before breakfast.      . ferrous sulfate 325 (65 FE) MG tablet Take 325 mg by mouth 2 (two) times daily with a meal.    . gabapentin (NEURONTIN) 100 MG capsule Take 300 mg by mouth at bedtime.     . hydrochlorothiazide (HYDRODIURIL) 25 MG tablet Take 25 mg by mouth daily.    . magnesium gluconate (MAGONATE) 500 MG tablet Take 500 mg by mouth daily.    . Melatonin 5 MG CAPS Take 1 capsule by mouth at bedtime as needed (sleep).     . metFORMIN (GLUCOPHAGE) 500 MG tablet Take 500 mg by mouth daily.      . mometasone (NASONEX) 50 MCG/ACT nasal spray Place 2 sprays into the nose daily as needed (allergies).      No current facility-administered medications on file prior to visit.    Past Medical History  Diagnosis Date  . HTN (hypertension)   . Diabetes type 1, controlled ([place])     whether it was controlled was not specified  . Hyperlipidemia   . Obesity   . Chest pain     previous normal cath 2003. normal Myoview x2  . OSA (obstructive sleep apnea)     CPAP  . Arthritis     Back and knees  . Carotid artery occlusion 2009    [place]  . COPD (chronic obstructive pulmonary disease) [facility])     Past Surgical History  Procedure Laterality Date  . Right foot surgery Right April 2015    Right Foot and Ankle  . Right knee surgery      arthroscopy for torn meniscus  . Foot surgery      Left foot, bunionectomy and hammer toe  . Joint replacement  06/05/11    Right knee replaced  . Joint replacement  09/20/11    Left knee  . Dilation and curettage of uterus    . Colonoscopy with propofol N/A 07/31/2014    Procedure: COLONOSCOPY WITH PROPOFOL;  Surgeon: Hulen Luster, MD;  Location: [facility] ENDOSCOPY;  Service: Gastroenterology;  Laterality: N/A;    Social History  reports that she has never smoked. She has never used smokeless tobacco. She reports that she does not drink alcohol or use illicit drugs.  Family  History family history includes Diabetes in her father; Heart attack in her father and mother; Heart disease in her father and mother; Hyperlipidemia in her father; Hypertension in her mother.  Review of Systems  Constitutional: Negative.   Respiratory: Positive for chest tightness and shortness of breath.   Cardiovascular: Negative.   Gastrointestinal: Positive for nausea.  Musculoskeletal: Negative.   Skin: Negative.   Neurological:       Episodes of near-syncope when blood pressure is low  Psychiatric/Behavioral: Positive for sleep disturbance.  All other systems reviewed and are negative.   BP 130/86 mmHg  Pulse 62  Ht 5\' 6"  (1.676 m)  Wt 227 lb 4 oz (  103.08 kg)  BMI 36.70 kg/m2  Physical Exam  Constitutional: She is oriented to person, place, and time. She appears well-developed and well-nourished.  Obese  HENT:  Head: Normocephalic.  Nose: Nose normal.  Mouth/Throat: Oropharynx is clear and moist.  Eyes: Conjunctivae are normal. Pupils are equal, round, and reactive to light.  Neck: Normal range of motion. Neck supple. No JVD present.  Cardiovascular: Normal rate, regular rhythm, S1 normal, S2 normal and intact distal pulses.  Exam reveals no gallop and no friction rub.   Murmur heard.  Crescendo systolic murmur is present with a grade of 2/6  Pulmonary/Chest: Effort normal and breath sounds normal. No respiratory distress. She has no wheezes. She has no rales. She exhibits no tenderness.  Abdominal: Soft. Bowel sounds are normal. She exhibits no distension. There is no tenderness.  Musculoskeletal: Normal range of motion. She exhibits no edema or tenderness.  Lymphadenopathy:    She has no cervical adenopathy.  Neurological: She is alert and oriented to person, place, and time. Coordination normal.  Skin: Skin is warm and dry. No rash noted. No erythema.  Psychiatric: She has a normal mood and affect. Her behavior is normal. Judgment and thought content normal.     Assessment and Plan  Nursing note and vitals reviewed.

## 2015-03-10 NOTE — Patient Instructions (Addendum)
Please move the edarbi in the late afternoon  For very high blood pressure, Take nitro under the tongue Please take hydralazine 1 to 2 pills every 2 hours until blood pressure  Improved OK to take with ativan  We will order a renal artery ultrasound for severe hypertension  Date & time: ______________________  Please call us if you have new issues that need to be addressed before your next appt.  Your physician wants you to follow-up in: 3 months.  You will receive a reminder letter in the mail two months in advance. If you don't receive a letter, please call our office to schedule the follow-up appointment.  Date & time: ____________________

## 2015-03-10 NOTE — Assessment & Plan Note (Addendum)
Followed by pulmonary, Dr. Mortimer Fries On inhalers   Total encounter time more than 45 minutes  Greater than 50% was spent in counseling and coordination of care with the patient

## 2015-03-11 ENCOUNTER — Ambulatory Visit: Payer: Medicare Other

## 2015-03-11 DIAGNOSIS — R0789 Other chest pain: Secondary | ICD-10-CM

## 2015-03-11 DIAGNOSIS — I10 Essential (primary) hypertension: Secondary | ICD-10-CM | POA: Diagnosis not present

## 2015-03-11 DIAGNOSIS — R0602 Shortness of breath: Secondary | ICD-10-CM

## 2015-03-16 ENCOUNTER — Ambulatory Visit: Payer: Medicare Other | Admitting: Cardiovascular Disease

## 2015-04-06 ENCOUNTER — Other Ambulatory Visit: Payer: Self-pay | Admitting: Internal Medicine

## 2015-04-06 DIAGNOSIS — N281 Cyst of kidney, acquired: Secondary | ICD-10-CM

## 2015-04-09 ENCOUNTER — Ambulatory Visit
Admission: RE | Admit: 2015-04-09 | Discharge: 2015-04-09 | Disposition: A | Discharge status: Home / Self Care | Payer: Medicare Other | Source: Ambulatory Visit | Attending: Internal Medicine | Admitting: Internal Medicine

## 2015-04-09 DIAGNOSIS — D1771 Benign lipomatous neoplasm of kidney: Secondary | ICD-10-CM | POA: Insufficient documentation

## 2015-04-09 DIAGNOSIS — N281 Cyst of kidney, acquired: Secondary | ICD-10-CM | POA: Diagnosis present

## 2015-06-04 ENCOUNTER — Ambulatory Visit: Payer: Medicare Other | Admitting: Cardiovascular Disease

## 2015-07-12 DIAGNOSIS — E133599 Other specified diabetes mellitus with proliferative diabetic retinopathy without macular edema, unspecified eye: Secondary | ICD-10-CM | POA: Insufficient documentation

## 2015-07-12 DIAGNOSIS — E1142 Type 2 diabetes mellitus with diabetic polyneuropathy: Secondary | ICD-10-CM | POA: Insufficient documentation

## 2015-07-16 ENCOUNTER — Telehealth: Payer: Self-pay | Admitting: Physician Assistant

## 2015-07-16 ENCOUNTER — Other Ambulatory Visit: Payer: Self-pay

## 2015-07-16 ENCOUNTER — Encounter: Payer: Self-pay | Admitting: Physician Assistant

## 2015-07-16 ENCOUNTER — Ambulatory Visit (INDEPENDENT_AMBULATORY_CARE_PROVIDER_SITE_OTHER): Payer: Medicare Other | Admitting: Physician Assistant

## 2015-07-16 VITALS — BP 198/100 | HR 64 | Ht 66.0 in | Wt 226.2 lb

## 2015-07-16 DIAGNOSIS — J45909 Unspecified asthma, uncomplicated: Secondary | ICD-10-CM | POA: Insufficient documentation

## 2015-07-16 DIAGNOSIS — I10 Essential (primary) hypertension: Secondary | ICD-10-CM | POA: Diagnosis not present

## 2015-07-16 DIAGNOSIS — F419 Anxiety disorder, unspecified: Secondary | ICD-10-CM | POA: Insufficient documentation

## 2015-07-16 DIAGNOSIS — R0789 Other chest pain: Secondary | ICD-10-CM | POA: Diagnosis not present

## 2015-07-16 DIAGNOSIS — Z789 Other specified health status: Secondary | ICD-10-CM | POA: Insufficient documentation

## 2015-07-16 DIAGNOSIS — Z0389 Encounter for observation for other suspected diseases and conditions ruled out: Secondary | ICD-10-CM

## 2015-07-16 DIAGNOSIS — Z87891 Personal history of nicotine dependence: Secondary | ICD-10-CM | POA: Insufficient documentation

## 2015-07-16 DIAGNOSIS — Z658 Other specified problems related to psychosocial circumstances: Secondary | ICD-10-CM

## 2015-07-16 DIAGNOSIS — F32A Depression, unspecified: Secondary | ICD-10-CM | POA: Insufficient documentation

## 2015-07-16 DIAGNOSIS — R030 Elevated blood-pressure reading, without diagnosis of hypertension: Secondary | ICD-10-CM | POA: Diagnosis not present

## 2015-07-16 DIAGNOSIS — F439 Reaction to severe stress, unspecified: Secondary | ICD-10-CM

## 2015-07-16 DIAGNOSIS — G4733 Obstructive sleep apnea (adult) (pediatric): Secondary | ICD-10-CM | POA: Diagnosis not present

## 2015-07-16 DIAGNOSIS — R5381 Other malaise: Secondary | ICD-10-CM | POA: Insufficient documentation

## 2015-07-16 DIAGNOSIS — R0989 Other specified symptoms and signs involving the circulatory and respiratory systems: Secondary | ICD-10-CM

## 2015-07-16 DIAGNOSIS — IMO0001 Reserved for inherently not codable concepts without codable children: Secondary | ICD-10-CM | POA: Insufficient documentation

## 2015-07-16 DIAGNOSIS — F329 Major depressive disorder, single episode, unspecified: Secondary | ICD-10-CM | POA: Insufficient documentation

## 2015-07-16 DIAGNOSIS — G4700 Insomnia, unspecified: Secondary | ICD-10-CM | POA: Insufficient documentation

## 2015-07-16 MED ORDER — TRIAMTERENE-HCTZ 75-50 MG PO TABS: 1.0000 | ORAL_TABLET | Freq: Every day | ORAL | Status: DC

## 2015-07-16 MED ORDER — HYDRALAZINE HCL 25 MG PO TABS: 25.0000 mg | ORAL_TABLET | Freq: Three times a day (TID) | ORAL | Status: DC

## 2015-07-16 NOTE — Progress Notes (Signed)
Cardiology Office Note Date:  07/16/2015  Patient ID:  Amber Welch, Amber Welch 1934/07/30, MRN [id number] PCP:  Rusty Aus, MD  Cardiologist:  Dr. Rockey Situ, MD    Chief Complaint: Follow up blood pressure  History of Present Illness: Amber Welch is a 81 y.o. female with history of labile HTN, HLD with statin intolerance, DM, morbid obesity, OSA on BiPAP, asthma with chronic SOB felt to be 2/2 asthma, deconditioning, atypical chest pain, carotid artery disease at 40-59% stenosis of RICA and A999333 LICA, multiple medication intolerances (CCB leads to sore skin), insomnia, anxiety, and depression who presents for evaluation of elevated blood pressures.   She has previously had a remote cardiac cath in 2003 that was reported as normal. Last ischemic evaluation in 2009 during admission to [facility] showed normal perfusion with normal EF. This admission was for similar complaint during a 2008 admission and she had a normal Myoview at that time as well. She apparently has worn a Holter monitor some time past that showed only PACs. Most recent echo from 12/2007 showed normal LVEF and mild MR. At her last office visit with Dr. Rockey Situ in March 2017, she had been under significant stress at home lately, reporting her husband is going deaf and turns the TV up loud in the evenings. She felt very anxious most of the time previously taking Xanax without much help. She has refused to take an SSRI. She also noted chronic atypical chest tightness that was felt to be 2/2 anxiety. She was advised to work her her blood pressure and if symptoms persisted to pursue outpatient stress testing. She underwent renal artery ultrasound given her HTN in March 2017, that showed no evidence of stenosis, though there was an echogenic finding in the right kidney. She was advised to follow up with Dr. Sabra Heck, MD (her PCP) for this. I do not see where this has been followed up on. She continued to have elevated blood pressure readings in the  99991111 to Q000111Q systolic when she last saw her PCP on 07/12/15. She continued to be under more stress at that time given the loud TV as it "gets on her nerves."  At that time Cardura 4 mg was added to her regimen. Her HCTZ  25 mg was also increased from 1 tab three times weekly to 2 tabs three times weekly, and she was continued on Coreg 37.5 mg bid along with Edarbi 80 mg daily.   She called the office noting persistently elevated blood pressures in the XX123456 systolic range. Her blood pressure is almost always elevated when she checks it, not matter what time of day or what is going on at home. She has not been taking hydralazine as she reports she was advised not to be her PCP. She was previously on Maxzide with good results though this was discontinued by PCP for uncertain reasons. She has not taken Cardura yet as she does not want to take this medication. She has had intermittent headaches. Though never with chest pain or focal deficit. She is scheduled to go on a trip to [place] the following week.    Past Medical History  Diagnosis Date  . Labile hypertension     a. when she is stressed BP will increase to the 123456 systolic  . Diabetes type 2, controlled ([place])   . Hyperlipidemia   . Morbid obesity ([place])   . Normal coronary arteries     a. normal cath 2003; b. normal Myoview in 2008 &  2009  . OSA (obstructive sleep apnea)     a. BiPAP  . Arthritis     Back and knees  . Carotid artery disease ([place])     a. carotid doppler 2016: [id number] RICA, A999333 LICA  . COPD (chronic obstructive pulmonary disease) ([place])   . Asthma     a. chronic SOB  . Physical deconditioning   . Medication intolerance   . Anxiety   . Depression   . Insomnia     Past Surgical History  Procedure Laterality Date  . Right foot surgery Right April 2015    Right Foot and Ankle  . Right knee surgery      arthroscopy for torn meniscus  . Foot surgery      Left foot, bunionectomy and hammer toe  . Joint replacement   06/05/11    Right knee replaced  . Joint replacement  09/20/11    Left knee  . Dilation and curettage of uterus    . Colonoscopy with propofol N/A 07/31/2014    Procedure: COLONOSCOPY WITH PROPOFOL;  Surgeon: Hulen Luster, MD;  Location: [facility] ENDOSCOPY;  Service: Gastroenterology;  Laterality: N/A;    Current Outpatient Prescriptions  Medication Sig Dispense Refill  . albuterol (VENTOLIN HFA) 108 (90 Base) MCG/ACT inhaler Inhale 2 puffs into the lungs every 4 (four) hours as needed for wheezing. UAD 1 Inhaler 12  . ALPRAZolam (XANAX) 0.5 MG tablet Take 0.25-0.5 mg by mouth at bedtime as needed for sleep.    Marland Kitchen aspirin 81 MG tablet Take 81 mg by mouth daily.    . Azelastine HCl (ASTEPRO NA) Place 1 spray into the nose at bedtime as needed (congestion).     . Azilsartan Medoxomil (EDARBI) 80 MG TABS Take 1 tablet by mouth daily.     . carvedilol (COREG) 12.5 MG tablet Take 37.5 mg by mouth 2 (two) times daily.     Marland Kitchen esomeprazole (NEXIUM) 40 MG capsule Take 40 mg by mouth daily before breakfast.      . ferrous sulfate 325 (65 FE) MG tablet Take 325 mg by mouth 2 (two) times daily with a meal.    . gabapentin (NEURONTIN) 100 MG capsule Take 300 mg by mouth at bedtime.     Marland Kitchen LORazepam (ATIVAN) 1 MG tablet Take 1 mg by mouth at bedtime.    . magnesium gluconate (MAGONATE) 500 MG tablet Take 500 mg by mouth daily.    . Melatonin 5 MG CAPS Take 1 capsule by mouth at bedtime as needed (sleep).     . metFORMIN (GLUCOPHAGE) 500 MG tablet Take 500 mg by mouth daily.      . mometasone (NASONEX) 50 MCG/ACT nasal spray Place 2 sprays into the nose daily as needed (allergies).     . nitroGLYCERIN (NITROSTAT) 0.4 MG SL tablet Place 1 tablet (0.4 mg total) under the tongue every 5 (five) minutes as needed for chest pain. 25 tablet 6  . hydrALAZINE (APRESOLINE) 25 MG tablet Take 1 tablet (25 mg total) by mouth 3 (three) times daily. 90 tablet 11  . triamterene-hydrochlorothiazide (MAXZIDE) 75-50 MG tablet Take 1  tablet by mouth daily. 30 tablet 11   No current facility-administered medications for this visit.    Allergies:   Penicillins; Ace inhibitors; Celebrex; Statins; Iodinated diagnostic agents; Norpramin; Phenobarbital; and Sinequan   Social History:  The patient  reports that she has never smoked. She has never used smokeless tobacco. She reports that she does not drink alcohol or  use illicit drugs.   Family History:  The patient's family history includes Diabetes in her father; Heart attack in her father and mother; Heart disease in her father and mother; Hyperlipidemia in her father; Hypertension in her mother.  ROS:   Review of Systems  Constitutional: Positive for malaise/fatigue. Negative for fever, chills, weight loss and diaphoresis.  HENT: Negative for congestion.   Eyes: Negative for discharge and redness.  Respiratory: Negative for cough, hemoptysis, sputum production, shortness of breath and wheezing.   Cardiovascular: Negative for chest pain, palpitations, orthopnea, claudication, leg swelling and PND.  Gastrointestinal: Negative for heartburn, nausea, vomiting and abdominal pain.  Musculoskeletal: Negative for myalgias and falls.  Skin: Negative for rash.  Neurological: Positive for headaches. Negative for dizziness, tingling, tremors, sensory change, speech change, focal weakness, loss of consciousness and weakness.  Endo/Heme/Allergies: Does not bruise/bleed easily.  Psychiatric/Behavioral: Negative for substance abuse. The patient is nervous/anxious.   All other systems reviewed and are negative.    PHYSICAL EXAM:  VS:  BP 198/100 mmHg  Pulse 64  Ht 5\' 6"  (1.676 m)  Wt 226 lb 4 oz (102.626 kg)  BMI 36.54 kg/m2 BMI: Body mass index is 36.54 kg/(m^2).  Physical Exam  Constitutional: She is oriented to person, place, and time. She appears well-developed and well-nourished.  HENT:  Head: Normocephalic and atraumatic.  Eyes: Right eye exhibits no discharge. Left eye  exhibits no discharge.  Neck: Normal range of motion. No JVD present.  Cardiovascular: Normal rate, regular rhythm, S1 normal, S2 normal and normal heart sounds.  Exam reveals no distant heart sounds, no friction rub, no midsystolic click and no opening snap.   No murmur heard. Pulmonary/Chest: Effort normal and breath sounds normal. No respiratory distress. She has no decreased breath sounds. She has no wheezes. She has no rales. She exhibits no tenderness.  Abdominal: Soft. She exhibits no distension. There is no tenderness.  Musculoskeletal: She exhibits no edema.  Neurological: She is alert and oriented to person, place, and time.  Skin: Skin is warm and dry. No cyanosis. Nails show no clubbing.  Psychiatric: She has a normal mood and affect. Her speech is normal and behavior is normal. Judgment and thought content normal.     EKG:  Was not ordered today.  Recent Labs: 02/11/2015: ALT 20; BUN 15; Creatinine, Ser 0.56; Hemoglobin 12.3; Platelets 314; Potassium 4.2; Sodium 136  No results found for requested labs within last 365 days.   CrCl cannot be calculated (Patient has no serum creatinine result on file.).   Wt Readings from Last 3 Encounters:  07/16/15 226 lb 4 oz (102.626 kg)  03/10/15 227 lb 4 oz (103.08 kg)  02/11/15 229 lb (103.874 kg)     Other studies reviewed: Additional studies/records reviewed today include: summarized above  ASSESSMENT AND PLAN:  1. Malignant hypertension: Readings are almost exclusively in the XX123456 systolic range. There does not appear to be an association with her husband's TV as her BP is significantly elevated in the office today at 99991111 systolic. No matter when she checks her BP or what is going on at home her BP is always elevated. Recommend restarting Maxzide 75/50 mg and stopping HCTZ. She will start taking hydralazine 25 mg tid standing with possible titration at follow up. Consider adding Imdur or clonidine if needed. Continue Coreg and  Edarbi. She does not want to travel to [place] at this time given her BP. I think that is ok for her to stay local until  her BP improves and perhaps she can go in the fall or winter.   2. Stress/anxiety: This is a component for her, though I suspect less likely the main culprit given the above. Per PCP.   3. Renal abnormality on ultrasound: Needs follow up with PCP for further imaging.   4. History of atypical chest pain: Resolved. No further work up at this time. If she redevelops pain could pursue stress testing.   5. OSA: She requests 2nd opinion with pulmonology. Referral made at her request.   Disposition: F/u with myself or Dr. Rockey Situ in 1 week  Current medicines are reviewed at length with the patient today.  The patient did not have any concerns regarding medicines.  Melvern Banker PA-C 07/16/2015 2:51 PM     [address] [place],  [address] [phone]

## 2015-07-16 NOTE — Telephone Encounter (Signed)
Per Amber Welch, pt needs referral to pulmonary for OSA. Order placed. S/w pt who is agreeable w/  Aug 1 @ 10:40 w/Dr. Rockey Situ Aug 1 @ 11:45 with Dr. Stevenson Clinch

## 2015-07-16 NOTE — Patient Instructions (Addendum)
Medication Instructions:  Your physician has recommended you make the following change in your medication:  START taking maxzide 75/50mg  once daily STOP taking hydrochlorothiazide  DECREASE hydralazine to 25mg  three times daily   Labwork: none  Testing/Procedures: none  Follow-Up: Your physician recommends that you schedule a follow-up appointment in: 1-2 weeks with Dr. Rockey Situ or Christell Faith, PA-C   Any Other Special Instructions Will Be Listed Below (If Applicable).     If you need a refill on your cardiac medications before your next appointment, please call your pharmacy.

## 2015-07-30 ENCOUNTER — Ambulatory Visit: Payer: Medicare Other | Admitting: Physician Assistant

## 2015-08-03 ENCOUNTER — Encounter: Payer: Self-pay | Admitting: Cardiovascular Disease

## 2015-08-03 ENCOUNTER — Encounter: Payer: Self-pay | Admitting: Internal Medicine

## 2015-08-03 ENCOUNTER — Ambulatory Visit (INDEPENDENT_AMBULATORY_CARE_PROVIDER_SITE_OTHER): Payer: Medicare Other | Admitting: Internal Medicine

## 2015-08-03 ENCOUNTER — Ambulatory Visit (INDEPENDENT_AMBULATORY_CARE_PROVIDER_SITE_OTHER): Payer: Medicare Other | Admitting: Cardiovascular Disease

## 2015-08-03 VITALS — BP 110/58 | HR 57 | Ht 66.0 in | Wt 225.5 lb

## 2015-08-03 VITALS — BP 142/80 | HR 65 | Ht 66.0 in | Wt 226.2 lb

## 2015-08-03 DIAGNOSIS — E785 Hyperlipidemia, unspecified: Secondary | ICD-10-CM

## 2015-08-03 DIAGNOSIS — R030 Elevated blood-pressure reading, without diagnosis of hypertension: Secondary | ICD-10-CM

## 2015-08-03 DIAGNOSIS — I159 Secondary hypertension, unspecified: Secondary | ICD-10-CM

## 2015-08-03 DIAGNOSIS — G4733 Obstructive sleep apnea (adult) (pediatric): Secondary | ICD-10-CM | POA: Diagnosis not present

## 2015-08-03 DIAGNOSIS — R0989 Other specified symptoms and signs involving the circulatory and respiratory systems: Secondary | ICD-10-CM

## 2015-08-03 DIAGNOSIS — J4520 Mild intermittent asthma, uncomplicated: Secondary | ICD-10-CM | POA: Diagnosis not present

## 2015-08-03 DIAGNOSIS — I6523 Occlusion and stenosis of bilateral carotid arteries: Secondary | ICD-10-CM | POA: Diagnosis not present

## 2015-08-03 DIAGNOSIS — R0602 Shortness of breath: Secondary | ICD-10-CM

## 2015-08-03 DIAGNOSIS — J449 Chronic obstructive pulmonary disease, unspecified: Secondary | ICD-10-CM

## 2015-08-03 MED ORDER — HYDRALAZINE HCL 25 MG PO TABS: 25.0000 mg | ORAL_TABLET | Freq: Four times a day (QID) | ORAL | 11 refills | Status: DC

## 2015-08-03 NOTE — Patient Instructions (Addendum)
Medication Instructions:   Ok to take extra hydralazine if needed for high pressure   Ok to decrease coreg down to 25 mg twice day  Labwork:  No new labs  Testing/Procedures:  No new testing  Follow-Up: It was a pleasure seeing you in the office today. Please call us if you have new issues that need to be addressed before your next appt.  [phone]  Your physician wants you to follow-up in: 3 months.  You will receive a reminder letter in the mail two months in advance. If you don't receive a letter, please call our office to schedule the follow-up appointment.  If you need a refill on your cardiac medications before your next appointment, please call your pharmacy.

## 2015-08-03 NOTE — Progress Notes (Signed)
[facility] Consultation      MRN# [id number] Amber Welch 1934-09-30   CC: Chief Complaint  Patient presents with  . Follow-up    per Ryann Dunn. last study study in 2015. currently wears vpap 6-7hr nightly. feels pressure & mask are okay. DME: [facility]. EPWORTH: 6      Brief History: 81 yo F Sleep apnea and asthma, currently not taking any inhalers, on VPAP for OSA   Events since last clinic visit: She presents today for a follow-up visit of her sleep apnea and her asthma. She was last seen in January 2017, at that time it was determined that she had asthma, did not tolerate Breo, had a prescription, standing prescription for Incurse.  However, instead visit she has not filled her prescription, because she states she does not think that she needs any inhalers at this time. She was following cardiology for elevated blood pressure, and complained about sleep initiation. At her last cardiology visit her pressure was noted to be elevated, her blood pressure medications including Lasix and her beta blocker was adjusted and since then her pressures better control and sleep initiation has been better also. Today she states that she does take Xanax, and Ativan at times, however Ativan makes her groggy the next day. She also uses melatonin intermittently for sleep initiation. She is on a VPAP for OSA, ESS today is 6   Currently taking xanax 0.5mg  at night, and melatonin 5mg  as needed at night for sleep initiation    Review of Systems  Constitutional: Negative for diaphoresis, malaise/fatigue and weight loss.  HENT: Negative for hearing loss.   Respiratory: Negative for cough, hemoptysis, sputum production, shortness of breath and wheezing.   Cardiovascular: Negative for chest pain.  Gastrointestinal: Negative for heartburn.  Genitourinary: Negative for dysuria.  Musculoskeletal: Negative for myalgias.  Skin: Negative for itching and rash.  Neurological: Negative  for dizziness and headaches.  Endo/Heme/Allergies: Does not bruise/bleed easily.  Psychiatric/Behavioral: Negative for depression.      Allergies:  Penicillins; Ace inhibitors; Celebrex [celecoxib]; Statins; Iodinated diagnostic agents; Norpramin [desipramine]; Phenobarbital; and Sinequan [doxepin]  Physical Examination:  VS: BP (!) 142/80 (BP Location: Left Arm, Cuff Size: Normal)   Pulse 65   Ht 5\' 6"  (1.676 m)   Wt 226 lb 3.2 oz (102.6 kg)   SpO2 97%   BMI 36.51 kg/m   General Appearance: No distress  HEENT: PERRLA, no ptosis, no other lesions noticed Pulmonary:normal breath sounds., diaphragmatic excursion normal.No wheezing, No rales   Cardiovascular:  Normal S1,S2.  No m/r/g.     Abdomen:Exam: Benign, Soft, non-tender, No masses  Skin:   warm, no rashes, no ecchymosis  Extremities: normal, no cyanosis, clubbing, warm with normal capillary refill.      Assessment and Plan:81 yo with asthma and OSA presents for follow up visit Asthma Patient with known history of asthma. Followed closely by Dr. Carmelina Peal. Currently not using any inhalers, patient states that he does not give her great benefit at this time and does not think that she needs it. She has had full PFTs done in the past. At this time because she is asymptomatic and has very minimal symptoms, she just has a rescue inhaler. She has a standing prescription for an anticholinergic inhaler which she will fill if she thinks she needs it.  Plan: -In office spirometry prior to follow-up visit -Rescue inhaler as needed  OSA (obstructive sleep apnea) She states she had a sleep study done in  2015 with [facility] med. Currently there are no scanned documents in our system, we will obtained a copy of the sleep study from her records. She states that she is on a special mode of Pap therapy called VPAP - centrally this is like a ventilator or a BiPAP. She does have problems initiating sleep, and has been using Xanax along with  adjustment of her blood pressure medications, this is now resolving. I have educated her on the use of Xanax, Ativan, and melatonin, and not to use combination drugs for sleep initiation as they can cause prolonged sleep latency.  Plan: -Obtain copy of sleep study -Implants report a follow-up visit -Continue with current sleep mode (VPAP) and settings until follow-up visit with Dr. Mortimer Fries   Updated Medication List Outpatient Encounter Prescriptions as of 08/03/2015  Medication Sig  . albuterol (VENTOLIN HFA) 108 (90 Base) MCG/ACT inhaler Inhale 2 puffs into the lungs every 4 (four) hours as needed for wheezing. UAD  . ALPRAZolam (XANAX) 0.5 MG tablet Take 0.25-0.5 mg by mouth at bedtime as needed for sleep.  Marland Kitchen aspirin 81 MG tablet Take 81 mg by mouth daily.  . Azelastine HCl (ASTEPRO NA) Place 1 spray into the nose at bedtime as needed (congestion).   . Azilsartan Medoxomil (EDARBI) 80 MG TABS Take 1 tablet by mouth daily.   . carvedilol (COREG) 12.5 MG tablet Take 37.5 mg by mouth 2 (two) times daily.   Marland Kitchen esomeprazole (NEXIUM) 40 MG capsule Take 40 mg by mouth daily before breakfast.    . ferrous sulfate 325 (65 FE) MG tablet Take 325 mg by mouth 2 (two) times daily with a meal.  . gabapentin (NEURONTIN) 100 MG capsule Take 300 mg by mouth at bedtime.   . hydrALAZINE (APRESOLINE) 25 MG tablet Take 1 tablet (25 mg total) by mouth 4 (four) times daily.  Marland Kitchen LORazepam (ATIVAN) 1 MG tablet Take 1 mg by mouth at bedtime.  . magnesium gluconate (MAGONATE) 500 MG tablet Take 500 mg by mouth daily.  . Melatonin 5 MG CAPS Take 1 capsule by mouth at bedtime as needed (sleep).   . metFORMIN (GLUCOPHAGE) 500 MG tablet Take 500 mg by mouth daily.    . mometasone (NASONEX) 50 MCG/ACT nasal spray Place 2 sprays into the nose daily as needed (allergies).   . nitroGLYCERIN (NITROSTAT) 0.4 MG SL tablet Place 1 tablet (0.4 mg total) under the tongue every 5 (five) minutes as needed for chest pain.  Marland Kitchen  triamterene-hydrochlorothiazide (MAXZIDE) 75-50 MG tablet Take 1 tablet by mouth daily.  . [DISCONTINUED] hydrALAZINE (APRESOLINE) 25 MG tablet Take 1 tablet (25 mg total) by mouth 3 (three) times daily.   No facility-administered encounter medications on file as of 08/03/2015.     Orders for this visit: No orders of the defined types were placed in this encounter.   Thank  you for the visitation and for allowing  [facility] to assist in the care of your patient. Our recommendations are noted above.  Please contact us if we can be of further service.  Vilinda Boehringer, MD [facility] Office Number: [phone]  Note: This note was prepared with Dragon dictation along with smaller phrase technology. Any transcriptional errors that result from this process are unintentional.

## 2015-08-03 NOTE — Patient Instructions (Addendum)
Follow up with Dr. Mortimer Fries in: 3 months - cont Xanax 0.5mg  QHS as needed for sleep initiation  - cont with VPAP nightly - in office spirometry prior to follow up visit.  - we will get a copy of your sleep study for our records

## 2015-08-03 NOTE — Assessment & Plan Note (Signed)
She states she had a sleep study done in 2015 with [facility]. Currently there are no scanned documents in our system, we will obtained a copy of the sleep study from her records. She states that she is on a special mode of Pap therapy called VPAP - centrally this is like a ventilator or a BiPAP. She does have problems initiating sleep, and has been using Xanax along with adjustment of her blood pressure medications, this is now resolving. I have educated her on the use of Xanax, Ativan, and melatonin, and not to use combination drugs for sleep initiation as they can cause prolonged sleep latency.  Plan: -Obtain copy of sleep study -Implants report a follow-up visit -Continue with current sleep mode (VPAP) and settings until follow-up visit with Dr. Mortimer Fries

## 2015-08-03 NOTE — Progress Notes (Signed)
Cardiology Office Note  Date:  08/03/2015   ID:  Amber Welch, DOB 09-07-1934, MRN [id number]  PCP:  Rusty Aus, MD   Chief Complaint  Patient presents with  . Other    1-2 week follow up. Meds reviewed by the patient verbally. "doing well."     HPI:  Amber Welch  is a 81 year old woman with  hypertension, hyperlipidemia, diabetes, morbid obesity, obstructive sleep apnea on BiPAP, depression, and mild asthma, previous symptoms of atypical chest pain, chronic shortness of breath felt secondary to asthma and obesity, previously seen for evaluation of chest tightness, shortness of breath, labile severe hypertension. Remote history of labile blood pressures documented in cardiology note from 2013, sometimes with hypotension systolic in the [id number], other times with systolic pressure 123456 --History of <40%  carotid disease She presents today to discuss her blood pressure  In follow-up, she reports that she is doing somewhat better Continues to have labile blood pressure, with systolic pressure 123456 to [id number] In general though her Average has been 130s to 140s She is taking hydralazine 25 mg 3 times per day She would like to decrease her carvedilol, currently taking 37.5 mg twice a day Feels is making her tired Previous lipid panel May 2017 total cholesterol 206, LDL 126  Deconditioned, weight continues to run high, no regular exercise program  Having eye problems  Total chol 200, statin intolerance Sleep study in 2015, she is unaware of the results. Reports she was never build, done in the hospital  EKG on today's visit shows normal sinus rhythm with rate 62 bpm, no significant ST or T-wave changes  Other past medical history Previously reported significant stress at home. Husband is going deaf, place the TV very loud in the evenings. Feels very anxious, this is been a chronic issue. In the past was taking Xanax during the daytime for help with her symptoms. Currently has severe insomnia,  recently started on Ativan before bed. Reports only sleeping 2-3 hours per night. She has been reluctant to take SSRI.  Significant medication allergies, calcium channel blockers make her skin sore. Previously reported Gets very anxious checking her blood pressure, sometimes gets nausea.  arthritis in her knees, hips, back   seen at [facility] in 2009.  Myoview  showed normal ejection fraction and normal perfusion.  similar episode in 2008 for which she also had a normal Myoview.  She also had a Holter monitor with Dr. Mable Fill which showed PACs.  F/u echo in 12/09 showed normal LV function with mild MR. she presents for routine followup. She has chronic shortness of breath likely secondary to asthma and obesity.   She had remote cardiac catheterization around 2003 which was normal.   PMH:   has a past medical history of Anxiety; Arthritis; Asthma; Carotid artery disease ([place]); COPD (chronic obstructive pulmonary disease) ([place]); Depression; Diabetes type 2, controlled ([place]); Hyperlipidemia; Insomnia; Labile hypertension; Medication intolerance; Morbid obesity ([place]); Normal coronary arteries; OSA (obstructive sleep apnea); and Physical deconditioning.  PSH:    Past Surgical History:  Procedure Laterality Date  . COLONOSCOPY WITH PROPOFOL N/A 07/31/2014   Procedure: COLONOSCOPY WITH PROPOFOL;  Surgeon: Hulen Luster, MD;  Location: [facility] ENDOSCOPY;  Service: Gastroenterology;  Laterality: N/A;  . DILATION AND CURETTAGE OF UTERUS    . FOOT SURGERY     Left foot, bunionectomy and hammer toe  . JOINT REPLACEMENT  06/05/11   Right knee replaced  . JOINT REPLACEMENT  09/20/11   Left  knee  . right foot surgery Right April 2015   Right Foot and Ankle  . right knee surgery     arthroscopy for torn meniscus    Current Outpatient Prescriptions  Medication Sig Dispense Refill  . albuterol (VENTOLIN HFA) 108 (90 Base) MCG/ACT inhaler Inhale 2 puffs into the lungs every 4 (four) hours  as needed for wheezing. UAD 1 Inhaler 12  . ALPRAZolam (XANAX) 0.5 MG tablet Take 0.25-0.5 mg by mouth at bedtime as needed for sleep.    Marland Kitchen aspirin 81 MG tablet Take 81 mg by mouth daily.    . Azelastine HCl (ASTEPRO NA) Place 1 spray into the nose at bedtime as needed (congestion).     . Azilsartan Medoxomil (EDARBI) 80 MG TABS Take 1 tablet by mouth daily.     . carvedilol (COREG) 12.5 MG tablet Take 37.5 mg by mouth 2 (two) times daily.     Marland Kitchen esomeprazole (NEXIUM) 40 MG capsule Take 40 mg by mouth daily before breakfast.      . ferrous sulfate 325 (65 FE) MG tablet Take 325 mg by mouth 2 (two) times daily with a meal.    . gabapentin (NEURONTIN) 100 MG capsule Take 300 mg by mouth at bedtime.     . hydrALAZINE (APRESOLINE) 25 MG tablet Take 1 tablet (25 mg total) by mouth 3 (three) times daily. 90 tablet 11  . LORazepam (ATIVAN) 1 MG tablet Take 1 mg by mouth at bedtime.    . magnesium gluconate (MAGONATE) 500 MG tablet Take 500 mg by mouth daily.    . Melatonin 5 MG CAPS Take 1 capsule by mouth at bedtime as needed (sleep).     . metFORMIN (GLUCOPHAGE) 500 MG tablet Take 500 mg by mouth daily.      . mometasone (NASONEX) 50 MCG/ACT nasal spray Place 2 sprays into the nose daily as needed (allergies).     . nitroGLYCERIN (NITROSTAT) 0.4 MG SL tablet Place 1 tablet (0.4 mg total) under the tongue every 5 (five) minutes as needed for chest pain. 25 tablet 6  . triamterene-hydrochlorothiazide (MAXZIDE) 75-50 MG tablet Take 1 tablet by mouth daily. 30 tablet 11   No current facility-administered medications for this visit.      Allergies:   Penicillins; Ace inhibitors; Celebrex [celecoxib]; Statins; Iodinated diagnostic agents; Norpramin [desipramine]; Phenobarbital; and Sinequan [doxepin]   Social History:  The patient  reports that she has never smoked. She has never used smokeless tobacco. She reports that she does not drink alcohol or use drugs.   Family History:   family history  includes Diabetes in her father; Heart attack in her father and mother; Heart disease in her father and mother; Hyperlipidemia in her father; Hypertension in her mother.    Review of Systems: Review of Systems  Constitutional: Positive for malaise/fatigue.  Respiratory: Negative.   Cardiovascular: Negative.   Gastrointestinal: Negative.   Musculoskeletal: Negative.   Neurological: Negative.   Psychiatric/Behavioral: Negative.   All other systems reviewed and are negative.    PHYSICAL EXAM: VS:  BP (!) 110/58 (BP Location: Left Arm, Patient Position: Sitting, Cuff Size: Normal)   Pulse (!) 57   Ht 5\' 6"  (1.676 m)   Wt 225 lb 8 oz (102.3 kg)   BMI 36.40 kg/m  , BMI Body mass index is 36.4 kg/m. GEN: Well nourished, well developed, in no acute distress, obese  HEENT: normal  Neck: no JVD, carotid bruits, or masses Cardiac: RRR; no murmurs, rubs, or  gallops,no edema  Respiratory:  clear to auscultation bilaterally, normal work of breathing GI: soft, nontender, nondistended, + BS MS: no deformity or atrophy  Skin: warm and dry, no rash Neuro:  Strength and sensation are intact Psych: euthymic mood, full affect    Recent Labs: 02/11/2015: ALT 20; BUN 15; Creatinine, Ser 0.56; Hemoglobin 12.3; Platelets 314; Potassium 4.2; Sodium 136    Lipid Panel No results found for: CHOL, HDL, LDLCALC, TRIG    Wt Readings from Last 3 Encounters:  08/03/15 225 lb 8 oz (102.3 kg)  07/16/15 226 lb 4 oz (102.6 kg)  03/10/15 227 lb 4 oz (103.1 kg)       ASSESSMENT AND PLAN:  Carotid artery stenosis, bilateral - Plan: EKG 12-Lead 40-59% stenosis on the right Bruit auscultated She reports that she has follow-up in [place] Cholesterol above goal  Hyperlipidemia History of statin intolerance Previous lipid panel May 2017 total cholesterol 206, LDL 126  Labile blood pressure Long discussion concerning her blood pressure and management She would like to decrease her carvedilol  down to 25 mg twice a day Recommended if she does is that she monitor her blood pressure May need higher dose hydralazine Currently blood pressure within acceptable range She does hold the hydralazine for low blood pressure, may need to take extra hydralazine for high blood pressure  Shortness of breath Chronic mild shortness of breath secondary to deconditioning Recommended regular exercise program  Morbid obesity due to excess calories ([place]) We have encouraged continued exercise, careful diet management in an effort to lose weight.    Total encounter time more than 25 minutes  Greater than 50% was spent in counseling and coordination of care with the patient   Disposition:   F/U  12 months   Orders Placed This Encounter  Procedures  . EKG 12-Lead     Signed, Esmond Plants, M.D., Ph.D. 08/03/2015  [place], [place]

## 2015-08-03 NOTE — Assessment & Plan Note (Signed)
Patient with known history of asthma. Followed closely by Dr. Carmelina Peal. Currently not using any inhalers, patient states that he does not give her great benefit at this time and does not think that she needs it. She has had full PFTs done in the past. At this time because she is asymptomatic and has very minimal symptoms, she just has a rescue inhaler. She has a standing prescription for an anticholinergic inhaler which she will fill if she thinks she needs it.  Plan: -In office spirometry prior to follow-up visit -Rescue inhaler as needed

## 2015-08-05 ENCOUNTER — Encounter: Payer: Self-pay | Admitting: Family

## 2015-08-13 ENCOUNTER — Encounter: Payer: Self-pay | Admitting: Family

## 2015-08-13 ENCOUNTER — Ambulatory Visit (INDEPENDENT_AMBULATORY_CARE_PROVIDER_SITE_OTHER): Payer: Medicare Other | Admitting: Family

## 2015-08-13 ENCOUNTER — Ambulatory Visit (HOSPITAL_COMMUNITY)
Admission: RE | Admit: 2015-08-13 | Discharge: 2015-08-13 | Disposition: A | Discharge status: Home / Self Care | Payer: Medicare Other | Source: Ambulatory Visit | Attending: Family | Admitting: Family

## 2015-08-13 VITALS — BP 116/70 | HR 69 | Temp 97.6°F | Resp 16 | Ht 66.0 in | Wt 226.0 lb

## 2015-08-13 DIAGNOSIS — F329 Major depressive disorder, single episode, unspecified: Secondary | ICD-10-CM | POA: Diagnosis not present

## 2015-08-13 DIAGNOSIS — F419 Anxiety disorder, unspecified: Secondary | ICD-10-CM | POA: Insufficient documentation

## 2015-08-13 DIAGNOSIS — I10 Essential (primary) hypertension: Secondary | ICD-10-CM | POA: Diagnosis not present

## 2015-08-13 DIAGNOSIS — E669 Obesity, unspecified: Secondary | ICD-10-CM | POA: Diagnosis not present

## 2015-08-13 DIAGNOSIS — I6523 Occlusion and stenosis of bilateral carotid arteries: Secondary | ICD-10-CM

## 2015-08-13 DIAGNOSIS — E785 Hyperlipidemia, unspecified: Secondary | ICD-10-CM | POA: Diagnosis not present

## 2015-08-13 DIAGNOSIS — J449 Chronic obstructive pulmonary disease, unspecified: Secondary | ICD-10-CM | POA: Insufficient documentation

## 2015-08-13 DIAGNOSIS — G4733 Obstructive sleep apnea (adult) (pediatric): Secondary | ICD-10-CM | POA: Insufficient documentation

## 2015-08-13 DIAGNOSIS — E119 Type 2 diabetes mellitus without complications: Secondary | ICD-10-CM | POA: Insufficient documentation

## 2015-08-13 LAB — VAS US CAROTID
LEFT ECA DIAS: -7 cm/s
LEFT VERTEBRAL DIAS: -10 cm/s
LICAPDIAS: -21 cm/s
LICAPSYS: -89 cm/s
Left CCA dist dias: -17 cm/s
Left CCA dist sys: -111 cm/s
Left CCA prox dias: 18 cm/s
Left CCA prox sys: 131 cm/s
Left ICA dist dias: -28 cm/s
Left ICA dist sys: -103 cm/s
RCCAPDIAS: 8 cm/s
RCCAPSYS: 89 cm/s
RIGHT CCA MID DIAS: -11 cm/s
RIGHT ECA DIAS: 17 cm/s
RIGHT VERTEBRAL DIAS: -13 cm/s
Right cca dist sys: -72 cm/s

## 2015-08-13 NOTE — Progress Notes (Signed)
Chief Complaint: Follow up Extracranial Carotid Artery Stenosis   History of Present Illness  Amber Welch is a 81 y.o. female patient of Dr. Bridgett Larsson who has known carotid artery stenosis and returns today for follow up.  She has not had previous carotid artery intervention.  She has no history of TIA or stroke symptoms. Specifically she denies a history of amaurosis fugax or monocular blindness, unilateral facial drooping, hemiplegia, or receptive or expressive aphasia.   Pt has generalized arthritis.  She has labile blood pressure; had a renal artery duplex in March of 2017 requested by Dr. Rockey Situ, results are below.  Her walking is limited by her painful feet, she reports having had several surgeries on her feet.  Pt Diabetic: Yes, in good control, states last A1C was 6.? Pt smoker: non-smoker  Pt meds include: Statin : No: had myalgias from simvastatin ASA: Yes Other anticoagulants/antiplatelets: no   Past Medical History:  Diagnosis Date  . Anxiety   . Arthritis    Back and knees  . Asthma    a. chronic SOB  . Carotid artery disease ([place])    a. carotid doppler 2016: [id number] RICA, [id number] LICA  . COPD (chronic obstructive pulmonary disease) ([place])   . Depression   . Diabetes type 2, controlled ([place])   . Hyperlipidemia   . Insomnia   . Labile hypertension    a. when she is stressed BP will increase to the 123456 systolic  . Medication intolerance   . Morbid obesity ([place])   . Normal coronary arteries    a. normal cath 2003; b. normal Myoview in 2008 & 2009  . OSA (obstructive sleep apnea)    a. BiPAP  . Physical deconditioning     Social History Social History  Substance Use Topics  . Smoking status: Never Smoker  . Smokeless tobacco: Never Used     Comment: tobacco use - no  . Alcohol use No    Family History Family History  Problem Relation Age of Onset  . Heart disease Father     Heart Disease before age 14  . Diabetes Father   . Hyperlipidemia  Father   . Heart attack Father   . Heart disease Mother   . Hypertension Mother   . Heart attack Mother     Surgical History Past Surgical History:  Procedure Laterality Date  . COLONOSCOPY WITH PROPOFOL N/A 07/31/2014   Procedure: COLONOSCOPY WITH PROPOFOL;  Surgeon: Hulen Luster, MD;  Location: [facility] ENDOSCOPY;  Service: Gastroenterology;  Laterality: N/A;  . DILATION AND CURETTAGE OF UTERUS    . FOOT SURGERY     Left foot, bunionectomy and hammer toe  . JOINT REPLACEMENT  06/05/11   Right knee replaced  . JOINT REPLACEMENT  09/20/11   Left knee  . right foot surgery Right April 2015   Right Foot and Ankle  . right knee surgery     arthroscopy for torn meniscus    Allergies  Allergen Reactions  . Penicillins Swelling, Rash and Other (See Comments)    [name], facial/tongue/throat swelling, [name], then may proceed with Cephalosporin use.   Marland Kitchen  Ace Inhibitors Other (See Comments)    Wasn't sleeping well  . Celebrex [Celecoxib] Cough  . Statins Other (See Comments)    Joint pain  . Iodinated Diagnostic Agents Swelling and Rash    Swelling of the ankles  . Norpramin [Desipramine] Rash  . Phenobarbital Swelling and Rash    Swelling of the ankles  . Sinequan [Doxepin] Rash    Current Outpatient Prescriptions  Medication Sig Dispense Refill  . albuterol (VENTOLIN HFA) 108 (90 Base) MCG/ACT inhaler Inhale 2 puffs into the lungs every 4 (four) hours as needed for wheezing. UAD 1 Inhaler 12  . ALPRAZolam (XANAX) 0.5 MG tablet Take 0.25-0.5 mg by mouth at bedtime as needed for sleep.    Marland Kitchen aspirin 81 MG tablet Take 81 mg by  mouth daily.    . Azelastine HCl (ASTEPRO NA) Place 1 spray into the nose at bedtime as needed (congestion).     . Azilsartan Medoxomil (EDARBI) 80 MG TABS Take 1 tablet by mouth daily.     . carvedilol (COREG) 12.5 MG tablet Take 37.5 mg by mouth 2 (two) times daily.     Marland Kitchen esomeprazole (NEXIUM) 40 MG capsule Take 40 mg by mouth daily before breakfast.      . ferrous sulfate 325 (65 FE) MG tablet Take 325 mg by mouth 2 (two) times daily with a meal.    . gabapentin (NEURONTIN) 100 MG capsule Take 300 mg by mouth at bedtime.     . hydrALAZINE (APRESOLINE) 25 MG tablet Take 1 tablet (25 mg total) by mouth 4 (four) times daily. 120 tablet 11  . LORazepam (ATIVAN) 1 MG tablet Take 1 mg by mouth at bedtime.    . magnesium gluconate (MAGONATE) 500 MG tablet Take 500 mg by mouth daily.    . Melatonin 5 MG CAPS Take 1 capsule by mouth at bedtime as needed (sleep).     . metFORMIN (GLUCOPHAGE) 500 MG tablet Take 500 mg by mouth daily.      . mometasone (NASONEX) 50 MCG/ACT nasal spray Place 2 sprays into the nose daily as needed (allergies).     . nitroGLYCERIN (NITROSTAT) 0.4 MG SL tablet Place 1 tablet (0.4 mg total) under the tongue every 5 (five) minutes as needed for chest pain. 25 tablet 6  . triamterene-hydrochlorothiazide (MAXZIDE) 75-50 MG tablet Take 1 tablet by mouth daily. 30 tablet 11   No current facility-administered medications for this visit.     Review of Systems : See HPI for pertinent positives and negatives.  Physical Examination  Vitals:   08/13/15 1454 08/13/15 1457  BP: 127/69 116/70  Pulse: 69   Resp: 16   Temp: 97.6 F (36.4 C)   TempSrc: Oral   SpO2: 95%   Weight: 226 lb (102.5 kg)   Height: 5\' 6"  (1.676 m)    Body mass index is 36.48 kg/m.  General: WDWN obese female in NAD GAIT:  antalgic Eyes: PERRLA Pulmonary: Respirations are non-labored, CTAB, good air movement. Cardiac: regular rhythm, no detected murmur.  VASCULAR EXAM Carotid Bruits Right  Left   Positive positive   Radial pulses are 2+ palpable and equal.      LE Pulses Right Left   POPLITEAL not palpable  not palpable   POSTERIOR TIBIAL 2+ palpable  2+ palpable    DORSALIS PEDIS  ANTERIOR TIBIAL 2+ palpable  2+ palpable     Gastrointestinal: soft, nontender, BS WNL, no r/g,no palpable masses.  Musculoskeletal: No muscle atrophy/wasting. M/S 5/5  in UE's, 4/5 in LE's, Extremities without ischemic changes.  Neurologic: A&O X 3; Appropriate Affect, Speech is normal CN 2-12 intact, Pain and light touch intact in extremities, Motor exam as listed above.   03/11/15 renal artery duplex: Normal caliber abdominal aorta. Normal bilateral kidney size. Echogenic structure in the mid to upper right kidney, as described above. Normal renal arteries, bilaterally, serpentine, which may account for elevated velocity in the mid left renal artery. The IVC and renal veins are patent.   Non-Invasive Vascular Imaging CAROTID DUPLEX 08/13/2015   CEREBROVASCULAR DUPLEX EVALUATION    INDICATION: Carotid artery disease    PREVIOUS INTERVENTION(S): None    DUPLEX EXAM: Carotid duplex    RIGHT  LEFT  Peak Systolic Velocities (cm/s) End Diastolic Velocities (cm/s) Plaque LOCATION Peak Systolic Velocities (cm/s) End Diastolic Velocities (cm/s) Plaque  89 8  CCA PROXIMAL 131 18   108 11  CCA MID 130 23   80 11 HT CCA DISTAL 111 17   257 17 HT ECA 155 7   287 40 HT ICA PROXIMAL 89 21 HT  90 17  ICA MID 90 20   72 20  ICA DISTAL 103 28     2.6 ICA / CCA Ratio (PSV)   Antegrade Vertebral Flow Antegrade  - Brachial Systolic Pressure (mmHg) -  Triphasic Brachial Artery Waveforms Triphasic    Plaque Morphology:  HM = Homogeneous, HT = Heterogeneous, CP = Calcific Plaque, SP = Smooth Plaque, IP = Irregular Plaque      ADDITIONAL FINDINGS: Multiphasic subclavian arteries    IMPRESSION: 1. 1 - 39%  right  internal carotid artery stenosis, higher end of range. 2. 1 - 39% left internal carotid artery stenosis 3. Right external carotid artery stenosis    Compared to the previous exam:  Increased velocity on the right since exam of 08/07/2014       Assessment: Amber Welch is a 81 y.o. female who has no history of stroke or TIA. Today's carotid Duplex suggests bilateral internal carotid arteries with <40% stenosis, right ICA stenosis at the higher end of the range. Increased velocity on the right since exam of 08/07/2014.   Her atherosclerotic risk factors include currently controlled DM, obesity, statin intolerance, and limited mobility.  Fortunately she has never used tobacco. She takes a daily ASA.   Plan: Follow-up in 1 year with Carotid Duplex scan.   I discussed in depth with the patient the nature of atherosclerosis, and emphasized the importance of maximal medical management including strict control of blood pressure, blood glucose, and lipid levels, obtaining regular exercise, and continued cessation of smoking.  The patient is aware that without maximal medical management the underlying atherosclerotic disease process will progress, limiting the benefit of any interventions. The patient was given information about stroke prevention and what symptoms should prompt the patient to seek immediate medical care. Thank you for allowing [place] to participate in this patient's care.  Clemon Chambers, RN, MSN, FNP-C [facility] Office: [phone]  Clinic Physician: Donzetta Matters on call  08/13/15 3:02 PM

## 2015-08-13 NOTE — Patient Instructions (Signed)
Stroke Prevention Some medical conditions and behaviors are associated with an increased chance of having a stroke. You may prevent a stroke by making healthy choices and managing medical conditions. HOW CAN I REDUCE MY RISK OF HAVING A STROKE?   Stay physically active. Get at least 30 minutes of activity on most or all days.  Do not smoke. It may also be helpful to avoid exposure to secondhand smoke.  Limit alcohol use. Moderate alcohol use is considered to be:  No more than 2 drinks per day for men.  No more than 1 drink per day for nonpregnant women.  Eat healthy foods. This involves:  Eating 5 or more servings of fruits and vegetables a day.  Making dietary changes that address high blood pressure (hypertension), high cholesterol, diabetes, or obesity.  Manage your cholesterol levels.  Making food choices that are high in fiber and low in saturated fat, trans fat, and cholesterol may control cholesterol levels.  Take any prescribed medicines to control cholesterol as directed by your health care provider.  Manage your diabetes.  Controlling your carbohydrate and sugar intake is recommended to manage diabetes.  Take any prescribed medicines to control diabetes as directed by your health care provider.  Control your hypertension.  Making food choices that are low in salt (sodium), saturated fat, trans fat, and cholesterol is recommended to manage hypertension.  Ask your health care provider if you need treatment to lower your blood pressure. Take any prescribed medicines to control hypertension as directed by your health care provider.  If you are 18-39 years of age, have your blood pressure checked every 3-5 years. If you are 40 years of age or older, have your blood pressure checked every year.  Maintain a healthy weight.  Reducing calorie intake and making food choices that are low in sodium, saturated fat, trans fat, and cholesterol are recommended to manage  weight.  Stop drug abuse.  Avoid taking birth control pills.  Talk to your health care provider about the risks of taking birth control pills if you are over 35 years old, smoke, get migraines, or have ever had a blood clot.  Get evaluated for sleep disorders (sleep apnea).  Talk to your health care provider about getting a sleep evaluation if you snore a lot or have excessive sleepiness.  Take medicines only as directed by your health care provider.  For some people, aspirin or blood thinners (anticoagulants) are helpful in reducing the risk of forming abnormal blood clots that can lead to stroke. If you have the irregular heart rhythm of atrial fibrillation, you should be on a blood thinner unless there is a good reason you cannot take them.  Understand all your medicine instructions.  Make sure that other conditions (such as anemia or atherosclerosis) are addressed. SEEK IMMEDIATE MEDICAL CARE IF:   You have sudden weakness or numbness of the face, arm, or leg, especially on one side of the body.  Your face or eyelid droops to one side.  You have sudden confusion.  You have trouble speaking (aphasia) or understanding.  You have sudden trouble seeing in one or both eyes.  You have sudden trouble walking.  You have dizziness.  You have a loss of balance or coordination.  You have a sudden, severe headache with no known cause.  You have new chest pain or an irregular heartbeat. Any of these symptoms may represent a serious problem that is an emergency. Do not wait to see if the symptoms will   go away. Get medical help at once. Call your local emergency services (911 in [place] Do not drive yourself to the hospital.   This information is not intended to replace advice given to you by your health care provider. Make sure you discuss any questions you have with your health care provider.   Document Released: 01/27/2004 Document Revised: 01/09/2014 Document Reviewed:  06/21/2012 Elsevier Interactive Patient Education 2016 Elsevier Inc.  

## 2015-08-23 ENCOUNTER — Telehealth: Payer: Self-pay | Admitting: Cardiovascular Disease

## 2015-08-23 NOTE — Telephone Encounter (Signed)
Spoke w/ pt.  She reports that she has not taken hydralazine in the past 2-3 days 2/2 low am BPs. Coreg was decreased at last ov to 25 mg BID. While in the early morning church service yesterday pt "felt faint and couldn't see very well", so she left. BP after arriving back home: 104/52, HR 62. She reports the following readings for the past 5 days: Sun 8/20 10:20  104/52 1:32 137/92 6:40 176/73 Sat 8/19 8:00 120/62  10:00 102/68 (after Coreg 25 mg) Fri 8/18 7:50 168/93 10:26 124/60 (after Coreg 25 mg) Thurs 8/17 6:15 166/85 10:00 139/55 (did not take Coreg this am) Wed 8/16 9:35 137/69 (did not take Coreg) 4:15 197/133 rechecked 15 mins later 165/78 (took maxzide & coreg) 11:00 110/89  She is more concerned about low DBP in the am and sx of feeling faint.  She would like to know if any med changes can be made to make her feel better. Advised her that I will make Dr. Rockey Situ aware of her readings and call her back w/ his recommendations.

## 2015-08-23 NOTE — Telephone Encounter (Signed)
Pt c/o BP issue: STAT if pt c/o blurred vision, one-sided weakness or slurred speech  1. What are your last 5 BP readings?  10;22 98/41, this is after walking with her husband.  Just a little while ago 107/44, HR 66 6:4p pm last night 176/73-took 2 Carvedilol at this time. 10:08 136/66, HR 66  2. Are you having any other symptoms (ex. Dizziness, headache, blurred vision, passed out)? Yesterday at church she felt "faint".   3. What is your BP issue? low

## 2015-08-24 NOTE — Telephone Encounter (Signed)
Would decrease coreg down to 12.5 mg twice a day Down from 25 mg

## 2015-08-25 MED ORDER — CARVEDILOL 12.5 MG PO TABS: 12.5000 mg | ORAL_TABLET | Freq: Two times a day (BID) | ORAL | 6 refills | Status: DC

## 2015-08-25 NOTE — Telephone Encounter (Signed)
Spoke with patient and reviewed Dr. Gwenyth Ober recommendations. Instructed her to decrease coreg to 12.5 mg twice a day and she verbalized understanding with no further questions. She does report a headache and some sinus pressure which she purchased some over the counter medications to try. Instructed her to continue monitoring her blood pressures and give [place] a call if she has any further needs. She verbalized understanding of our conversation and had no further questions.

## 2015-08-25 NOTE — Telephone Encounter (Signed)
Left voicemail message for patient to call back.

## 2015-11-03 ENCOUNTER — Ambulatory Visit: Payer: Medicare Other | Admitting: Cardiovascular Disease

## 2015-11-24 NOTE — Addendum Note (Signed)
Addended by: Lianne Cure A on: 11/24/2015 02:56 PM   Modules accepted: Orders

## 2016-01-31 ENCOUNTER — Other Ambulatory Visit: Payer: Self-pay | Admitting: Internal Medicine

## 2016-01-31 DIAGNOSIS — Z1231 Encounter for screening mammogram for malignant neoplasm of breast: Secondary | ICD-10-CM

## 2016-02-03 ENCOUNTER — Encounter: Payer: Self-pay | Admitting: *Deleted

## 2016-02-03 ENCOUNTER — Emergency Department: Payer: Medicare Other

## 2016-02-03 ENCOUNTER — Other Ambulatory Visit: Payer: Self-pay

## 2016-02-03 ENCOUNTER — Inpatient Hospital Stay
Admission: EM | Admit: 2016-02-03 | Discharge: 2016-02-07 | DRG: 641 | Disposition: A | Discharge status: Home / Self Care | Payer: Medicare Other | Attending: Internal Medicine | Admitting: Internal Medicine

## 2016-02-03 DIAGNOSIS — G4733 Obstructive sleep apnea (adult) (pediatric): Secondary | ICD-10-CM | POA: Diagnosis present

## 2016-02-03 DIAGNOSIS — R112 Nausea with vomiting, unspecified: Secondary | ICD-10-CM | POA: Diagnosis not present

## 2016-02-03 DIAGNOSIS — J449 Chronic obstructive pulmonary disease, unspecified: Secondary | ICD-10-CM | POA: Diagnosis present

## 2016-02-03 DIAGNOSIS — E871 Hypo-osmolality and hyponatremia: Secondary | ICD-10-CM | POA: Diagnosis not present

## 2016-02-03 DIAGNOSIS — E860 Dehydration: Secondary | ICD-10-CM | POA: Diagnosis present

## 2016-02-03 DIAGNOSIS — Z79899 Other long term (current) drug therapy: Secondary | ICD-10-CM

## 2016-02-03 DIAGNOSIS — E861 Hypovolemia: Secondary | ICD-10-CM | POA: Diagnosis present

## 2016-02-03 DIAGNOSIS — K219 Gastro-esophageal reflux disease without esophagitis: Secondary | ICD-10-CM | POA: Diagnosis present

## 2016-02-03 DIAGNOSIS — Z91041 Radiographic dye allergy status: Secondary | ICD-10-CM

## 2016-02-03 DIAGNOSIS — R0602 Shortness of breath: Secondary | ICD-10-CM | POA: Diagnosis present

## 2016-02-03 DIAGNOSIS — Z8249 Family history of ischemic heart disease and other diseases of the circulatory system: Secondary | ICD-10-CM

## 2016-02-03 DIAGNOSIS — M1990 Unspecified osteoarthritis, unspecified site: Secondary | ICD-10-CM | POA: Diagnosis present

## 2016-02-03 DIAGNOSIS — Z7951 Long term (current) use of inhaled steroids: Secondary | ICD-10-CM

## 2016-02-03 DIAGNOSIS — R197 Diarrhea, unspecified: Secondary | ICD-10-CM

## 2016-02-03 DIAGNOSIS — E876 Hypokalemia: Secondary | ICD-10-CM | POA: Diagnosis present

## 2016-02-03 DIAGNOSIS — R0989 Other specified symptoms and signs involving the circulatory and respiratory systems: Secondary | ICD-10-CM

## 2016-02-03 DIAGNOSIS — Z888 Allergy status to other drugs, medicaments and biological substances status: Secondary | ICD-10-CM

## 2016-02-03 DIAGNOSIS — Z880 Allergy status to penicillin: Secondary | ICD-10-CM

## 2016-02-03 DIAGNOSIS — G4700 Insomnia, unspecified: Secondary | ICD-10-CM | POA: Diagnosis present

## 2016-02-03 DIAGNOSIS — Z833 Family history of diabetes mellitus: Secondary | ICD-10-CM

## 2016-02-03 DIAGNOSIS — Z7984 Long term (current) use of oral hypoglycemic drugs: Secondary | ICD-10-CM

## 2016-02-03 DIAGNOSIS — K529 Noninfective gastroenteritis and colitis, unspecified: Secondary | ICD-10-CM | POA: Diagnosis present

## 2016-02-03 DIAGNOSIS — E785 Hyperlipidemia, unspecified: Secondary | ICD-10-CM | POA: Diagnosis present

## 2016-02-03 DIAGNOSIS — I10 Essential (primary) hypertension: Secondary | ICD-10-CM | POA: Diagnosis present

## 2016-02-03 DIAGNOSIS — E1142 Type 2 diabetes mellitus with diabetic polyneuropathy: Secondary | ICD-10-CM | POA: Diagnosis present

## 2016-02-03 DIAGNOSIS — F329 Major depressive disorder, single episode, unspecified: Secondary | ICD-10-CM | POA: Diagnosis present

## 2016-02-03 DIAGNOSIS — Z793 Long term (current) use of hormonal contraceptives: Secondary | ICD-10-CM

## 2016-02-03 DIAGNOSIS — F419 Anxiety disorder, unspecified: Secondary | ICD-10-CM | POA: Diagnosis present

## 2016-02-03 LAB — CBC
HCT: 35.4 % (ref 35.0–47.0)
Hemoglobin: 12.5 g/dL (ref 12.0–16.0)
MCH: 29.8 pg (ref 26.0–34.0)
MCHC: 35.2 g/dL (ref 32.0–36.0)
MCV: 84.8 fL (ref 80.0–100.0)
Platelets: 297 10*3/uL (ref 150–440)
RBC: 4.18 MIL/uL (ref 3.80–5.20)
RDW: 13.6 % (ref 11.5–14.5)
WBC: 7.1 10*3/uL (ref 3.6–11.0)

## 2016-02-03 LAB — BASIC METABOLIC PANEL
Anion gap: 8 (ref 5–15)
BUN: 18 mg/dL (ref 6–20)
CALCIUM: 8.8 mg/dL — AB (ref 8.9–10.3)
CO2: 23 mmol/L (ref 22–32)
CREATININE: 0.66 mg/dL (ref 0.44–1.00)
Chloride: 89 mmol/L — ABNORMAL LOW (ref 101–111)
GFR calc Af Amer: >60 mL/min (ref >60)
GLUCOSE: 173 mg/dL — AB (ref 65–99)
Potassium: 4.3 mmol/L (ref 3.5–5.1)
Sodium: 120 mmol/L — ABNORMAL LOW (ref 135–145)

## 2016-02-03 LAB — TROPONIN I: TROPONIN I: 0.03 ng/mL — AB (ref <0.03)

## 2016-02-03 NOTE — ED Notes (Signed)
Patient states she feels better. Husband is sitting with patient.

## 2016-02-03 NOTE — ED Triage Notes (Signed)
Pt sent from [place] for eval of n/v/d.  Pt also has a cough for 3 weeks. Pt reports feeling weak.  Blood pressure elevated.  Pt reports taking meds today.   Pt alert, speech clear.

## 2016-02-04 ENCOUNTER — Encounter: Payer: Self-pay | Admitting: Internal Medicine

## 2016-02-04 ENCOUNTER — Emergency Department: Payer: Medicare Other

## 2016-02-04 DIAGNOSIS — Z7951 Long term (current) use of inhaled steroids: Secondary | ICD-10-CM | POA: Diagnosis not present

## 2016-02-04 DIAGNOSIS — K529 Noninfective gastroenteritis and colitis, unspecified: Secondary | ICD-10-CM | POA: Diagnosis present

## 2016-02-04 DIAGNOSIS — F329 Major depressive disorder, single episode, unspecified: Secondary | ICD-10-CM | POA: Diagnosis present

## 2016-02-04 DIAGNOSIS — Z880 Allergy status to penicillin: Secondary | ICD-10-CM | POA: Diagnosis not present

## 2016-02-04 DIAGNOSIS — Z8249 Family history of ischemic heart disease and other diseases of the circulatory system: Secondary | ICD-10-CM | POA: Diagnosis not present

## 2016-02-04 DIAGNOSIS — E871 Hypo-osmolality and hyponatremia: Secondary | ICD-10-CM | POA: Diagnosis present

## 2016-02-04 DIAGNOSIS — E860 Dehydration: Secondary | ICD-10-CM | POA: Diagnosis present

## 2016-02-04 DIAGNOSIS — E1142 Type 2 diabetes mellitus with diabetic polyneuropathy: Secondary | ICD-10-CM | POA: Diagnosis present

## 2016-02-04 DIAGNOSIS — J449 Chronic obstructive pulmonary disease, unspecified: Secondary | ICD-10-CM | POA: Diagnosis present

## 2016-02-04 DIAGNOSIS — Z91041 Radiographic dye allergy status: Secondary | ICD-10-CM | POA: Diagnosis not present

## 2016-02-04 DIAGNOSIS — Z888 Allergy status to other drugs, medicaments and biological substances status: Secondary | ICD-10-CM | POA: Diagnosis not present

## 2016-02-04 DIAGNOSIS — F419 Anxiety disorder, unspecified: Secondary | ICD-10-CM | POA: Diagnosis present

## 2016-02-04 DIAGNOSIS — G4700 Insomnia, unspecified: Secondary | ICD-10-CM | POA: Diagnosis present

## 2016-02-04 DIAGNOSIS — G4733 Obstructive sleep apnea (adult) (pediatric): Secondary | ICD-10-CM | POA: Diagnosis present

## 2016-02-04 DIAGNOSIS — K219 Gastro-esophageal reflux disease without esophagitis: Secondary | ICD-10-CM | POA: Diagnosis present

## 2016-02-04 DIAGNOSIS — M1990 Unspecified osteoarthritis, unspecified site: Secondary | ICD-10-CM | POA: Diagnosis present

## 2016-02-04 DIAGNOSIS — E876 Hypokalemia: Secondary | ICD-10-CM | POA: Diagnosis present

## 2016-02-04 DIAGNOSIS — I10 Essential (primary) hypertension: Secondary | ICD-10-CM | POA: Diagnosis present

## 2016-02-04 DIAGNOSIS — R112 Nausea with vomiting, unspecified: Secondary | ICD-10-CM | POA: Diagnosis present

## 2016-02-04 DIAGNOSIS — E861 Hypovolemia: Secondary | ICD-10-CM | POA: Diagnosis present

## 2016-02-04 DIAGNOSIS — E785 Hyperlipidemia, unspecified: Secondary | ICD-10-CM | POA: Diagnosis present

## 2016-02-04 DIAGNOSIS — Z793 Long term (current) use of hormonal contraceptives: Secondary | ICD-10-CM | POA: Diagnosis not present

## 2016-02-04 DIAGNOSIS — Z833 Family history of diabetes mellitus: Secondary | ICD-10-CM | POA: Diagnosis not present

## 2016-02-04 DIAGNOSIS — R0602 Shortness of breath: Secondary | ICD-10-CM | POA: Diagnosis present

## 2016-02-04 DIAGNOSIS — Z7984 Long term (current) use of oral hypoglycemic drugs: Secondary | ICD-10-CM | POA: Diagnosis not present

## 2016-02-04 LAB — URINALYSIS, COMPLETE (UACMP) WITH MICROSCOPIC
BACTERIA UA: NONE SEEN
Bilirubin Urine: NEGATIVE
Glucose, UA: 50 mg/dL — AB
KETONES UR: 20 mg/dL — AB
LEUKOCYTES UA: NEGATIVE
NITRITE: NEGATIVE
PH: 6 (ref 5.0–8.0)
Protein, ur: 30 mg/dL — AB
SPECIFIC GRAVITY, URINE: 1.013 (ref 1.005–1.030)

## 2016-02-04 LAB — CBC
HCT: 34.9 % — ABNORMAL LOW (ref 35.0–47.0)
HEMOGLOBIN: 12.2 g/dL (ref 12.0–16.0)
MCH: 29.8 pg (ref 26.0–34.0)
MCHC: 35 g/dL (ref 32.0–36.0)
MCV: 85.1 fL (ref 80.0–100.0)
PLATELETS: 285 10*3/uL (ref 150–440)
RBC: 4.1 MIL/uL (ref 3.80–5.20)
RDW: 13.8 % (ref 11.5–14.5)
WBC: 8.5 10*3/uL (ref 3.6–11.0)

## 2016-02-04 LAB — BASIC METABOLIC PANEL
ANION GAP: 9 (ref 5–15)
ANION GAP: 9 (ref 5–15)
ANION GAP: 8 (ref 5–15)
Anion gap: 7 (ref 5–15)
BUN: 15 mg/dL (ref 6–20)
BUN: 14 mg/dL (ref 6–20)
BUN: 14 mg/dL (ref 6–20)
BUN: 15 mg/dL (ref 6–20)
CALCIUM: 8.2 mg/dL — AB (ref 8.9–10.3)
CALCIUM: 8 mg/dL — AB (ref 8.9–10.3)
CALCIUM: 8.4 mg/dL — AB (ref 8.9–10.3)
CALCIUM: 8.2 mg/dL — AB (ref 8.9–10.3)
CO2: 24 mmol/L (ref 22–32)
CO2: 23 mmol/L (ref 22–32)
CO2: 23 mmol/L (ref 22–32)
CO2: 22 mmol/L (ref 22–32)
CREATININE: 0.56 mg/dL (ref 0.44–1.00)
CREATININE: 0.57 mg/dL (ref 0.44–1.00)
CREATININE: 0.56 mg/dL (ref 0.44–1.00)
CREATININE: 0.64 mg/dL (ref 0.44–1.00)
Chloride: 89 mmol/L — ABNORMAL LOW (ref 101–111)
Chloride: 89 mmol/L — ABNORMAL LOW (ref 101–111)
Chloride: 88 mmol/L — ABNORMAL LOW (ref 101–111)
Chloride: 89 mmol/L — ABNORMAL LOW (ref 101–111)
GFR calc Af Amer: >60 mL/min (ref >60)
GFR calc Af Amer: >60 mL/min (ref >60)
GFR calc non Af Amer: >60 mL/min (ref >60)
GLUCOSE: 163 mg/dL — AB (ref 65–99)
Glucose, Bld: 149 mg/dL — ABNORMAL HIGH (ref 65–99)
Glucose, Bld: 143 mg/dL — ABNORMAL HIGH (ref 65–99)
Glucose, Bld: 159 mg/dL — ABNORMAL HIGH (ref 65–99)
Potassium: 3.6 mmol/L (ref 3.5–5.1)
Potassium: 3.3 mmol/L — ABNORMAL LOW (ref 3.5–5.1)
Potassium: 3.3 mmol/L — ABNORMAL LOW (ref 3.5–5.1)
Potassium: 3.3 mmol/L — ABNORMAL LOW (ref 3.5–5.1)
SODIUM: 120 mmol/L — AB (ref 135–145)
SODIUM: 121 mmol/L — AB (ref 135–145)
SODIUM: 120 mmol/L — AB (ref 135–145)
Sodium: 119 mmol/L — CL (ref 135–145)

## 2016-02-04 LAB — HEPATIC FUNCTION PANEL
ALBUMIN: 4 g/dL (ref 3.5–5.0)
ALK PHOS: 77 U/L (ref 38–126)
ALT: 21 U/L (ref 14–54)
AST: 24 U/L (ref 15–41)
BILIRUBIN TOTAL: 0.5 mg/dL (ref 0.3–1.2)
Bilirubin, Direct: <0.1 mg/dL — ABNORMAL LOW (ref 0.1–0.5)
Total Protein: 7.5 g/dL (ref 6.5–8.1)

## 2016-02-04 LAB — LIPASE, BLOOD: LIPASE: 34 U/L (ref 11–51)

## 2016-02-04 LAB — CREATININE, SERUM
CREATININE: 0.56 mg/dL (ref 0.44–1.00)
GFR calc Af Amer: >60 mL/min (ref >60)

## 2016-02-04 LAB — INFLUENZA PANEL BY PCR (TYPE A & B)
INFLAPCR: NEGATIVE
Influenza B By PCR: NEGATIVE

## 2016-02-04 MED ORDER — ACETAMINOPHEN 650 MG RE SUPP: 650.0000 mg | Freq: Four times a day (QID) | RECTAL | Status: DC | PRN

## 2016-02-04 MED ORDER — HYDRALAZINE HCL 20 MG/ML IJ SOLN
10.0000 mg | INTRAMUSCULAR | Status: DC | PRN
  Administered 2016-02-04: 10 mg via INTRAVENOUS
  Filled 2016-02-04: qty 1

## 2016-02-04 MED ORDER — SODIUM CHLORIDE 0.9 % IV SOLN
INTRAVENOUS | Status: DC
  Administered 2016-02-04 – 2016-02-06 (×3): via INTRAVENOUS

## 2016-02-04 MED ORDER — SODIUM CHLORIDE 0.9 % IV BOLUS (SEPSIS)
1000.0000 mL | Freq: Once | INTRAVENOUS | Status: AC
  Administered 2016-02-04: 1000 mL via INTRAVENOUS

## 2016-02-04 MED ORDER — LORAZEPAM 1 MG PO TABS
1.0000 mg | ORAL_TABLET | Freq: Every day | ORAL | Status: DC
  Administered 2016-02-04 – 2016-02-06 (×3): 1 mg via ORAL
  Filled 2016-02-04 (×3): qty 1

## 2016-02-04 MED ORDER — CARVEDILOL 6.25 MG PO TABS
12.5000 mg | ORAL_TABLET | Freq: Two times a day (BID) | ORAL | Status: DC
  Administered 2016-02-04 – 2016-02-07 (×7): 12.5 mg via ORAL
  Filled 2016-02-04 (×8): qty 2

## 2016-02-04 MED ORDER — HYDRALAZINE HCL 50 MG PO TABS
25.0000 mg | ORAL_TABLET | Freq: Once | ORAL | Status: AC
  Administered 2016-02-04: 25 mg via ORAL
  Filled 2016-02-04: qty 1

## 2016-02-04 MED ORDER — ALBUTEROL SULFATE (2.5 MG/3ML) 0.083% IN NEBU: 3.0000 mL | INHALATION_SOLUTION | RESPIRATORY_TRACT | Status: DC | PRN

## 2016-02-04 MED ORDER — AZELASTINE HCL 0.15 % NA SOLN
Freq: Every evening | NASAL | Status: DC | PRN
Start: 2016-02-04 — End: 2016-02-07
  Filled 2016-02-04: qty 30

## 2016-02-04 MED ORDER — PANTOPRAZOLE SODIUM 40 MG PO TBEC
40.0000 mg | DELAYED_RELEASE_TABLET | Freq: Every day | ORAL | Status: DC
  Administered 2016-02-04 – 2016-02-07 (×4): 40 mg via ORAL
  Filled 2016-02-04 (×4): qty 1

## 2016-02-04 MED ORDER — FLUTICASONE PROPIONATE 50 MCG/ACT NA SUSP
1.0000 | Freq: Every day | NASAL | Status: DC
  Administered 2016-02-05 – 2016-02-07 (×3): 1 via NASAL
  Filled 2016-02-04: qty 16

## 2016-02-04 MED ORDER — ASPIRIN EC 81 MG PO TBEC
81.0000 mg | DELAYED_RELEASE_TABLET | Freq: Every day | ORAL | Status: DC
Start: 2016-02-04 — End: 2016-02-07
  Administered 2016-02-04 – 2016-02-07 (×4): 81 mg via ORAL
  Filled 2016-02-04 (×4): qty 1

## 2016-02-04 MED ORDER — FERROUS SULFATE 325 (65 FE) MG PO TABS
325.0000 mg | ORAL_TABLET | Freq: Two times a day (BID) | ORAL | Status: DC
  Administered 2016-02-05 – 2016-02-07 (×3): 325 mg via ORAL
  Filled 2016-02-04 (×5): qty 1

## 2016-02-04 MED ORDER — PROMETHAZINE HCL 12.5 MG PO TABS
6.2500 mg | ORAL_TABLET | Freq: Once | ORAL | Status: DC
  Filled 2016-02-04: qty 1

## 2016-02-04 MED ORDER — ONDANSETRON HCL 4 MG/2ML IJ SOLN
4.0000 mg | Freq: Once | INTRAMUSCULAR | Status: AC
  Administered 2016-02-04: 4 mg via INTRAVENOUS
  Filled 2016-02-04: qty 2

## 2016-02-04 MED ORDER — HYDRALAZINE HCL 25 MG PO TABS
25.0000 mg | ORAL_TABLET | Freq: Four times a day (QID) | ORAL | Status: DC
  Administered 2016-02-04 – 2016-02-07 (×11): 25 mg via ORAL
  Filled 2016-02-04 (×11): qty 1

## 2016-02-04 MED ORDER — NITROGLYCERIN 0.4 MG SL SUBL: 0.4000 mg | SUBLINGUAL_TABLET | SUBLINGUAL | Status: DC | PRN

## 2016-02-04 MED ORDER — ALPRAZOLAM 0.5 MG PO TABS: 0.2500 mg | ORAL_TABLET | Freq: Every evening | ORAL | Status: DC | PRN

## 2016-02-04 MED ORDER — GABAPENTIN 300 MG PO CAPS
300.0000 mg | ORAL_CAPSULE | Freq: Every day | ORAL | Status: DC
  Administered 2016-02-04 – 2016-02-06 (×3): 300 mg via ORAL
  Filled 2016-02-04 (×3): qty 1

## 2016-02-04 MED ORDER — ENOXAPARIN SODIUM 40 MG/0.4ML ~~LOC~~ SOLN
40.0000 mg | SUBCUTANEOUS | Status: DC
  Administered 2016-02-04 – 2016-02-07 (×4): 40 mg via SUBCUTANEOUS
  Filled 2016-02-04 (×4): qty 0.4

## 2016-02-04 MED ORDER — MELATONIN 5 MG PO TABS
5.0000 mg | ORAL_TABLET | Freq: Every evening | ORAL | Status: DC | PRN
Start: 2016-02-04 — End: 2016-02-07
  Filled 2016-02-04: qty 1

## 2016-02-04 MED ORDER — BARIUM SULFATE 2.1 % PO SUSP
450.0000 mL | ORAL | Status: AC
  Administered 2016-02-04 (×2): 450 mL via ORAL

## 2016-02-04 MED ORDER — ACETAMINOPHEN 325 MG PO TABS
650.0000 mg | ORAL_TABLET | Freq: Four times a day (QID) | ORAL | Status: DC | PRN
Start: 2016-02-04 — End: 2016-02-07
  Filled 2016-02-04: qty 2

## 2016-02-04 MED ORDER — ONDANSETRON HCL 4 MG/2ML IJ SOLN
4.0000 mg | Freq: Four times a day (QID) | INTRAMUSCULAR | Status: DC | PRN
  Administered 2016-02-04 – 2016-02-05 (×6): 4 mg via INTRAVENOUS
  Filled 2016-02-04 (×6): qty 2

## 2016-02-04 MED ORDER — ONDANSETRON HCL 4 MG PO TABS
4.0000 mg | ORAL_TABLET | Freq: Four times a day (QID) | ORAL | Status: DC | PRN
  Administered 2016-02-06: 4 mg via ORAL
  Filled 2016-02-04: qty 1

## 2016-02-04 NOTE — ED Notes (Signed)
Assisted patient to toilet.   

## 2016-02-04 NOTE — ED Provider Notes (Signed)
[facility] Emergency Department Provider Note   ____________________________________________   First MD Initiated Contact with Patient 02/03/16 2351     (approximate)  I have reviewed the triage vital signs and the nursing notes.   HISTORY  Chief Complaint Emesis; Diarrhea; and Cough    HPI Amber Welch is a 82 y.o. female here for evaluation of vomiting and nausea patient reports that she's had 3 weeks of a dry cough, and feeling fatigued. She is prescribed a new medication, Keflex" today by her primary care doctor and while taking that she started to feel nauseated this evening several times but not vomiting, and then experienced bout 5 loose watery nonbloody stools as afternoon. She didn't feeling weak and tired since that time. She is generally feels fatigued, she did have a headache for the last 3 days, but that went away and she has not expressed a headache today. She reports she has very high blood pressure, and she has been taking all of her medications as prescribed.  She denies having a fever. She did have chills and body aches a few days ago and was exposed to a gentleman who had a "cold" he was working in her home for a day before her symptoms began.  Present denies any abdominal pain. Having mild nausea but no vomiting now. No chest pain or shortness of breath. A dry nonproductive cough  Past Medical History:  Diagnosis Date  . Anxiety   . Arthritis    Back and knees  . Asthma    a. chronic SOB  . Carotid artery disease ([place])    a. carotid doppler 2016: [id number] RICA, [id number] LICA  . COPD (chronic obstructive pulmonary disease) ([place])   . Depression   . Diabetes type 2, controlled ([place])   . Hyperlipidemia   . Insomnia   . Labile hypertension    a. when she is stressed BP will increase to the 123456 systolic  . Medication intolerance   . Morbid obesity ([place])   . Normal coronary arteries    a. normal cath 2003; b. normal Myoview in 2008 & 2009   . OSA (obstructive sleep apnea)    a. BiPAP  . Physical deconditioning     Patient Active Problem List   Diagnosis Date Noted  . History of smoking 07/16/2015  . Morbid obesity ([place])   . Normal coronary arteries   . OSA (obstructive sleep apnea)   . Asthma   . Physical deconditioning   . Medication intolerance   . Anxiety   . Depression   . Insomnia   . COPD (chronic obstructive pulmonary disease) ([place]) 01/08/2015  . Labile blood pressure 05/11/2011  . Hyperlipidemia 05/11/2011  . Carotid artery stenosis 01/26/2010  . SHORTNESS OF BREATH 01/26/2010  . CHEST TIGHTNESS-PRESSURE-OTHER 01/26/2010    Past Surgical History:  Procedure Laterality Date  . COLONOSCOPY WITH PROPOFOL N/A 07/31/2014   Procedure: COLONOSCOPY WITH PROPOFOL;  Surgeon: Hulen Luster, MD;  Location: [facility] ENDOSCOPY;  Service: Gastroenterology;  Laterality: N/A;  . DILATION AND CURETTAGE OF UTERUS    . FOOT SURGERY     Left foot, bunionectomy and hammer toe  . JOINT REPLACEMENT  06/05/11   Right knee replaced  . JOINT REPLACEMENT  09/20/11   Left knee  . right foot surgery Right April 2015   Right Foot and Ankle  . right knee surgery     arthroscopy for torn meniscus    Prior to Admission medications  Medication Sig Start Date End Date Taking? Authorizing Provider  albuterol (VENTOLIN HFA) 108 (90 Base) MCG/ACT inhaler Inhale 2 puffs into the lungs every 4 (four) hours as needed for wheezing. UAD 01/08/15   Flora Lipps, MD  ALPRAZolam Duanne Moron) 0.5 MG tablet Take 0.25-0.5 mg by mouth at bedtime as needed for sleep.    Historical Provider, MD  aspirin 81 MG tablet Take 81 mg by mouth daily.    Historical Provider, MD  Azelastine HCl (ASTEPRO NA) Place 1 spray into the nose at bedtime as needed (congestion).     Historical Provider, MD  Azilsartan Medoxomil (EDARBI) 80 MG TABS Take 1 tablet by mouth daily.     Historical Provider, MD  carvedilol (COREG) 12.5 MG tablet Take 1 tablet (12.5 mg total) by mouth 2 (two)  times daily. 08/25/15 11/23/15  Minna Merritts, MD  esomeprazole (NEXIUM) 40 MG capsule Take 40 mg by mouth daily before breakfast.      Historical Provider, MD  ferrous sulfate 325 (65 FE) MG tablet Take 325 mg by mouth 2 (two) times daily with a meal.    Historical Provider, MD  gabapentin (NEURONTIN) 100 MG capsule Take 300 mg by mouth at bedtime.  07/01/11   Historical Provider, MD  hydrALAZINE (APRESOLINE) 25 MG tablet Take 1 tablet (25 mg total) by mouth 4 (four) times daily. 08/03/15   Minna Merritts, MD  LORazepam (ATIVAN) 1 MG tablet Take 1 mg by mouth at bedtime.    Historical Provider, MD  magnesium gluconate (MAGONATE) 500 MG tablet Take 500 mg by mouth daily.    Historical Provider, MD  Melatonin 5 MG CAPS Take 1 capsule by mouth at bedtime as needed (sleep).     Historical Provider, MD  metFORMIN (GLUCOPHAGE) 500 MG tablet Take 500 mg by mouth daily.      Historical Provider, MD  mometasone (NASONEX) 50 MCG/ACT nasal spray Place 2 sprays into the nose daily as needed (allergies).     Historical Provider, MD  nitroGLYCERIN (NITROSTAT) 0.4 MG SL tablet Place 1 tablet (0.4 mg total) under the tongue every 5 (five) minutes as needed for chest pain. 03/10/15 03/09/16  Minna Merritts, MD  triamterene-hydrochlorothiazide (MAXZIDE) 75-50 MG tablet Take 1 tablet by mouth daily. 07/16/15   Rise Mu, PA-C    Allergies Penicillins; Ace inhibitors; Celebrex [celecoxib]; Statins; Iodinated diagnostic agents; Norpramin [desipramine]; Phenobarbital; and Sinequan [doxepin]  Family History  Problem Relation Age of Onset  . Heart disease Father     Heart Disease before age 65  . Diabetes Father   . Hyperlipidemia Father   . Heart attack Father   . Heart disease Mother   . Hypertension Mother   . Heart attack Mother     Social History Social History  Substance Use Topics  . Smoking status: Never Smoker  . Smokeless tobacco: Never Used     Comment: tobacco use - no  . Alcohol use No     Review of Systems Constitutional: No fever/chills Eyes: No visual changes. ENT: No sore throat. Cardiovascular: Denies chest pain. Respiratory: Denies shortness of breath.See history of present illness Gastrointestinal: No abdominal pain.   No constipation. Genitourinary: Negative for dysuria. Musculoskeletal: Negative for back pain. Skin: Negative for rash. Neurological: Negative for  focal weakness or numbness.  10-point ROS otherwise negative.  ____________________________________________   PHYSICAL EXAM:  VITAL SIGNS: ED Triage Vitals  Enc Vitals Group     BP 02/03/16 2114 (!) 200/162  Pulse Rate 02/03/16 2110 70     Resp 02/03/16 2110 20     Temp 02/03/16 2110 98.2 F (36.8 C)     Temp Source 02/03/16 2110 Oral     SpO2 02/03/16 2110 96 %     Weight 02/03/16 2110 225 lb (102.1 kg)     Height 02/03/16 2110 5\' 6"  (1.676 m)     Head Circumference --      Peak Flow --      Pain Score 02/03/16 2111 0     Pain Loc --      Pain Edu? --      Excl. in [place]? --     Constitutional: Alert and oriented. Well appearing and in no acute distress. Eyes: Conjunctivae are normal. PERRL. EOMI. Head: Atraumatic. Nose: No congestion/rhinnorhea. Mouth/Throat: Mucous membranes are moist.  Oropharynx non-erythematous. Neck: No stridor.  Cardiovascular: Normal rate, regular rhythm. Grossly normal heart sounds.  Good peripheral circulation. Respiratory: Normal respiratory effort.  No retractions. Lungs CTAB. Gastrointestinal: Soft and nontenderSet for some minimal tenderness over the left lower quadrant without rebound or guarding. No distention.  Musculoskeletal: No lower extremity tenderness nor edema.  No joint effusions. Neurologic:    The patient has no pronator drift. The patient has normal cranial nerve exam. Extraocular movements are normal. Visual fields are normal. Patient has 5 out of 5 strength in all extremities. There is no numbness or gross, acute sensory  abnormality in the extremities bilaterally. No speech disturbance. No dysarthria. No aphasia. No ataxia. Patient speaking in full and clear sentences.  Skin:  Skin is warm, dry and intact. No rash noted. Psychiatric: Mood and affect are normal. Speech and behavior are normal.  ____________________________________________   LABS (all labs ordered are listed, but only abnormal results are displayed)  Labs Reviewed  BASIC METABOLIC PANEL - Abnormal; Notable for the following:       Result Value   Sodium 120 (*)    Chloride 89 (*)    Glucose, Bld 173 (*)    Calcium 8.8 (*)    All other components within normal limits  TROPONIN I - Abnormal; Notable for the following:    Troponin I 0.03 (*)    All other components within normal limits  URINALYSIS, COMPLETE (UACMP) WITH MICROSCOPIC - Abnormal; Notable for the following:    Color, Urine YELLOW (*)    APPearance CLEAR (*)    Glucose, UA 50 (*)    Hgb urine dipstick SMALL (*)    Ketones, ur 20 (*)    Protein, ur 30 (*)    Squamous Epithelial / LPF 0-5 (*)    All other components within normal limits  HEPATIC FUNCTION PANEL - Abnormal; Notable for the following:    Bilirubin, Direct <0.1 (*)    All other components within normal limits  CBC  INFLUENZA PANEL BY PCR (TYPE A & B)  LIPASE, BLOOD  BASIC METABOLIC PANEL   ____________________________________________  EKG  ED ECG REPORT I, QUALE, MARK, the attending physician, personally viewed and interpreted this ECG.  Date: 02/04/2016 EKG Time: 2120 Rate: 65 Rhythm: normal sinus rhythm QRS Axis: normal Intervals: normal ST/T Wave abnormalities: normal Conduction Disturbances: none Narrative Interpretation: unremarkable  ____________________________________________  RADIOLOGY  Ct Abdomen Pelvis Wo Contrast  Result Date: 02/04/2016 CLINICAL DATA:  Nausea, and diarrhea.  Lower abdominal discomfort. EXAM: CT ABDOMEN AND PELVIS WITHOUT CONTRAST TECHNIQUE: Multidetector  CT imaging of the abdomen and pelvis was performed following the standard protocol without IV  contrast. COMPARISON:  CTA 10/06/2010 FINDINGS: Lower chest: Minimal smooth septal thickening in the lung bases suggesting pulmonary edema. Heart is normal in size. There are mitral annulus calcifications. Hepatobiliary: No focal lesion allowing for lack contrast. Possible tiny gallstone within physiologically distended gallbladder. No biliary dilatation. Pancreas: No ductal dilatation or inflammation. Spleen: Normal in size without focal abnormality. Adrenals/Urinary Tract: No adrenal nodule. No hydronephrosis. 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 Mild nonspecific perinephric stranding. Urinary bladder is physiologically distended. No bladder wall thickening. Stomach/Bowel: Small hiatal hernia. Enteric contrast of distal esophagus can be seen with reflux or slow transit. Stomach is physiologically distended with ingested contrast. No evidence of bowel wall thickening, distention or inflammation. Normal appendix. Minimal sigmoid colonic diverticulosis without inflammation. No colonic wall thickening. Vascular/Lymphatic: Aortic atherosclerosis. No enlarged abdominal or pelvic lymph nodes. Reproductive: Uterus and bilateral adnexa are unremarkable. Questionable phlebolith versus right ovarian calcification is unchanged. Other: No free air, free fluid, or intra-abdominal fluid collection. Tiny fat containing umbilical hernia. Musculoskeletal: Degenerative anterolisthesis of L4 on L5. Multilevel degenerative change throughout the lumbar spine. There are no acute or suspicious osseous abnormalities. IMPRESSION: 1. No explanation for diarrhea or lower abdominal discomfort. Minimal colonic diverticulosis without acute diverticulitis or colonic inflammation. 2. Small hiatal hernia containing enteric contrast, can be seen with reflux or delayed transit. 3. Possible tiny  gallstone. 4. Suspect mild pulmonary edema at the lung bases. 5. Aortic atherosclerosis without aneurysm. Electronically Signed   By: Jeb Levering M.D.   On: 02/04/2016 02:26   Dg Chest 2 View  Result Date: 02/03/2016 CLINICAL DATA:  Cough for 3 weeks.  Weakness. EXAM: CHEST  2 VIEW COMPARISON:  Report from chest radiograph 11/20/2012, no images available. FINDINGS: Lungs are hyperinflated. Mild bronchial thickening. No focal airspace disease. No pulmonary edema, pleural fluid or pneumothorax. Heart size is normal, mild atherosclerosis of the thoracic aorta. Remote right rib fractures. Degenerative change of the left greater than right shoulder. IMPRESSION: 1. Hyperinflation and mild bronchial thickening, suggesting emphysema/COPD. 2. No acute abnormality. 3. Aortic atherosclerosis. Electronically Signed   By: Jeb Levering M.D.   On: 02/03/2016 21:37   Ct Head Wo Contrast  Result Date: 02/04/2016 CLINICAL DATA:  Hypertension and weakness.  Nausea. EXAM: CT HEAD WITHOUT CONTRAST TECHNIQUE: Contiguous axial images were obtained from the base of the skull through the vertex without intravenous contrast. COMPARISON:  None. FINDINGS: Brain: There is no intracranial hemorrhage, mass or evidence of acute infarction. There is mild generalized atrophy. There is mild chronic microvascular ischemic change. There is no significant extra-axial fluid collection. No acute intracranial findings are evident. Vascular: No hyperdense vessel or unexpected calcification. Skull: Normal. Negative for fracture or focal lesion. Sinuses/Orbits: No acute finding. Other: None. IMPRESSION: No acute intracranial findings. There is mild generalized atrophy and chronic appearing white matter hypodensities which likely represent small vessel ischemic disease. Electronically Signed   By: Andreas Newport M.D.   On: 02/04/2016 02:19   ____________________________________________   PROCEDURES  Procedure(s) performed:  None  Procedures  Critical Care performed: Yes, see critical care note(s)  CRITICAL CARE Performed by: Delman Kitten   Total critical care time: 40 minutes  Critical care time was exclusive of separately billable procedures and treating other patients.  Critical care was necessary to treat or prevent imminent or life-threatening deterioration.  Critical care was time spent personally by me on the following activities: development of treatment plan with patient and/or  surrogate as well as nursing, discussions with consultants, evaluation of patient's response to treatment, examination of patient, obtaining history from patient or surrogate, ordering and performing treatments and interventions, ordering and review of laboratory studies, ordering and review of radiographic studies, pulse oximetry and re-evaluation of patient's condition.  Patient with severe hyponatremia, sodium less than 125. Differential diagnoses broad, gluten clinical history I suspect is hypovolemic hyponatremia but additional testing will be required her to the hospital service. The patient was given 1 L normal saline, which completed prior to realizing sodium 120. At this time holding further IV fluids in the ER. I will recheck a basic metabolic panel now and the patient will be admitted to the hospitalist. ____________________________________________   INITIAL IMPRESSION / ASSESSMENT AND PLAN / ED COURSE  Pertinent labs & imaging results that were available during my care of the patient were reviewed by me and considered in my medical decision making (see chart for details).         ____________________________________________   FINAL CLINICAL IMPRESSION(S) / ED DIAGNOSES  Final diagnoses:  Acute hyponatremia  Diarrhea, unspecified type      NEW MEDICATIONS STARTED DURING THIS VISIT:  New Prescriptions   No medications on file     Note:  This document was prepared using Dragon voice recognition  software and may include unintentional dictation errors.     Delman Kitten, MD 02/04/16 [phone]

## 2016-02-04 NOTE — Progress Notes (Signed)
[facility] NAME: Amber Welch    MR#:  [id number]  DATE OF BIRTH:  April 27, 1934  SUBJECTIVE:   Patient is here due to nausea vomiting and diarrhea and noted to be severely hyponatremic.  Continues to have persistent nausea but no further diarrhea. Still complaining of a cough which is currently nonproductive.  REVIEW OF SYSTEMS:    Review of Systems  Constitutional: Negative for chills and fever.  HENT: Negative for congestion and tinnitus.   Eyes: Negative for blurred vision and double vision.  Respiratory: Positive for cough. Negative for shortness of breath and wheezing.   Cardiovascular: Negative for chest pain, orthopnea and PND.  Gastrointestinal: Positive for nausea. Negative for abdominal pain, diarrhea and vomiting.  Genitourinary: Negative for dysuria and hematuria.  Neurological: Negative for dizziness, sensory change and focal weakness.  All other systems reviewed and are negative.   Nutrition: Carb modified Tolerating Diet: Yes Tolerating PT: Await Eval.    DRUG ALLERGIES:   Allergies  Allergen Reactions  . Penicillins Swelling, Rash and Other (See Comments)    [name], facial/tongue/throat swelling, [name], then may proceed with Cephalosporin use.   . Ace Inhibitors Other (See Comments)    Wasn't sleeping well  . Celebrex [Celecoxib] Cough  . Statins Other (See Comments)    Joint pain  . Iodinated Diagnostic Agents Swelling and Rash    Swelling of the ankles  . Norpramin [Desipramine] Rash  . Phenobarbital Swelling and Rash    Swelling of the  ankles  . Sinequan [Doxepin] Rash    VITALS:  Blood pressure (!) 152/72, pulse 84, temperature 98.2 F (36.8 C), temperature source Oral, resp. rate 16, height 5\' 6"  (1.676 m), weight 102.1 kg (225 lb), SpO2 98 %.  PHYSICAL EXAMINATION:   Physical Exam  GENERAL:  82 y.o.-year-old patient lying in bed in no acute distress.  EYES: Pupils equal, round, reactive to light and accommodation. No scleral icterus. Extraocular muscles intact.  HEENT: Head atraumatic, normocephalic. Oropharynx and nasopharynx clear.  NECK:  Supple, no jugular venous distention. No thyroid enlargement, no tenderness.  LUNGS: Normal breath sounds bilaterally, no wheezing, rales, rhonchi. No use of accessory muscles of respiration.  CARDIOVASCULAR: S1, S2 normal. No murmurs, rubs, or gallops.  ABDOMEN: Soft, nontender, nondistended. Bowel sounds present. No organomegaly or mass.  EXTREMITIES: No cyanosis, clubbing or edema b/l.    NEUROLOGIC: Cranial nerves II through XII are intact. No focal Motor or sensory deficits b/l. Globally weak.   PSYCHIATRIC: The patient is alert and oriented x 3.  SKIN: No obvious rash, lesion, or ulcer.    LABORATORY PANEL:   CBC  Recent Labs Lab 02/04/16 0602  WBC 8.5  HGB 12.2  HCT 34.9*  PLT 285   ------------------------------------------------------------------------------------------------------------------  Chemistries   Recent Labs Lab 02/03/16 2109  02/04/16 0941  NA 120*  < > 121*  K 4.3  < > 3.3*  CL 89*  < > 89*  CO2 23  < > 23  GLUCOSE 173*  < > 149*  BUN 18  < > 14  CREATININE 0.66  < >  0.57  CALCIUM 8.8*  < > 8.0*  AST 24  --   --   ALT 21  --   --   ALKPHOS 77  --   --   BILITOT 0.5  --   --   < > = values in this interval not displayed. ------------------------------------------------------------------------------------------------------------------  Cardiac Enzymes  Recent Labs Lab 02/03/16 2109  TROPONINI 0.03*    ------------------------------------------------------------------------------------------------------------------  RADIOLOGY:  Ct Abdomen Pelvis Wo Contrast  Result Date: 02/04/2016 CLINICAL DATA:  Nausea, and diarrhea.  Lower abdominal discomfort. EXAM: CT ABDOMEN AND PELVIS WITHOUT CONTRAST TECHNIQUE: Multidetector CT imaging of the abdomen and pelvis was performed following the standard protocol without IV contrast. COMPARISON:  CTA 10/06/2010 FINDINGS: Lower chest: Minimal smooth septal thickening in the lung bases suggesting pulmonary edema. Heart is normal in size. There are mitral annulus calcifications. Hepatobiliary: No focal lesion allowing for lack contrast. Possible tiny gallstone within physiologically distended gallbladder. No biliary dilatation. Pancreas: No ductal dilatation or inflammation. Spleen: Normal in size without focal abnormality. Adrenals/Urinary Tract: No adrenal nodule. No hydronephrosis. 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 Mild nonspecific perinephric stranding. Urinary bladder is physiologically distended. No bladder wall thickening. Stomach/Bowel: Small hiatal hernia. Enteric contrast of distal esophagus can be seen with reflux or slow transit. Stomach is physiologically distended with ingested contrast. No evidence of bowel wall thickening, distention or inflammation. Normal appendix. Minimal sigmoid colonic diverticulosis without inflammation. No colonic wall thickening. Vascular/Lymphatic: Aortic atherosclerosis. No enlarged abdominal or pelvic lymph nodes. Reproductive: Uterus and bilateral adnexa are unremarkable. Questionable phlebolith versus right ovarian calcification is unchanged. Other: No free air, free fluid, or intra-abdominal fluid collection. Tiny fat containing umbilical hernia. Musculoskeletal: Degenerative anterolisthesis of L4 on L5. Multilevel degenerative change throughout the lumbar spine. There  are no acute or suspicious osseous abnormalities. IMPRESSION: 1. No explanation for diarrhea or lower abdominal discomfort. Minimal colonic diverticulosis without acute diverticulitis or colonic inflammation. 2. Small hiatal hernia containing enteric contrast, can be seen with reflux or delayed transit. 3. Possible tiny gallstone. 4. Suspect mild pulmonary edema at the lung bases. 5. Aortic atherosclerosis without aneurysm. Electronically Signed   By: Jeb Levering M.D.   On: 02/04/2016 02:26   Dg Chest 2 View  Result Date: 02/03/2016 CLINICAL DATA:  Cough for 3 weeks.  Weakness. EXAM: CHEST  2 VIEW COMPARISON:  Report from chest radiograph 11/20/2012, no images available. FINDINGS: Lungs are hyperinflated. Mild bronchial thickening. No focal airspace disease. No pulmonary edema, pleural fluid or pneumothorax. Heart size is normal, mild atherosclerosis of the thoracic aorta. Remote right rib fractures. Degenerative change of the left greater than right shoulder. IMPRESSION: 1. Hyperinflation and mild bronchial thickening, suggesting emphysema/COPD. 2. No acute abnormality. 3. Aortic atherosclerosis. Electronically Signed   By: Jeb Levering M.D.   On: 02/03/2016 21:37   Ct Head Wo Contrast  Result Date: 02/04/2016 CLINICAL DATA:  Hypertension and weakness.  Nausea. EXAM: CT HEAD WITHOUT CONTRAST TECHNIQUE: Contiguous axial images were obtained from the base of the skull through the vertex without intravenous contrast. COMPARISON:  None. FINDINGS: Brain: There is no intracranial hemorrhage, mass or evidence of acute infarction. There is mild generalized atrophy. There is mild chronic microvascular ischemic change. There is no significant extra-axial fluid collection. No acute intracranial findings are evident. Vascular: No hyperdense vessel or unexpected calcification. Skull: Normal. Negative for fracture or focal lesion. Sinuses/Orbits: No acute finding. Other: None. IMPRESSION: No acute intracranial  findings. There is mild generalized atrophy and chronic appearing white matter hypodensities which likely represent small vessel ischemic disease. Electronically Signed   By: Andreas Newport M.D.   On: 02/04/2016 02:19     ASSESSMENT AND PLAN:   82 year old female with past medical history of essential hypertension, obstructive sleep apnea, diabetes, depression, COPD, anxiety who presented to the hospital due to nausea vomiting diarrhea and noted to be hyponatremic.  1. Hyponatremia-hypovolemic in nature, noted to nausea vomiting and diarrhea. -Continue gentle IV fluid hydration, hold diuretics. Follow sodium.  2. Nausea vomiting diarrhea-secondary to a viral illness. Patient influenza PCR is negative. -Continue supportive care with IV fluids, antiemetics.  Patient had a CT of the abdomen and pelvis done on admission showing no acute pathology.  3. Anxiety-continue Xanax.  4. Accelerated hypertension-improved since admission. Continue carvedilol, as needed IV hydralazine.  5. GERD-continue Protonix.  6. Neuropathy-continue gabapentin.   All the records are reviewed and case discussed with Care Management/Social Worker. Management plans discussed with the patient, family and they are in agreement.  CODE STATUS: Full  DVT Prophylaxis: Lovenox  TOTAL TIME TAKING CARE OF THIS PATIENT: 30 minutes.   POSSIBLE D/C IN 1-2 DAYS, DEPENDING ON CLINICAL CONDITION.   Henreitta Leber M.D on 02/04/2016 at 1:13 PM  Between 7am to 6pm - Pager - [phone]  After 6pm go to [email or web address] - Proofreader  [facility]  Office  [phone]  CC: Primary care physician; Rusty Aus, MD

## 2016-02-04 NOTE — H&P (Signed)
[facility] NAME: Amber Welch    MR#:  [id number]  DATE OF BIRTH:  1934-11-01  DATE OF ADMISSION:  02/03/2016  PRIMARY CARE PHYSICIAN: Rusty Aus, MD   REQUESTING/REFERRING PHYSICIAN:   CHIEF COMPLAINT:   Chief Complaint  Patient presents with  . Emesis  . Diarrhea  . Cough    HISTORY OF PRESENT ILLNESS: Amber Welch  is a 82 y.o. female with a known history of Carotid artery disease, COPD, type 2 diabetes mellitus, hyperlipidemia, hypertension, obesity, arthritis presented to the emergency room with nausea, dry heaves and diarrhea. Patient has some occasional cough and she was prescribed antibiotic by primary care physician. After taking one dose she had watery diarrhea. The diarrhea was watery stool but no blood in the stool. She has mild abdominal discomfort. No vomiting but dry heaves noted. Patient has generalized weakness. Upon evaluation in the emergency room patient's sodium level was low. Her blood pressure was also elevated during the presentation in the emergency room. Flu test was negative. Patient was worked up with CT abdomen and CT head which showed no acute abnormality. Hospitalist service was consulted for further care of the patient. No history of any seizure, confusion.  PAST MEDICAL HISTORY:   Past Medical History:  Diagnosis Date  . Anxiety   . Arthritis    Back and knees  . Asthma    a. chronic SOB  . Carotid artery disease ([place])    a. carotid doppler 2016: [id number] RICA, [id number] LICA  . COPD (chronic obstructive pulmonary disease) ([place])   . Depression   . Diabetes type 2, controlled ([place])   . Hyperlipidemia   . Insomnia   . Labile hypertension    a. when she is stressed BP will increase to the 123456 systolic  . Medication intolerance   . Morbid obesity ([place])   . Normal coronary arteries    a. normal cath 2003; b. normal Myoview in 2008 & 2009  . OSA (obstructive sleep apnea)    a. BiPAP  . Physical  deconditioning     PAST SURGICAL HISTORY: Past Surgical History:  Procedure Laterality Date  . COLONOSCOPY WITH PROPOFOL N/A 07/31/2014   Procedure: COLONOSCOPY WITH PROPOFOL;  Surgeon: Hulen Luster, MD;  Location: [facility] ENDOSCOPY;  Service: Gastroenterology;  Laterality: N/A;  . DILATION AND CURETTAGE OF UTERUS    . FOOT SURGERY     Left foot, bunionectomy and hammer toe  . JOINT REPLACEMENT  06/05/11   Right knee replaced  . JOINT REPLACEMENT  09/20/11   Left knee  . right foot surgery Right April 2015   Right Foot and Ankle  . right knee surgery     arthroscopy for torn meniscus    SOCIAL HISTORY:  Social History  Substance Use Topics  . Smoking status: Never Smoker  . Smokeless tobacco: Never Used     Comment: tobacco use - no  . Alcohol use No    FAMILY HISTORY:  Family History  Problem Relation Age of Onset  . Heart disease Father     Heart Disease before age 61  . Diabetes Father   . Hyperlipidemia Father   . Heart attack Father   . Heart disease Mother   . Hypertension Mother   . Heart attack Mother     DRUG ALLERGIES:  Allergies  Allergen Reactions  . Penicillins Swelling, Rash and Other (See Comments)    Pt. Turn blue Has  patient had a PCN reaction causing immediate rash, facial/tongue/throat swelling, [name], then may proceed with Cephalosporin use.   . Ace Inhibitors Other (See Comments)    Wasn't sleeping well  . Celebrex [Celecoxib] Cough  . Statins Other (See Comments)    Joint pain  . Iodinated Diagnostic Agents Swelling and Rash    Swelling of the ankles  . Norpramin [Desipramine] Rash  . Phenobarbital Swelling and Rash    Swelling of the ankles   . Sinequan [Doxepin] Rash    REVIEW OF SYSTEMS:   CONSTITUTIONAL: No fever, has weakness.  EYES: No blurred or double vision.  EARS, NOSE, AND THROAT: No tinnitus or ear pain.  RESPIRATORY: Has cough, no shortness of breath, wheezing or hemoptysis.  CARDIOVASCULAR: No chest pain, orthopnea, edema.  GASTROINTESTINAL: Has nausea, no vomiting,  Has diarrhea, abdominal pain.  GENITOURINARY: No dysuria, hematuria.  ENDOCRINE: No polyuria, nocturia,  HEMATOLOGY: No anemia, easy bruising or bleeding SKIN: No rash or lesion. MUSCULOSKELETAL: Has arthritis.   NEUROLOGIC: No tingling, numbness, weakness.  PSYCHIATRY: No anxiety or depression.   MEDICATIONS AT HOME:  Prior to Admission medications   Medication Sig Start Date End Date Taking? Authorizing Provider  albuterol (VENTOLIN HFA) 108 (90 Base) MCG/ACT inhaler Inhale 2 puffs into the lungs every 4 (four) hours as needed for wheezing. UAD 01/08/15  Yes Flora Lipps, MD  ALPRAZolam Duanne Moron) 0.5 MG tablet Take 0.25-0.5 mg by mouth at bedtime as needed for sleep.   Yes Historical Provider, MD  aspirin 81 MG tablet Take 81 mg by mouth daily.   Yes Historical Provider, MD  Azelastine HCl (ASTEPRO NA) Place 1 spray into the nose at bedtime as needed (congestion).    Yes Historical Provider, MD  Azilsartan Medoxomil (EDARBI) 80 MG TABS Take 1 tablet by mouth daily.    Yes Historical Provider, MD  carvedilol (COREG) 12.5 MG tablet Take 1 tablet (12.5 mg total) by mouth 2 (two) times daily. 08/25/15 02/04/16 Yes Minna Merritts, MD  esomeprazole (NEXIUM) 40 MG capsule Take 40 mg by mouth daily before breakfast.     Yes Historical Provider, MD  gabapentin (NEURONTIN) 100 MG capsule Take 300 mg by mouth at bedtime.  07/01/11  Yes Historical Provider, MD  hydrALAZINE (APRESOLINE) 25 MG tablet Take 1 tablet (25 mg total) by mouth 4 (four) times daily. 08/03/15  Yes Minna Merritts, MD  LORazepam (ATIVAN) 1 MG tablet Take 1 mg by mouth at bedtime.   Yes  Historical Provider, MD  magnesium gluconate (MAGONATE) 500 MG tablet Take 500 mg by mouth daily.   Yes Historical Provider, MD  metFORMIN (GLUCOPHAGE) 500 MG tablet Take 500 mg by mouth daily.     Yes Historical Provider, MD  mometasone (NASONEX) 50 MCG/ACT nasal spray Place 2 sprays into the nose daily as needed (allergies).    Yes Historical Provider, MD  nitroGLYCERIN (NITROSTAT) 0.4 MG SL tablet Place 1 tablet (0.4 mg total) under the tongue every 5 (five) minutes as needed for chest pain. 03/10/15 03/09/16 Yes Minna Merritts, MD  triamterene-hydrochlorothiazide (MAXZIDE) 75-50 MG tablet Take 1 tablet by mouth daily. 07/16/15  Yes Ryan M Dunn, PA-C  ferrous sulfate 325 (  65 FE) MG tablet Take 325 mg by mouth 2 (two) times daily with a meal.    Historical Provider, MD  Melatonin 5 MG CAPS Take 1 capsule by mouth at bedtime as needed (sleep).     Historical Provider, MD      PHYSICAL EXAMINATION:   VITAL SIGNS: Blood pressure (!) 180/137, pulse 80, temperature 98.2 F (36.8 C), temperature source Oral, resp. rate 19, height 5\' 6"  (1.676 m), weight 102.1 kg (225 lb), SpO2 98 %.  GENERAL:  82 y.o.-year-old patient lying in the bed with no acute distress.  EYES: Pupils equal, round, reactive to light and accommodation. No scleral icterus. Extraocular muscles intact.  HEENT: Head atraumatic, normocephalic. Oropharynx dry and nasopharynx clear.  NECK:  Supple, no jugular venous distention. No thyroid enlargement, no tenderness.  LUNGS: Normal breath sounds bilaterally, no wheezing, rales,rhonchi or crepitation. No use of accessory muscles of respiration.  CARDIOVASCULAR: S1, S2 normal. No murmurs, rubs, or gallops.  ABDOMEN: Soft, nontender, nondistended. Bowel sounds present. No organomegaly or mass.  EXTREMITIES: No pedal edema, cyanosis, or clubbing.  NEUROLOGIC: Cranial nerves II through XII are intact. Muscle strength 5/5 in all extremities. Sensation intact. Gait not checked.  PSYCHIATRIC:  The patient is alert and oriented x 3.  SKIN: No obvious rash, lesion, or ulcer.   LABORATORY PANEL:   CBC  Recent Labs Lab 02/03/16 2109  WBC 7.1  HGB 12.5  HCT 35.4  PLT 297  MCV 84.8  MCH 29.8  MCHC 35.2  RDW 13.6   ------------------------------------------------------------------------------------------------------------------  Chemistries   Recent Labs Lab 02/03/16 2109  NA 120*  K 4.3  CL 89*  CO2 23  GLUCOSE 173*  BUN 18  CREATININE 0.66  CALCIUM 8.8*  AST 24  ALT 21  ALKPHOS 77  BILITOT 0.5   ------------------------------------------------------------------------------------------------------------------ estimated creatinine clearance is 68.8 mL/min (by C-G formula based on SCr of 0.66 mg/dL). ------------------------------------------------------------------------------------------------------------------ No results for input(s): TSH, T4TOTAL, T3FREE, THYROIDAB in the last 72 hours.  Invalid input(s): FREET3   Coagulation profile No results for input(s): INR, PROTIME in the last 168 hours. ------------------------------------------------------------------------------------------------------------------- No results for input(s): DDIMER in the last 72 hours. -------------------------------------------------------------------------------------------------------------------  Cardiac Enzymes  Recent Labs Lab 02/03/16 2109  TROPONINI 0.03*   ------------------------------------------------------------------------------------------------------------------ Invalid input(s): POCBNP  ---------------------------------------------------------------------------------------------------------------  Urinalysis    Component Value Date/Time   COLORURINE YELLOW (A) 02/04/2016 0057   APPEARANCEUR CLEAR (A) 02/04/2016 0057   APPEARANCEUR Clear 05/22/2011 1011   LABSPEC 1.013 02/04/2016 0057   LABSPEC 1.009 05/22/2011 1011   PHURINE 6.0 02/04/2016 0057    GLUCOSEU 50 (A) 02/04/2016 0057   GLUCOSEU Negative 05/22/2011 1011   HGBUR SMALL (A) 02/04/2016 0057   BILIRUBINUR NEGATIVE 02/04/2016 0057   BILIRUBINUR Negative 05/22/2011 1011   KETONESUR 20 (A) 02/04/2016 0057   PROTEINUR 30 (A) 02/04/2016 0057   NITRITE NEGATIVE 02/04/2016 0057   LEUKOCYTESUR NEGATIVE 02/04/2016 0057   LEUKOCYTESUR Negative 05/22/2011 1011     RADIOLOGY: Ct Abdomen Pelvis Wo Contrast  Result Date: 02/04/2016 CLINICAL DATA:  Nausea, and diarrhea.  Lower abdominal discomfort. EXAM: CT ABDOMEN AND PELVIS WITHOUT CONTRAST TECHNIQUE: Multidetector CT imaging of the abdomen and pelvis was performed following the standard protocol without IV contrast. COMPARISON:  CTA 10/06/2010 FINDINGS: Lower chest: Minimal smooth septal thickening in the lung bases suggesting pulmonary edema. Heart is normal in size. There are mitral annulus calcifications. Hepatobiliary: No focal lesion allowing for lack contrast. Possible tiny gallstone within physiologically distended gallbladder. No biliary dilatation. Pancreas:  No ductal dilatation or inflammation. Spleen: Normal in size without focal abnormality. Adrenals/Urinary Tract: No adrenal nodule. No hydronephrosis. 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 Mild nonspecific perinephric stranding. Urinary bladder is physiologically distended. No bladder wall thickening. Stomach/Bowel: Small hiatal hernia. Enteric contrast of distal esophagus can be seen with reflux or slow transit. Stomach is physiologically distended with ingested contrast. No evidence of bowel wall thickening, distention or inflammation. Normal appendix. Minimal sigmoid colonic diverticulosis without inflammation. No colonic wall thickening. Vascular/Lymphatic: Aortic atherosclerosis. No enlarged abdominal or pelvic lymph nodes. Reproductive: Uterus and bilateral adnexa are unremarkable. Questionable phlebolith versus right ovarian  calcification is unchanged. Other: No free air, free fluid, or intra-abdominal fluid collection. Tiny fat containing umbilical hernia. Musculoskeletal: Degenerative anterolisthesis of L4 on L5. Multilevel degenerative change throughout the lumbar spine. There are no acute or suspicious osseous abnormalities. IMPRESSION: 1. No explanation for diarrhea or lower abdominal discomfort. Minimal colonic diverticulosis without acute diverticulitis or colonic inflammation. 2. Small hiatal hernia containing enteric contrast, can be seen with reflux or delayed transit. 3. Possible tiny gallstone. 4. Suspect mild pulmonary edema at the lung bases. 5. Aortic atherosclerosis without aneurysm. Electronically Signed   By: Jeb Levering M.D.   On: 02/04/2016 02:26   Dg Chest 2 View  Result Date: 02/03/2016 CLINICAL DATA:  Cough for 3 weeks.  Weakness. EXAM: CHEST  2 VIEW COMPARISON:  Report from chest radiograph 11/20/2012, no images available. FINDINGS: Lungs are hyperinflated. Mild bronchial thickening. No focal airspace disease. No pulmonary edema, pleural fluid or pneumothorax. Heart size is normal, mild atherosclerosis of the thoracic aorta. Remote right rib fractures. Degenerative change of the left greater than right shoulder. IMPRESSION: 1. Hyperinflation and mild bronchial thickening, suggesting emphysema/COPD. 2. No acute abnormality. 3. Aortic atherosclerosis. Electronically Signed   By: Jeb Levering M.D.   On: 02/03/2016 21:37   Ct Head Wo Contrast  Result Date: 02/04/2016 CLINICAL DATA:  Hypertension and weakness.  Nausea. EXAM: CT HEAD WITHOUT CONTRAST TECHNIQUE: Contiguous axial images were obtained from the base of the skull through the vertex without intravenous contrast. COMPARISON:  None. FINDINGS: Brain: There is no intracranial hemorrhage, mass or evidence of acute infarction. There is mild generalized atrophy. There is mild chronic microvascular ischemic change. There is no significant extra-axial  fluid collection. No acute intracranial findings are evident. Vascular: No hyperdense vessel or unexpected calcification. Skull: Normal. Negative for fracture or focal lesion. Sinuses/Orbits: No acute finding. Other: None. IMPRESSION: No acute intracranial findings. There is mild generalized atrophy and chronic appearing white matter hypodensities which likely represent small vessel ischemic disease. Electronically Signed   By: Andreas Newport M.D.   On: 02/04/2016 02:19    EKG: Orders placed or performed during the hospital encounter of 02/03/16  . ED EKG within 10 minutes  . ED EKG within 10 minutes    IMPRESSION AND PLAN: 82 year old female patient with history of COPD, type 2 diabetes mellitus, carotid artery disease, hypertension, hyperlipidemia presented to the emergency room with nausea, dry heaves and diarrhea. Admitting diagnosis 1. Hyponatremia 2. Diarrhea 3. Dehydration 4. Uncontrolled hypertension 5. Diabetes mellitus type 2 Treatment plan Admit patient to medical floor IV fluid hydration Follow up sodium closely Control blood pressure with oral beta blocker and hydralazine Monitor electrolytes Sliding scale coverage for diabetes mellitus Supportive care  All the records are reviewed and case discussed with ED provider. Management plans discussed with the patient, family and they  are in agreement.  CODE STATUS:FULL CODE Surrogate decision maker : Husband Code Status History    This patient does not have a recorded code status. Please follow your organizational policy for patients in this situation.    Advance Directive Documentation   [place] Most Recent Value  Type of Advance Directive  Living will  Pre-existing out of facility DNR order (yellow form or pink MOST form)  No data  "MOST" Form in Place?  No data       TOTAL TIME TAKING CARE OF THIS PATIENT: 52 minutes.    Saundra Shelling M.D on 02/04/2016 at 3:59 AM  Between 7am to 6pm - Pager -  [phone]  After 6pm go to [email or web address] - password [email or web address] [facility]  Office  [phone]  CC: Primary care physician; Rusty Aus, MD

## 2016-02-05 LAB — BASIC METABOLIC PANEL
Anion gap: 6 (ref 5–15)
BUN: 14 mg/dL (ref 6–20)
CALCIUM: 8.1 mg/dL — AB (ref 8.9–10.3)
CO2: 25 mmol/L (ref 22–32)
CREATININE: 0.65 mg/dL (ref 0.44–1.00)
Chloride: 91 mmol/L — ABNORMAL LOW (ref 101–111)
GFR calc non Af Amer: >60 mL/min (ref >60)
Glucose, Bld: 102 mg/dL — ABNORMAL HIGH (ref 65–99)
Potassium: 3.1 mmol/L — ABNORMAL LOW (ref 3.5–5.1)
SODIUM: 122 mmol/L — AB (ref 135–145)

## 2016-02-05 LAB — GLUCOSE, CAPILLARY
GLUCOSE-CAPILLARY: 110 mg/dL — AB (ref 65–99)
GLUCOSE-CAPILLARY: 95 mg/dL (ref 65–99)
GLUCOSE-CAPILLARY: 119 mg/dL — AB (ref 65–99)

## 2016-02-05 MED ORDER — POTASSIUM CHLORIDE CRYS ER 20 MEQ PO TBCR
40.0000 meq | EXTENDED_RELEASE_TABLET | ORAL | Status: DC | PRN
Start: 2016-02-05 — End: 2016-02-07
  Administered 2016-02-07: 40 meq via ORAL
  Filled 2016-02-05: qty 2

## 2016-02-05 MED ORDER — INSULIN ASPART 100 UNIT/ML ~~LOC~~ SOLN
0.0000 [IU] | Freq: Three times a day (TID) | SUBCUTANEOUS | Status: DC
  Administered 2016-02-06: 2 [IU] via SUBCUTANEOUS
  Filled 2016-02-05: qty 1

## 2016-02-05 NOTE — Progress Notes (Signed)
Norovirus PCR still pending results. Continue enteric precautions.

## 2016-02-05 NOTE — Progress Notes (Signed)
Pt requests Rx for glucometer & strips at discharge, stated she has one at home but is broken so she is unable to check her BG at home.

## 2016-02-05 NOTE — Progress Notes (Signed)
[place] [facility] [place] NAME: Amber Welch    MR#:  [id number]  DATE OF BIRTH:  06-06-1934  SUBJECTIVE:   Patient is here due to nausea vomiting and diarrhea and noted to be severely hyponatremic.    No further nausea ,vomiting or diarrhea. Sodium improved little  but patient IV access is infiltrated so we are getting a new IV access to restart the IV hydration.  REVIEW OF SYSTEMS:    Review of Systems  Constitutional: Negative for chills and fever.  HENT: Negative for congestion and tinnitus.   Eyes: Negative for blurred vision and double vision.  Respiratory: Positive for cough. Negative for shortness of breath and wheezing.   Cardiovascular: Negative for chest pain, orthopnea and PND.  Gastrointestinal: Negative for abdominal pain, diarrhea, nausea and vomiting.  Genitourinary: Negative for dysuria and hematuria.  Neurological: Negative for dizziness, sensory change and focal weakness.  All other systems reviewed and are negative.   Nutrition: Carb modified Tolerating Diet: Yes Tolerating PT: Await Eval.    DRUG ALLERGIES:   Allergies  Allergen Reactions  . Penicillins Swelling, Rash and Other (See Comments)    [name], facial/tongue/throat swelling, [name], then may proceed with Cephalosporin use.   . Ace Inhibitors Other (See Comments)    Wasn't sleeping well  . Celebrex [Celecoxib] Cough  . Statins Other (See Comments)    Joint pain  . Iodinated Diagnostic Agents Swelling and Rash    Swelling of the ankles  . Norpramin [Desipramine] Rash  . Phenobarbital Swelling  and Rash    Swelling of the ankles  . Sinequan [Doxepin] Rash    VITALS:  Blood pressure (!) 161/78, pulse 65, temperature 98.2 F (36.8 C), temperature source Oral, resp. rate 18, height 5\' 6"  (1.676 m), weight 102.1 kg (225 lb), SpO2 97 %.  PHYSICAL EXAMINATION:   Physical Exam  GENERAL:  81 y.o.-year-old patient lying in bed in no acute distress.  EYES: Pupils equal, round, reactive to light and accommodation. No scleral icterus. Extraocular muscles intact.  HEENT: Head atraumatic, normocephalic. Oropharynx and nasopharynx clear.  NECK:  Supple, no jugular venous distention. No thyroid enlargement, no tenderness.  LUNGS: Normal breath sounds bilaterally, no wheezing, rales, rhonchi. No use of accessory muscles of respiration.  CARDIOVASCULAR: S1, S2 normal. No murmurs, rubs, or gallops.  ABDOMEN: Soft, nontender, nondistended. Bowel sounds present. No organomegaly or mass.  EXTREMITIES: No cyanosis, clubbing /Has trace pitted edema.Marland Kitchen    NEUROLOGIC: Cranial nerves II through XII are intact. No focal Motor or sensory deficits b/l. Globally weak.   PSYCHIATRIC: The patient is alert and oriented x 3.  SKIN: No obvious rash, lesion, or ulcer.    LABORATORY PANEL:   CBC  Recent Labs Lab 02/04/16 0602  WBC 8.5  HGB 12.2  HCT 34.9*  PLT 285   ------------------------------------------------------------------------------------------------------------------  Chemistries   Recent Labs Lab 02/03/16 2109  02/05/16 0420  NA 120*  < > 122*  K 4.3  < > 3.1*  CL 89*  < > 91*  CO2 23  < > 25  GLUCOSE 173*  < >  102*  BUN 18  < > 14  CREATININE 0.66  < > 0.65  CALCIUM 8.8*  < > 8.1*  AST 24  --   --   ALT 21  --   --   ALKPHOS 77  --   --   BILITOT 0.5  --   --   < > = values in this interval not displayed. ------------------------------------------------------------------------------------------------------------------  Cardiac Enzymes  Recent Labs Lab 02/03/16 2109   TROPONINI 0.03*   ------------------------------------------------------------------------------------------------------------------  RADIOLOGY:  Ct Abdomen Pelvis Wo Contrast  Result Date: 02/04/2016 CLINICAL DATA:  Nausea, and diarrhea.  Lower abdominal discomfort. EXAM: CT ABDOMEN AND PELVIS WITHOUT CONTRAST TECHNIQUE: Multidetector CT imaging of the abdomen and pelvis was performed following the standard protocol without IV contrast. COMPARISON:  CTA 10/06/2010 FINDINGS: Lower chest: Minimal smooth septal thickening in the lung bases suggesting pulmonary edema. Heart is normal in size. There are mitral annulus calcifications. Hepatobiliary: No focal lesion allowing for lack contrast. Possible tiny gallstone within physiologically distended gallbladder. No biliary dilatation. Pancreas: No ductal dilatation or inflammation. Spleen: Normal in size without focal abnormality. Adrenals/Urinary Tract: No adrenal nodule. No hydronephrosis. 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 Mild nonspecific perinephric stranding. Urinary bladder is physiologically distended. No bladder wall thickening. Stomach/Bowel: Small hiatal hernia. Enteric contrast of distal esophagus can be seen with reflux or slow transit. Stomach is physiologically distended with ingested contrast. No evidence of bowel wall thickening, distention or inflammation. Normal appendix. Minimal sigmoid colonic diverticulosis without inflammation. No colonic wall thickening. Vascular/Lymphatic: Aortic atherosclerosis. No enlarged abdominal or pelvic lymph nodes. Reproductive: Uterus and bilateral adnexa are unremarkable. Questionable phlebolith versus right ovarian calcification is unchanged. Other: No free air, free fluid, or intra-abdominal fluid collection. Tiny fat containing umbilical hernia. Musculoskeletal: Degenerative anterolisthesis of L4 on L5. Multilevel degenerative change throughout the  lumbar spine. There are no acute or suspicious osseous abnormalities. IMPRESSION: 1. No explanation for diarrhea or lower abdominal discomfort. Minimal colonic diverticulosis without acute diverticulitis or colonic inflammation. 2. Small hiatal hernia containing enteric contrast, can be seen with reflux or delayed transit. 3. Possible tiny gallstone. 4. Suspect mild pulmonary edema at the lung bases. 5. Aortic atherosclerosis without aneurysm. Electronically Signed   By: Jeb Levering M.D.   On: 02/04/2016 02:26   Dg Chest 2 View  Result Date: 02/03/2016 CLINICAL DATA:  Cough for 3 weeks.  Weakness. EXAM: CHEST  2 VIEW COMPARISON:  Report from chest radiograph 11/20/2012, no images available. FINDINGS: Lungs are hyperinflated. Mild bronchial thickening. No focal airspace disease. No pulmonary edema, pleural fluid or pneumothorax. Heart size is normal, mild atherosclerosis of the thoracic aorta. Remote right rib fractures. Degenerative change of the left greater than right shoulder. IMPRESSION: 1. Hyperinflation and mild bronchial thickening, suggesting emphysema/COPD. 2. No acute abnormality. 3. Aortic atherosclerosis. Electronically Signed   By: Jeb Levering M.D.   On: 02/03/2016 21:37   Ct Head Wo Contrast  Result Date: 02/04/2016 CLINICAL DATA:  Hypertension and weakness.  Nausea. EXAM: CT HEAD WITHOUT CONTRAST TECHNIQUE: Contiguous axial images were obtained from the base of the skull through the vertex without intravenous contrast. COMPARISON:  None. FINDINGS: Brain: There is no intracranial hemorrhage, mass or evidence of acute infarction. There is mild generalized atrophy. There is mild chronic microvascular ischemic change. There is no significant extra-axial fluid collection. No acute intracranial findings are evident. Vascular: No hyperdense vessel or unexpected calcification. Skull: Normal. Negative for  fracture or focal lesion. Sinuses/Orbits: No acute finding. Other: None. IMPRESSION: No  acute intracranial findings. There is mild generalized atrophy and chronic appearing white matter hypodensities which likely represent small vessel ischemic disease. Electronically Signed   By: Andreas Newport M.D.   On: 02/04/2016 02:19     ASSESSMENT AND PLAN:   82 year old female with past medical history of essential hypertension, obstructive sleep apnea, diabetes, depression, COPD, anxiety who presented to the hospital due to nausea vomiting diarrhea and noted to be hyponatremic.  1. Hyponatremia-hypovolemic in nature, noted to nausea vomiting and diarrhea. -Continue gentle IV fluid hydration, hold diuretics. Follow sodium. Nausea, vomiting improved. Stool PCR sent today continue enteric precautions, if negative for not rotavirus and other GI  Virus or  Bacteria. discontinue isolation  2. Nausea vomiting diarrhea-secondary to a viral illness. Patient influenza PCR is negative. -Continue supportive care with IV fluids, antiemetics.  Patient had a CT of the abdomen and pelvis done on admission showing no acute pathology.  3. Anxiety-continue Xanax.  4. Accelerated hypertension-improved since admission. Continue carvedilol, as needed IV hydralazine.d/c triamterene hctz  Due to severe hyponatremia.  5. GERD-continue Protonix.  6. Neuropathy-continue gabapentin. 7/hypokalemia:replace K; 8/d/w daughter  All the records are reviewed and case discussed with Care Management/Social Worker. Management plans discussed with the patient, family and they are in agreement.  CODE STATUS: Full  DVT Prophylaxis: Lovenox  TOTAL TIME TAKING CARE OF THIS PATIENT: 30 minutes.   POSSIBLE D/C IN 1-2 DAYS, DEPENDING ON CLINICAL CONDITION.   Epifanio Lesches M.D on 02/05/2016 at 9:32 AM  Between 7am to 6pm - Pager - [phone]  After 6pm go to [email or web address] - Proofreader  [facility]  Office  [phone]  CC: Primary care physician; Rusty Aus,  MD

## 2016-02-06 LAB — GLUCOSE, CAPILLARY
Glucose-Capillary: 89 mg/dL (ref 65–99)
Glucose-Capillary: 108 mg/dL — ABNORMAL HIGH (ref 65–99)
Glucose-Capillary: 167 mg/dL — ABNORMAL HIGH (ref 65–99)
Glucose-Capillary: 86 mg/dL (ref 65–99)

## 2016-02-06 LAB — BASIC METABOLIC PANEL
Anion gap: 6 (ref 5–15)
BUN: 14 mg/dL (ref 6–20)
CHLORIDE: 98 mmol/L — AB (ref 101–111)
CO2: 25 mmol/L (ref 22–32)
CREATININE: 0.69 mg/dL (ref 0.44–1.00)
Calcium: 8.2 mg/dL — ABNORMAL LOW (ref 8.9–10.3)
GFR calc Af Amer: >60 mL/min (ref >60)
GFR calc non Af Amer: >60 mL/min (ref >60)
GLUCOSE: 92 mg/dL (ref 65–99)
Potassium: 3.2 mmol/L — ABNORMAL LOW (ref 3.5–5.1)
Sodium: 129 mmol/L — ABNORMAL LOW (ref 135–145)

## 2016-02-06 MED ORDER — POTASSIUM CHLORIDE CRYS ER 20 MEQ PO TBCR
40.0000 meq | EXTENDED_RELEASE_TABLET | ORAL | Status: AC
  Administered 2016-02-06 (×2): 40 meq via ORAL
  Filled 2016-02-06 (×2): qty 2

## 2016-02-06 NOTE — Progress Notes (Signed)
[place] at [place] NAME: Amber Welch    MR#:  [id number]  DATE OF BIRTH:  April 30, 1934  SUBJECTIVE:   Patient is here due to nausea vomiting and diarrhea and noted to be severely hyponatremic.    Clinically improving. Sodium is improving. No further nausea or vomiting.  REVIEW OF SYSTEMS:    Review of Systems  Constitutional: Negative for chills and fever.  HENT: Negative for congestion and tinnitus.   Eyes: Negative for blurred vision and double vision.  Respiratory: Negative for cough, shortness of breath and wheezing.   Cardiovascular: Negative for chest pain, orthopnea and PND.  Gastrointestinal: Negative for abdominal pain, diarrhea, nausea and vomiting.  Genitourinary: Negative for dysuria and hematuria.  Neurological: Negative for dizziness, sensory change and focal weakness.  All other systems reviewed and are negative.   Nutrition: Carb modified Tolerating Diet: Yes Tolerating PT: Await Eval.    DRUG ALLERGIES:   Allergies  Allergen Reactions  . Penicillins Swelling, Rash and Other (See Comments)    [name], facial/tongue/throat swelling, [name], then may proceed with Cephalosporin use.   . Ace Inhibitors Other (See Comments)    Wasn't sleeping well  . Celebrex [Celecoxib] Cough  . Statins Other (See Comments)    Joint pain  . Iodinated Diagnostic Agents Swelling and Rash    Swelling of the ankles  . Norpramin [Desipramine] Rash  . Phenobarbital Swelling and Rash    Swelling of the ankles  . Sinequan [Doxepin] Rash    VITALS:  Blood pressure (!)  158/78, pulse 66, temperature 98.4 F (36.9 C), temperature source Oral, resp. rate 18, height 5\' 6"  (1.676 m), weight 102.1 kg (225 lb), SpO2 97 %.  PHYSICAL EXAMINATION:   Physical Exam  GENERAL:  82 y.o.-year-old patient lying in bed in no acute distress.  EYES: Pupils equal, round, reactive to light and accommodation. No scleral icterus. Extraocular muscles intact.  HEENT: Head atraumatic, normocephalic. Oropharynx and nasopharynx clear.  NECK:  Supple, no jugular venous distention. No thyroid enlargement, no tenderness.  LUNGS: Normal breath sounds bilaterally, no wheezing, rales, rhonchi. No use of accessory muscles of respiration.  CARDIOVASCULAR: S1, S2 normal. No murmurs, rubs, or gallops.  ABDOMEN: Soft, nontender, nondistended. Bowel sounds present. No organomegaly or mass.  EXTREMITIES: No cyanosis, clubbing /Has trace pitted edema.Marland Kitchen    NEUROLOGIC: Cranial nerves II through XII are intact. No focal Motor or sensory deficits b/l. Globally weak.   PSYCHIATRIC: The patient is alert and oriented x 3.  SKIN: No obvious rash, lesion, or ulcer.    LABORATORY PANEL:   CBC  Recent Labs Lab 02/04/16 0602  WBC 8.5  HGB 12.2  HCT 34.9*  PLT 285   ------------------------------------------------------------------------------------------------------------------  Chemistries   Recent Labs Lab 02/03/16 2109  02/06/16 0612  NA 120*  < > 129*  K 4.3  < > 3.2*  CL 89*  < > 98*  CO2 23  < > 25  GLUCOSE 173*  < > 92  BUN 18  < > 14  CREATININE 0.66  < > 0.69  CALCIUM 8.8*  < >  8.2*  AST 24  --   --   ALT 21  --   --   ALKPHOS 77  --   --   BILITOT 0.5  --   --   < > = values in this interval not displayed. ------------------------------------------------------------------------------------------------------------------  Cardiac Enzymes  Recent Labs Lab 02/03/16 2109  TROPONINI 0.03*    ------------------------------------------------------------------------------------------------------------------  RADIOLOGY:  No results found.   ASSESSMENT AND PLAN:   82 year old female with past medical history of essential hypertension, obstructive sleep apnea, diabetes, depression, COPD, anxiety who presented to the hospital due to nausea vomiting diarrhea and noted to be hyponatremic.  1. Hyponatremia-hypovolemic in nature, noted to nausea vomiting and diarrhea. -Continue gentle IV fluid hydration, hold diuretics. Follow sodium. Nausea, vomiting improved.tolerating diet.  no Further diarrhea so unable to send stool samples. Discontinue isolation as patient has no further diarrhea.  2. Nausea vomiting diarrhea-secondary to a viral illness. Patient influenza PCR is negative.  -Continue supportive care with IV fluids, antiemetics.  Patient had a CT of the abdomen and pelvis done on admission showing no acute pathology.  3. Anxiety-continue Xanax.  4. Accelerated hypertension-improved since admission. Continue carvedilol, as needed IV hydralazine.d/c triamterene hctz  Due to severe hyponatremia. She  wants to continue hydralazine at discharge but discontinue triamterene with HCTZ.  5. GERD-continue Protonix.  6. Neuropathy-continue gabapentin.  7/hypokalemia:replace K; Discharge tomorrow morning.   8/d/w daughter  All the records are reviewed and case discussed with Care Management/Social Worker. Management plans discussed with the patient, family and they are in agreement.  CODE STATUS: Full  DVT Prophylaxis: Lovenox  TOTAL TIME TAKING CARE OF THIS PATIENT: 30 minutes.   POSSIBLE D/C IN 1-2 DAYS, DEPENDING ON CLINICAL CONDITION.   Epifanio Lesches M.D on 02/06/2016 at 12:23 PM  Between 7am to 6pm - Pager - [phone]  After 6pm go to [email or web address] - Proofreader  [facility]  Office  [phone]  CC: Primary care  physician; Rusty Aus, MD

## 2016-02-07 LAB — GLUCOSE, CAPILLARY
GLUCOSE-CAPILLARY: 96 mg/dL (ref 65–99)
Glucose-Capillary: 114 mg/dL — ABNORMAL HIGH (ref 65–99)

## 2016-02-07 LAB — BASIC METABOLIC PANEL
ANION GAP: 6 (ref 5–15)
BUN: 17 mg/dL (ref 6–20)
CO2: 23 mmol/L (ref 22–32)
Calcium: 8.4 mg/dL — ABNORMAL LOW (ref 8.9–10.3)
Chloride: 103 mmol/L (ref 101–111)
Creatinine, Ser: 0.77 mg/dL (ref 0.44–1.00)
GFR calc Af Amer: >60 mL/min (ref >60)
GLUCOSE: 160 mg/dL — AB (ref 65–99)
POTASSIUM: 4.5 mmol/L (ref 3.5–5.1)
Sodium: 132 mmol/L — ABNORMAL LOW (ref 135–145)

## 2016-02-07 MED ORDER — HYDRALAZINE HCL 50 MG PO TABS: 50.0000 mg | ORAL_TABLET | Freq: Three times a day (TID) | ORAL | 0 refills | Status: DC

## 2016-02-07 NOTE — Progress Notes (Signed)
This Probation officer dc'd pt's iv site from L hand with catheter intact, pt tolerated well. Pt received script for glucometer and supplies, pt stated hers has been broken for a year at home. D/c instructions reviewed, pt verbalized understanding, signed and received instructions. Pt waiting for ride home.

## 2016-02-07 NOTE — Progress Notes (Signed)
Shift assessment completed. Pt is alert and oriented, stating she is sneezing and has allergy symptoms, received flonase early per her request. Pt is alert and oriented, on room air, lungs are clear, Hr is regular, abdomen is soft, bs hyperactive. PPP, socks leave a line on her ankles but no pitting edema ntoed. PIV #20 intact to l wrist with iv ns infusing at 37mls/hr, site free of redness and swelling. Pt verbalized that she felt better, hoping for discharge home. Since assesment, Dr. Vianne Bulls has been in on rounds, verbalized to pt that she will be dc'd pending stat labs.

## 2016-02-07 NOTE — Plan of Care (Signed)
Problem: Bowel/Gastric: Goal: Will not experience complications related to bowel motility Outcome: Completed/Met Date Met: 02/07/16 Pt has met all goals for discharge.   

## 2016-02-08 LAB — NOROVIRUS GROUP 1 & 2 BY PCR, STOOL
NOROVIRUS 1 BY PCR: NEGATIVE
Norovirus 2  by PCR: NEGATIVE

## 2016-02-09 NOTE — Discharge Summary (Signed)
Amber Welch, is a 82 y.o. female  DOB 24-Jun-1934  MRN [id number].  Admission date:  02/03/2016  Admitting Physician  Saundra Shelling, MD  Discharge Date:  02/07/2016   Primary MD  Rusty Aus, MD  Recommendations for primary care physician for things to follow:   Follow-up with primary doctor in 1 week   Admission Diagnosis  Acute hyponatremia [E87.1] Diarrhea, unspecified type [R19.7]   Discharge Diagnosis  Acute hyponatremia [E87.1] Diarrhea, unspecified type [R19.7]    Principal Problem:   Hyponatremia      Past Medical History:  Diagnosis Date  . Anxiety   . Arthritis    Back and knees  . Asthma    a. chronic SOB  . Carotid artery disease ([place])    a. carotid doppler 2016: [id number] RICA, [id number] LICA  . COPD (chronic obstructive pulmonary disease) ([place])   . Depression   . Diabetes type 2, controlled ([place])   . Hyperlipidemia   . Insomnia   . Labile hypertension    a. when she is stressed BP will increase to the [id number] systolic  . Medication intolerance   . Morbid obesity ([place])   . Normal coronary arteries    a. normal cath 2003; b. normal Myoview in 2008 & 2009  . OSA (obstructive sleep apnea)    a. BiPAP  . Physical deconditioning     Past Surgical History:  Procedure Laterality Date  . COLONOSCOPY WITH PROPOFOL N/A 07/31/2014   Procedure: COLONOSCOPY WITH PROPOFOL;  Surgeon: Hulen Luster, MD;  Location: [facility] ENDOSCOPY;  Service: Gastroenterology;  Laterality: N/A;  . DILATION AND CURETTAGE OF UTERUS    . FOOT SURGERY     Left foot, bunionectomy and hammer toe  . JOINT REPLACEMENT  06/05/11   Right knee replaced  . JOINT REPLACEMENT  09/20/11   Left knee  . right foot surgery Right April 2015   Right Foot and Ankle  . right knee surgery     arthroscopy for torn meniscus       History of present  illness and  Hospital Course:     Kindly see H&P for history of present illness and admission details, please review complete Labs, Consult reports and Test reports for all details in brief  HPI  from the history and physical done on the day of admission  Amber Welch  is a 82 y.o. female with a known history of Carotid artery disease, COPD, type 2 diabetes mellitus, hyperlipidemia, hypertension, obesity, arthritis presented to the emergency room with nausea, dry heaves and diarrhea. [place] Kitchen SEVERE hyponatremia. CT head, CT abdomen unremarkable.   Hospital Course  Severe hyponatremia secondary to volume depletion in the contest of GI illness and also taking diuretics  And ARB.  Generalized weakness associated with severe hyponatremia. Sodium 120 on admission, she received gentle hydration, we held diuretics, sodium improved from 120-132. She improved in terms of her weakness.  #2 nausea vomiting diarrhea secondary to gastroenteritis: Symptoms improved with so we could not check her C. difficile and also stool for PCR. Tolerated a regular diet.  #3 uncontrolled hypertension: Patient was given hydralazine during hospital stay, patient did not want to take diuretics anymore, she was   [place] and Maxzide At  Higher does ,due to her volume depletion and hyponatremia,I discontinued  MAxzide and added hydralazine for bp and de creased dose of EDARBI after discussing with her.  4.DMII;continue Metformin.    Discharge Condition: stable.   Follow UP  Follow-up Information    Rusty Aus, MD. Go on 02/14/2016.   Specialty:  Internal Medicine Why:  at 9:15 am  Contact information: [place] [address] [phone]             Discharge Instructions  and  Discharge Medications     Allergies as of 02/07/2016      Reactions   Penicillins Swelling, Rash, Other (See Comments)   Pt. Turn blue Has patient had a PCN reaction causing  immediate rash, facial/tongue/throat swelling, [name], then may proceed with Cephalosporin use.   Ace Inhibitors Other (See Comments)   Wasn't sleeping well   Celebrex [celecoxib] Cough   Statins Other (See Comments)   Joint pain   Iodinated Diagnostic Agents Swelling, Rash   Swelling of the ankles   Norpramin [desipramine] Rash   Phenobarbital Swelling, Rash   Swelling of the ankles   Sinequan [doxepin] Rash      Medication List    STOP taking these medications   nitroGLYCERIN 0.4 MG SL tablet Commonly known as:  NITROSTAT   triamterene-hydrochlorothiazide 75-50 MG tablet Commonly known as:  MAXZIDE     TAKE these medications   albuterol 108 (90 Base) MCG/ACT inhaler Commonly known as:  VENTOLIN HFA Inhale 2 puffs into the lungs every 4 (four) hours as needed for wheezing. UAD   ALPRAZolam 0.5 MG tablet Commonly known as:  XANAX Take 0.25-0.5 mg by mouth at bedtime as needed for sleep.   aspirin 81 MG tablet Take 81 mg by mouth daily.   ASTEPRO NA Place 1 spray into the nose at bedtime as needed (congestion).   carvedilol 12.5 MG tablet Commonly known as:  COREG Take 1 tablet (12.5 mg total) by mouth 2 (two) times daily.   EDARBI 80 MG Tabs Generic drug:  Azilsartan Medoxomil Take 1 tablet by mouth daily.   esomeprazole 40 MG capsule Commonly known as:  NEXIUM Take 40 mg by mouth daily before breakfast.   ferrous sulfate 325 (65 FE) MG tablet Take 325 mg by mouth 2 (two) times daily with a meal.   gabapentin 100 MG capsule Commonly known as:  NEURONTIN Take 300 mg by mouth at bedtime.   hydrALAZINE 50 MG tablet Commonly known as:  APRESOLINE Take 1  tablet (50 mg total) by mouth 3 (three) times daily. What changed:  medication strength  how much to take  when to take this   LORazepam 1 MG tablet Commonly known as:  ATIVAN Take 1 mg by mouth at bedtime.   magnesium gluconate 500 MG tablet Commonly known as:  MAGONATE Take 500 mg by mouth daily.   Melatonin 5 MG Caps Take 1 capsule by mouth at bedtime as needed (sleep).   metFORMIN 500 MG tablet Commonly known as:  GLUCOPHAGE Take 500 mg by mouth daily.   NASONEX 50 MCG/ACT nasal spray Generic drug:  mometasone Place 2 sprays into the nose daily as needed (allergies).         Diet and Activity recommendation: See Discharge Instructions above   Consults obtained - none   Major procedures and Radiology Reports - PLEASE review detailed and final  reports for all details, in brief -      Ct Abdomen Pelvis Wo Contrast  Result Date: 02/04/2016 CLINICAL DATA:  Nausea, and diarrhea.  Lower abdominal discomfort. EXAM: CT ABDOMEN AND PELVIS WITHOUT CONTRAST TECHNIQUE: Multidetector CT imaging of the abdomen and pelvis was performed following the standard protocol without IV contrast. COMPARISON:  CTA 10/06/2010 FINDINGS: Lower chest: Minimal smooth septal thickening in the lung bases suggesting pulmonary edema. Heart is normal in size. There are mitral annulus calcifications. Hepatobiliary: No focal lesion allowing for lack contrast. Possible tiny gallstone within physiologically distended gallbladder. No biliary dilatation. Pancreas: No ductal dilatation or inflammation. Spleen: Normal in size without focal abnormality. Adrenals/Urinary Tract: No adrenal nodule. No hydronephrosis. 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 Mild nonspecific perinephric stranding. Urinary bladder is physiologically distended. No bladder wall thickening. Stomach/Bowel: Small hiatal hernia. Enteric contrast of distal esophagus can be seen with reflux  or slow transit. Stomach is physiologically distended with ingested contrast. No evidence of bowel wall thickening, distention or inflammation. Normal appendix. Minimal sigmoid colonic diverticulosis without inflammation. No colonic wall thickening. Vascular/Lymphatic: Aortic atherosclerosis. No enlarged abdominal or pelvic lymph nodes. Reproductive: Uterus and bilateral adnexa are unremarkable. Questionable phlebolith versus right ovarian calcification is unchanged. Other: No free air, free fluid, or intra-abdominal fluid collection. Tiny fat containing umbilical hernia. Musculoskeletal: Degenerative anterolisthesis of L4 on L5. Multilevel degenerative change throughout the lumbar spine. There are no acute or suspicious osseous abnormalities. IMPRESSION: 1. No explanation for diarrhea or lower abdominal discomfort. Minimal colonic diverticulosis without acute diverticulitis or colonic inflammation. 2. Small hiatal hernia containing enteric contrast, can be seen with reflux or delayed transit. 3. Possible tiny gallstone. 4. Suspect mild pulmonary edema at the lung bases. 5. Aortic atherosclerosis without aneurysm. Electronically Signed   By: Jeb Levering M.D.   On: 02/04/2016 02:26   Dg Chest 2 View  Result Date: 02/03/2016 CLINICAL DATA:  Cough for 3 weeks.  Weakness. EXAM: CHEST  2 VIEW COMPARISON:  Report from chest radiograph 11/20/2012, no images available. FINDINGS: Lungs are hyperinflated. Mild bronchial thickening. No focal airspace disease. No pulmonary edema, pleural fluid or pneumothorax. Heart size is normal, mild atherosclerosis of the thoracic aorta. Remote right rib fractures. Degenerative change of the left greater than right shoulder. IMPRESSION: 1. Hyperinflation and mild bronchial thickening, suggesting emphysema/COPD. 2. No acute abnormality. 3. Aortic atherosclerosis. Electronically Signed   By: Jeb Levering M.D.   On: 02/03/2016 21:37   Ct Head Wo Contrast  Result Date:  02/04/2016 CLINICAL DATA:  Hypertension and weakness.  Nausea. EXAM: CT HEAD WITHOUT CONTRAST TECHNIQUE: Contiguous axial images were obtained from the base of the skull through the vertex without intravenous contrast. COMPARISON:  None. FINDINGS: Brain: There is no intracranial hemorrhage, mass or evidence of acute infarction. There is mild generalized atrophy. There is mild chronic microvascular ischemic change. There is no significant extra-axial fluid collection. No acute intracranial findings are evident. Vascular: No hyperdense vessel or unexpected calcification. Skull: Normal. Negative for fracture or focal lesion. Sinuses/Orbits: No acute finding. Other: None. IMPRESSION: No acute intracranial findings. There is mild generalized atrophy and chronic appearing white matter hypodensities which likely represent small vessel ischemic disease. Electronically Signed   By: Andreas Newport M.D.   On: 02/04/2016 02:19    Micro Results     No results found for this or any previous visit (from the past 240 hour(s)).     Today  Subjective:   Amber Welch today has no headache,no chest abdominal pain,no new weakness tingling or numbness, feels much better wants to go home today.  Objective:   Blood pressure (!) 155/60, pulse 66, temperature 98.4 F (36.9 C), temperature source Oral, resp. rate 18, height 5\' 6"  (1.676 m), weight 102.1 kg (225 lb), SpO2 98 %.  No intake or output data in the 24 hours ending 02/09/16 1622  Exam Awake Alert, Oriented x 3, No new F.N deficits, Normal affect [place].AT,PERRAL Supple Neck,No JVD, No cervical lymphadenopathy appriciated.  Symmetrical Chest wall movement, Good air movement bilaterally, CTAB RRR,No Gallops,Rubs or new Murmurs, No Parasternal Heave +ve B.Sounds, Abd Soft, Non tender, No organomegaly appriciated, No rebound -guarding or rigidity. No Cyanosis, Clubbing or edema, No new Rash or bruise  Data Review   CBC w Diff:  Lab Results  Component  Value Date   WBC 8.5 02/04/2016   HGB 12.2 02/04/2016   HGB 8.7 (L) 09/22/2011   HCT 34.9 (L) 02/04/2016   HCT 24.8 (L) 06/08/2011   PLT 285 02/04/2016   PLT 221 09/22/2011   LYMPHOPCT 39 02/11/2015   LYMPHOPCT 19.6 06/08/2011   MONOPCT 10 02/11/2015   MONOPCT 11.5 06/08/2011   EOSPCT 3 02/11/2015   EOSPCT 2.3 06/08/2011   BASOPCT 1 02/11/2015   BASOPCT 0.3 06/08/2011    CMP:  Lab Results  Component Value Date   NA 132 (L) 02/07/2016   NA 134 (L) 09/22/2011   K 4.5 02/07/2016   K 3.5 09/22/2011   CL 103 02/07/2016   CL 99 09/22/2011   CO2 23 02/07/2016   CO2 28 09/22/2011   BUN 17 02/07/2016   BUN 14 09/22/2011   CREATININE 0.77 02/07/2016   CREATININE 0.94 09/22/2011   PROT 7.5 02/03/2016   ALBUMIN 4.0 02/03/2016   BILITOT 0.5 02/03/2016   ALKPHOS 77 02/03/2016   AST 24 02/03/2016   ALT 21 02/03/2016  .   Total Time in preparing paper work, data evaluation and todays exam - 15 minutes  Kallon Caylor M.D on 02/07/2016 at 4:22 PM    Note: This dictation was prepared with Dragon dictation along with smaller phrase technology. Any transcriptional errors that result from this process are unintentional.

## 2016-03-09 ENCOUNTER — Ambulatory Visit
Admission: RE | Admit: 2016-03-09 | Discharge: 2016-03-09 | Disposition: A | Discharge status: Home / Self Care | Payer: Medicare Other | Source: Ambulatory Visit | Attending: Internal Medicine | Admitting: Internal Medicine

## 2016-03-09 DIAGNOSIS — Z1231 Encounter for screening mammogram for malignant neoplasm of breast: Secondary | ICD-10-CM | POA: Diagnosis not present

## 2016-05-18 ENCOUNTER — Other Ambulatory Visit: Payer: Self-pay | Admitting: Internal Medicine

## 2016-05-18 DIAGNOSIS — M5116 Intervertebral disc disorders with radiculopathy, lumbar region: Secondary | ICD-10-CM

## 2016-05-28 ENCOUNTER — Ambulatory Visit
Admission: RE | Admit: 2016-05-28 | Discharge: 2016-05-28 | Disposition: A | Discharge status: Home / Self Care | Payer: Medicare Other | Source: Ambulatory Visit | Attending: Internal Medicine | Admitting: Internal Medicine

## 2016-05-28 DIAGNOSIS — M5116 Intervertebral disc disorders with radiculopathy, lumbar region: Secondary | ICD-10-CM

## 2016-07-19 DIAGNOSIS — M519 Unspecified thoracic, thoracolumbar and lumbosacral intervertebral disc disorder: Secondary | ICD-10-CM | POA: Insufficient documentation

## 2016-07-27 ENCOUNTER — Other Ambulatory Visit: Payer: Self-pay | Admitting: Neurology

## 2016-07-27 DIAGNOSIS — R200 Anesthesia of skin: Secondary | ICD-10-CM

## 2016-08-09 ENCOUNTER — Encounter: Payer: Self-pay | Admitting: Family

## 2016-08-12 ENCOUNTER — Other Ambulatory Visit: Payer: Medicare Other

## 2016-08-13 ENCOUNTER — Ambulatory Visit
Admission: RE | Admit: 2016-08-13 | Discharge: 2016-08-13 | Disposition: A | Discharge status: Home / Self Care | Payer: Medicare Other | Source: Ambulatory Visit | Attending: Neurology | Admitting: Neurology

## 2016-08-13 DIAGNOSIS — R200 Anesthesia of skin: Secondary | ICD-10-CM

## 2016-08-18 ENCOUNTER — Ambulatory Visit (HOSPITAL_COMMUNITY)
Admission: RE | Admit: 2016-08-18 | Discharge: 2016-08-18 | Disposition: A | Discharge status: Home / Self Care | Payer: Medicare Other | Source: Ambulatory Visit | Attending: Vascular Surgery | Admitting: Vascular Surgery

## 2016-08-18 ENCOUNTER — Ambulatory Visit (INDEPENDENT_AMBULATORY_CARE_PROVIDER_SITE_OTHER): Payer: Medicare Other | Admitting: Family

## 2016-08-18 ENCOUNTER — Encounter: Payer: Self-pay | Admitting: Family

## 2016-08-18 VITALS — BP 113/57 | HR 63 | Ht 66.0 in | Wt 229.2 lb

## 2016-08-18 DIAGNOSIS — E669 Obesity, unspecified: Secondary | ICD-10-CM

## 2016-08-18 DIAGNOSIS — I6523 Occlusion and stenosis of bilateral carotid arteries: Secondary | ICD-10-CM | POA: Diagnosis not present

## 2016-08-18 NOTE — Patient Instructions (Signed)
Stroke Prevention Some medical conditions and behaviors are associated with an increased chance of having a stroke. You may prevent a stroke by making healthy choices and managing medical conditions. How can I reduce my risk of having a stroke?  Stay physically active. Get at least 30 minutes of activity on most or all days.  Do not smoke. It may also be helpful to avoid exposure to secondhand smoke.  Limit alcohol use. Moderate alcohol use is considered to be:  No more than 2 drinks per day for men.  No more than 1 drink per day for nonpregnant women.  Eat healthy foods. This involves:  Eating 5 or more servings of fruits and vegetables a day.  Making dietary changes that address high blood pressure (hypertension), high cholesterol, diabetes, or obesity.  Manage your cholesterol levels.  Making food choices that are high in fiber and low in saturated fat, trans fat, and cholesterol may control cholesterol levels.  Take any prescribed medicines to control cholesterol as directed by your health care provider.  Manage your diabetes.  Controlling your carbohydrate and sugar intake is recommended to manage diabetes.  Take any prescribed medicines to control diabetes as directed by your health care provider.  Control your hypertension.  Making food choices that are low in salt (sodium), saturated fat, trans fat, and cholesterol is recommended to manage hypertension.  Ask your health care provider if you need treatment to lower your blood pressure. Take any prescribed medicines to control hypertension as directed by your health care provider.  If you are 18-39 years of age, have your blood pressure checked every 3-5 years. If you are 40 years of age or older, have your blood pressure checked every year.  Maintain a healthy weight.  Reducing calorie intake and making food choices that are low in sodium, saturated fat, trans fat, and cholesterol are recommended to manage  weight.  Stop drug abuse.  Avoid taking birth control pills.  Talk to your health care provider about the risks of taking birth control pills if you are over 35 years old, smoke, get migraines, or have ever had a blood clot.  Get evaluated for sleep disorders (sleep apnea).  Talk to your health care provider about getting a sleep evaluation if you snore a lot or have excessive sleepiness.  Take medicines only as directed by your health care provider.  For some people, aspirin or blood thinners (anticoagulants) are helpful in reducing the risk of forming abnormal blood clots that can lead to stroke. If you have the irregular heart rhythm of atrial fibrillation, you should be on a blood thinner unless there is a good reason you cannot take them.  Understand all your medicine instructions.  Make sure that other conditions (such as anemia or atherosclerosis) are addressed. Get help right away if:  You have sudden weakness or numbness of the face, arm, or leg, especially on one side of the body.  Your face or eyelid droops to one side.  You have sudden confusion.  You have trouble speaking (aphasia) or understanding.  You have sudden trouble seeing in one or both eyes.  You have sudden trouble walking.  You have dizziness.  You have a loss of balance or coordination.  You have a sudden, severe headache with no known cause.  You have new chest pain or an irregular heartbeat. Any of these symptoms may represent a serious problem that is an emergency. Do not wait to see if the symptoms will go away.   Get medical help at once. Call your local emergency services (911 in [place] Do not drive yourself to the hospital. This information is not intended to replace advice given to you by your health care provider. Make sure you discuss any questions you have with your health care provider. Document Released: 01/27/2004 Document Revised: 05/27/2015 Document Reviewed: 06/21/2012 Elsevier  Interactive Patient Education  2017 [employer]  

## 2016-08-18 NOTE — Progress Notes (Signed)
Chief Complaint: Follow up Extracranial Carotid Artery Stenosis   History of Present Illness  Amber Welch is a 82 y.o. female patient of Dr. Bridgett Larsson who has known carotid artery stenosis and returns today for follow up.  She has not had previous carotid artery intervention.  She has no history of TIA or stroke symptoms. Specifically she denies a history of amaurosis fugax or monocular blindness, unilateral facial drooping, hemiplegia, or receptive or expressive aphasia.   Pt has generalized arthritis. She sees a Restaurant manager, fast food for back issues.  She states she will ned a left shoulder replacement.   She has labile blood pressure; had a renal artery duplex in March of 2017 requested by Dr. Rockey Situ, results are below.  Her walking is limited by her painful feet, she reports having had several surgeries on her feet.  Pt Diabetic: Yes, in good control, states last A1C was 6.? Pt smoker: non-smoker  Pt meds include: Statin : No: had myalgias from simvastatin ASA: Yes Other anticoagulants/antiplatelets: no   Past Medical History:  Diagnosis Date  . Anxiety   . Arthritis    Back and knees  . Asthma    a. chronic SOB  . Carotid artery disease ([place])    a. carotid doppler 2016: 36% RICA, <46% LICA  . COPD (chronic obstructive pulmonary disease) ([place])   . Depression   . Diabetes type 2, controlled ([place])   . Hyperlipidemia   . Insomnia   . Labile hypertension    a. when she is stressed BP will increase to the 803'O systolic  . Medication intolerance   . Morbid obesity ([place])   . Normal coronary arteries    a. normal cath 2003; b. normal Myoview in 2008 & 2009  . OSA (obstructive sleep apnea)    a. BiPAP  . Physical deconditioning     Social History Social History  Substance Use Topics  . Smoking status: Never Smoker  . Smokeless tobacco: Never Used     Comment: tobacco use - no  . Alcohol use No    Family History Family History  Problem Relation Age of Onset   . Heart disease Father        Heart Disease before age 72  . Diabetes Father   . Hyperlipidemia Father   . Heart attack Father   . Heart disease Mother   . Hypertension Mother   . Heart attack Mother   . Breast cancer Neg Hx     Surgical History Past Surgical History:  Procedure Laterality Date  . COLONOSCOPY WITH PROPOFOL N/A 07/31/2014   Procedure: COLONOSCOPY WITH PROPOFOL;  Surgeon: Hulen Luster, MD;  Location: [facility] ENDOSCOPY;  Service: Gastroenterology;  Laterality: N/A;  . DILATION AND CURETTAGE OF UTERUS    . FOOT SURGERY     Left foot, bunionectomy and hammer toe  . JOINT REPLACEMENT  06/05/11   Right knee replaced  . JOINT REPLACEMENT  09/20/11   Left knee  . right foot surgery Right April 2015   Right Foot and Ankle  . right knee surgery     arthroscopy for torn meniscus    Allergies  Allergen Reactions  . Penicillins Swelling, Rash and Other (See Comments)    [name], facial/tongue/throat swelling, SOB or lightheadedness with hypotension: Yes Has patient had a PCN reaction causing severe rash involving mucus membranes or skin necrosis: No Has patient had a PCN reaction that required hospitalization No  Has patient had a PCN reaction occurring within the last 10 years: No If all of the above answers are "NO", then may proceed with Cephalosporin use.   . Ace Inhibitors Other (See Comments)    Wasn't sleeping well  . Celebrex [Celecoxib] Cough  . Statins Other (See Comments)    Joint pain  . Iodinated Diagnostic Agents Swelling and Rash    Swelling of the ankles  . Norpramin [Desipramine] Rash  . Phenobarbital Swelling and Rash    Swelling of the ankles  . Sinequan [Doxepin] Rash    Current Outpatient Prescriptions  Medication Sig Dispense Refill  . albuterol (VENTOLIN HFA) 108 (90 Base) MCG/ACT inhaler Inhale 2 puffs into the lungs every 4 (four) hours as needed for wheezing. UAD 1 Inhaler 12  .  ALPRAZolam (XANAX) 0.5 MG tablet Take 0.25-0.5 mg by mouth at bedtime as needed for sleep.    Marland Kitchen aspirin 81 MG tablet Take 81 mg by mouth daily.    . Azelastine HCl (ASTEPRO NA) Place 1 spray into the nose at bedtime as needed (congestion).     . Azilsartan Medoxomil (EDARBI) 80 MG TABS Take 1 tablet by mouth daily.     Marland Kitchen esomeprazole (NEXIUM) 40 MG capsule Take 40 mg by mouth daily before breakfast.      . gabapentin (NEURONTIN) 100 MG capsule Take 200 mg by mouth at bedtime.     . gabapentin (NEURONTIN) 300 MG capsule Take 1 capsule by mouth at bedtime.    . hydrALAZINE (APRESOLINE) 50 MG tablet Take 1 tablet (50 mg total) by mouth 3 (three) times daily. 60 tablet 0  . LORazepam (ATIVAN) 1 MG tablet Take 1 mg by mouth at bedtime.    . magnesium gluconate (MAGONATE) 500 MG tablet Take 500 mg by mouth daily.    . Melatonin 5 MG CAPS Take 1 capsule by mouth at bedtime as needed (sleep).     . metFORMIN (GLUCOPHAGE) 500 MG tablet Take 500 mg by mouth daily.      . mometasone (NASONEX) 50 MCG/ACT nasal spray Place 2 sprays into the nose daily as needed (allergies).     . carvedilol (COREG) 12.5 MG tablet Take 1 tablet (12.5 mg total) by mouth 2 (two) times daily. 60 tablet 6  . ferrous sulfate 325 (65 FE) MG tablet Take 325 mg by mouth 2 (two) times daily with a meal.     No current facility-administered medications for this visit.     Review of Systems : See HPI for pertinent positives and negatives.  Physical Examination  Vitals:   08/18/16 1440 08/18/16 1443  BP: (!) 113/59 (!) 113/57  Pulse: 65 63  Weight: 229 lb 3.2 oz (104 kg)   Height: 5\' 6"  (1.676 m)   Respirations are 18/minute Body mass index is 36.99 kg/m.  General: WDWN obese female in NAD GAIT:antalgic Eyes: PERRLA Pulmonary: Respirations are non-labored, CTAB, good air movement. Cardiac: regular rhythm and rate, no detected murmur.  VASCULAR EXAM Carotid Bruits Right Left   Positive positive   Radial  pulses are 2+ palpable and equal. Abdominal aortic pulse is not palpable      LE Pulses Right Left   POPLITEAL not palpable  not palpable   POSTERIOR TIBIAL 2+ palpable  2+ palpable    DORSALIS PEDIS  ANTERIOR TIBIAL 2+ palpable  2+ palpable     Gastrointestinal: soft, nontender, BS WNL, no r/g,no palpable masses.  Musculoskeletal: No muscle atrophy/wasting. M/S 5/5 in UE's, 4/5  in LE's, Extremities without ischemic changes.  Neurologic: A&O X 3; appropriate affect,speech is normal, CN 2-12 intact, Pain and light touch intact in extremities, Motor exam as listed above.    Assessment: Amber Welch is a 82 y.o. female who has no history of stroke or TIA.  Her atherosclerotic risk factors include currently controlled DM, obesity, statin intolerance, and limited mobility.  Fortunately she has never used tobacco. She takes a daily ASA.  DATA Carotid Duplex (08/18/16): Bilateral internal carotid arterieswith<40% stenosis, right ICA stenosis at the higher end of the range. Bilateral vertebral artery flow is antegrade.  Bilateral subclavian artery waveforms are normal.  No significant change compared to the last exam on 08-13-15.     03/11/15 renal artery duplex: Normal caliber abdominal aorta. Normal bilateral kidney size. Echogenic structure in the mid to upper right kidney, as described above. Normal renal arteries, bilaterally, serpentine, which may account for elevated velocity in the mid left renal artery. The IVC and renal veins are patent.     Plan: Follow-up in 1 year with Carotid Duplex scan.   I discussed in depth with the patient the nature of atherosclerosis, and emphasized the importance of maximal medical management including strict control of blood pressure, blood glucose, and lipid levels,  obtaining regular exercise, and continued cessation of smoking.  The patient is aware that without maximal medical management the underlying atherosclerotic disease process will progress, limiting the benefit of any interventions. The patient was given information about stroke prevention and what symptoms should prompt the patient to seek immediate medical care. Thank you for allowing [place] to participate in this patient's care.  Clemon Chambers, RN, MSN, FNP-C [facility] Office: [phone]  Clinic Physician: Donzetta Matters  08/18/16 2:51 PM

## 2016-08-21 NOTE — Addendum Note (Signed)
Addended by: Lianne Cure A on: 08/21/2016 10:49 AM   Modules accepted: Orders

## 2016-09-12 ENCOUNTER — Other Ambulatory Visit: Payer: Self-pay | Admitting: Cardiovascular Disease

## 2016-09-18 DIAGNOSIS — R7989 Other specified abnormal findings of blood chemistry: Secondary | ICD-10-CM | POA: Insufficient documentation

## 2016-10-11 ENCOUNTER — Other Ambulatory Visit: Payer: Self-pay | Admitting: Cardiovascular Disease

## 2016-10-20 ENCOUNTER — Telehealth: Payer: Self-pay | Admitting: Cardiovascular Disease

## 2016-10-20 NOTE — Telephone Encounter (Signed)
Patient not interested in scheduling an appt at this time.  She had PCP refill medications   She will call us in the future if she wants to be seen

## 2016-10-20 NOTE — Telephone Encounter (Signed)
-----   Message from Janan Ridge, [place] sent at 10/11/2016  1:15 PM EDT ----- Will you try to schedule an appointment with Dr. Rockey Situ. Thank you!

## 2016-10-20 NOTE — Telephone Encounter (Signed)
Lmov for patient needs an appointment with Dr Rockey Situ

## 2016-11-10 DIAGNOSIS — M47812 Spondylosis without myelopathy or radiculopathy, cervical region: Secondary | ICD-10-CM | POA: Insufficient documentation

## 2017-01-24 ENCOUNTER — Other Ambulatory Visit: Payer: Self-pay | Admitting: Internal Medicine

## 2017-01-24 DIAGNOSIS — Z1231 Encounter for screening mammogram for malignant neoplasm of breast: Secondary | ICD-10-CM

## 2017-03-12 ENCOUNTER — Ambulatory Visit
Admission: RE | Admit: 2017-03-12 | Discharge: 2017-03-12 | Disposition: A | Discharge status: Home / Self Care | Payer: Medicare Other | Source: Ambulatory Visit | Attending: Internal Medicine | Admitting: Internal Medicine

## 2017-03-12 DIAGNOSIS — Z1231 Encounter for screening mammogram for malignant neoplasm of breast: Secondary | ICD-10-CM

## 2017-07-24 DIAGNOSIS — E1151 Type 2 diabetes mellitus with diabetic peripheral angiopathy without gangrene: Secondary | ICD-10-CM | POA: Insufficient documentation

## 2018-01-31 ENCOUNTER — Other Ambulatory Visit: Payer: Self-pay | Admitting: Internal Medicine

## 2018-01-31 DIAGNOSIS — Z1231 Encounter for screening mammogram for malignant neoplasm of breast: Secondary | ICD-10-CM

## 2018-02-11 ENCOUNTER — Telehealth: Payer: Self-pay | Admitting: Cardiovascular Disease

## 2018-02-11 NOTE — Telephone Encounter (Signed)
Pt c/o Shortness Of Breath: STAT if SOB developed within the last 24 hours or pt is noticeably SOB on the phone  1. Are you currently SOB (can you hear that pt is SOB on the phone)? Not at the moment  2. How long have you been experiencing SOB? About a month   3. Are you SOB when sitting or when up moving around? When moving around   4. Are you currently experiencing any other symptoms? Feelings of weakness, heart flutters, blacked out a little yesterday    Patient scheduled to see Dr Rockey Situ tomorrow at [place]

## 2018-02-11 NOTE — Progress Notes (Signed)
Cardiology Office Note  Date:  02/12/2018   ID:  GAIL VENDETTI, DOB 08/01/1934, MRN [id number]  PCP:  Rusty Aus, MD   Chief Complaint  Patient presents with  . other    OD f/u LS 08/2015 c/o sob. Meds reviewed verbally with pt.    HPI:  Freda Jaquith  is a 84 y.o. woman with  hypertension, hyperlipidemia, diabetes, morbid obesity, obstructive sleep apnea on BiPAP, depression, and mild asthma, previous symptoms of atypical chest pain, chronic shortness of breath felt secondary to asthma and obesity, previously seen for evaluation of chest tightness, shortness of breath, labile severe hypertension. Remote history of labile blood pressures documented in cardiology note from 2013, sometimes with hypotension systolic in the 40J, other times with systolic pressure 811B --History of <40%  carotid disease She presents today to discuss her blood pressure, shortness of breath, dizzy spell  INTERVAL HISTORY:  The patient reports today for follow up. She is accompanied by her husband.  This past Sunday, 02/10/2018, at church she felt as if she was going to pass out, but reports she never did.  She had stood up to receive communion walked around the church came back to sit down when symptoms first started.  associated symptoms include diaphoresis, chills, weakness, SOB when walking, and increasing fatigue. She declined workup at that time.   She was told her sodium was low so she has decreased her water intake, hoping to increase her sodium. Her blood pressure is low today. 98 systolic at first with nursing, but has since raised to 147 systolic on my recheck  She is concerned that carvedilol is making her tired  The patient reports not feeling as happy as she used to be, adding she has a history of depression in the 80's.  She does not want medications, took a long time to come off them  Her neuropathy has made it harder for her to keep her balance. She also reports pain in her bilateral knees and  feet, stating she has had knee replacements and surgery on her feet in the past.   She does not have a regular exercise program and is interested in the Lung works program offered at the hospital.   When she takes her blood pressure at home she has noticed her heart rate will range 40-50.   Total Chol 213/ LDL 134 HBA1C 6.7  She has normal renal function.   EKG personally reviewed by myself on todays visit Shows normal rhythm. 69 bpm. With PAC   OTHER PAST MEDICAL HISTORY REVIEWED BY ME FOR TODAY'S VISIT: Previously reported significant stress at home. Husband is going deaf, place the TV very loud in the evenings. Feels very anxious, this is been a chronic issue. In the past was taking Xanax during the daytime for help with her symptoms. Currently has severe insomnia, recently started on Ativan before bed. Reports only sleeping 2-3 hours per night. She has been reluctant to take SSRI.  Significant medication allergies, calcium channel blockers make her skin sore. Previously reported Gets very anxious checking her blood pressure, sometimes gets nausea.  arthritis in her knees, hips, back   [facility].  Myoview  showed normal ejection fraction and normal perfusion.  similar episode in 2008 for which she also had a normal Myoview.  She also had a Holter monitor with Dr. Mable Fill which showed PACs.  F/u echo in 12/09 showed normal LV function with mild MR. she presents for  routine followup. She has chronic shortness of breath likely secondary to asthma and obesity.   She had remote cardiac catheterization around 2003 which was normal.   PMH:   has a past medical history of Anxiety, Arthritis, Asthma, Carotid artery disease ([place]), COPD (chronic obstructive pulmonary disease) ([place]), Depression, Diabetes type 2, controlled ([place]), Hyperlipidemia, Insomnia, Labile hypertension, Medication intolerance, Morbid obesity ([place]), Normal coronary arteries, OSA  (obstructive sleep apnea), and Physical deconditioning.  PSH:    Past Surgical History:  Procedure Laterality Date  . COLONOSCOPY WITH PROPOFOL N/A 07/31/2014   Procedure: COLONOSCOPY WITH PROPOFOL;  Surgeon: Hulen Luster, MD;  Location: [facility] ENDOSCOPY;  Service: Gastroenterology;  Laterality: N/A;  . DILATION AND CURETTAGE OF UTERUS    . FOOT SURGERY     Left foot, bunionectomy and hammer toe  . JOINT REPLACEMENT  06/05/11   Right knee replaced  . JOINT REPLACEMENT  09/20/11   Left knee  . right foot surgery Right April 2015   Right Foot and Ankle  . right knee surgery     arthroscopy for torn meniscus    Current Outpatient Medications  Medication Sig Dispense Refill  . albuterol (VENTOLIN HFA) 108 (90 Base) MCG/ACT inhaler Inhale 2 puffs into the lungs every 4 (four) hours as needed for wheezing. UAD 1 Inhaler 12  . ALPRAZolam (XANAX) 0.5 MG tablet Take 0.25-0.5 mg by mouth at bedtime as needed for sleep.    Marland Kitchen aspirin 81 MG tablet Take 81 mg by mouth daily.    . Azelastine HCl (ASTEPRO NA) Place 1 spray into the nose at bedtime as needed (congestion).     . Azilsartan Medoxomil (EDARBI) 80 MG TABS Take 1 tablet by mouth daily.     . carvedilol (COREG) 12.5 MG tablet TAKE 1 TABLET BY MOUTH TWICE A DAY. 60 tablet 0  . docusate sodium (COLACE) 100 MG capsule Take 250 mg by mouth daily.    Marland Kitchen esomeprazole (NEXIUM) 40 MG capsule Take 40 mg by mouth daily before breakfast.      . ferrous gluconate (FERGON) 324 MG tablet Take by mouth daily.    Marland Kitchen gabapentin (NEURONTIN) 100 MG capsule Take 200 mg by mouth at bedtime.     . gabapentin (NEURONTIN) 300 MG capsule Take 1 capsule by mouth at bedtime.    . hydrALAZINE (APRESOLINE) 25 MG tablet Take 25 mg by mouth 2 (two) times daily.    Marland Kitchen LORazepam (ATIVAN) 1 MG tablet Take 1 mg by mouth at bedtime.    . magnesium gluconate (MAGONATE) 500 MG tablet Take 500 mg by mouth daily.    . Melatonin 5 MG CAPS Take 1 capsule by mouth at bedtime as needed  (sleep).     . metFORMIN (GLUCOPHAGE) 500 MG tablet Take 500 mg by mouth daily.      . mometasone (NASONEX) 50 MCG/ACT nasal spray Place 2 sprays into the nose daily as needed (allergies).     . pravastatin (PRAVACHOL) 20 MG tablet Take 20 mg by mouth daily.      No current facility-administered medications for this visit.      Allergies:   Penicillins; Ace inhibitors; Celebrex [celecoxib]; Statins; Iodinated diagnostic agents; Norpramin [desipramine]; Phenobarbital; and Sinequan [doxepin]   Social History:  The patient  reports that she has never smoked. She has never used smokeless tobacco. She reports that she does not drink alcohol or use drugs.   Family History:   family history includes Diabetes in her father; Heart attack  in her father and mother; Heart disease in her father and mother; Hyperlipidemia in her father; Hypertension in her mother.    Review of Systems: Review of Systems  Constitutional: Positive for chills and diaphoresis.  Eyes: Negative.   Respiratory: Positive for shortness of breath.   Cardiovascular: Negative.  Negative for chest pain.  Gastrointestinal: Negative.   Genitourinary: Negative.   Musculoskeletal: Negative.   Neurological: Positive for dizziness and weakness. Negative for loss of consciousness.  Psychiatric/Behavioral: Negative.   All other systems reviewed and are negative.    PHYSICAL EXAM: VS:  BP (!) 110/50 (BP Location: Left Arm, Patient Position: Sitting)   Pulse 69   Ht 5\' 5"  (1.651 m)   Wt 228 lb 4 oz (103.5 kg)   BMI 37.98 kg/m  , BMI Body mass index is 37.98 kg/m.  Constitutional:  oriented to person, place, and time. No distress.  Obese HENT:  Head: Grossly normal Eyes:  no discharge. No scleral icterus.  Neck: No JVD, no carotid bruits  Cardiovascular: Regular rate and rhythm, no murmurs appreciated Pulmonary/Chest: Clear to auscultation bilaterally, no wheezes or rails Abdominal: Soft.  no distension.  no tenderness.   Musculoskeletal: Normal range of motion Neurological:  normal muscle tone. Coordination normal. No atrophy Skin: Skin warm and dry Psychiatric: normal affect, pleasant   Recent Labs: No results found for requested labs within last 8760 hours.    Lipid Panel No results found for: CHOL, HDL, LDLCALC, TRIG    Wt Readings from Last 3 Encounters:  02/12/18 228 lb 4 oz (103.5 kg)  08/18/16 229 lb 3.2 oz (104 kg)  02/03/16 225 lb (102.1 kg)       ASSESSMENT AND PLAN:  Carotid artery stenosis, bilateral  Plan: EKG 12-Lead Less than 39% disease bilaterally followed in [place] on annual basis  Hyperlipidemia Plan: History of statin intolerance Previous lipid panel 01/21/2018 total cholesterol 213, LDL 134 Could consider Zetia  Labile blood pressure Plan: Low 98/50, 110/50 on today's visit Recent episode of dizziness at church possibly from orthostasis Long discussion concerning her blood pressure and management --Please decrease the coreg down to 6.25 mg twice a day Continue to monitor your blood pressure in the late morning, and when she starts to experience a dizzy episode.  Shortness of breath Plan: Chronic mild shortness of breath most likely secondary to deconditioning Unable to exclude underlying ischemia.  Recommended regular exercise program Recommend echocardiogram  Recommend the [facility] program, she will call if she would like to attend Could consider stress testing at a later date to rule out ischemia  Morbid obesity due to excess calories ([place]) We have encouraged continued exercise, careful diet management in an effort to lose weight.  Chronic longstanding issue  Long discussion with her today concerning numerous issues as above, discussed various types of cardiac testing, causes for dizziness, causes for shortness of breath, discussed depression symptoms Total encounter time more than 45 minutes Greater than 50% was spent in counseling and coordination  of care with the patient   Disposition:   F/U  PRN   Orders Placed This Encounter  Procedures  . EKG 12-Lead   I, Margit Banda am acting as a scribe for Ida Rogue, M.D., Ph.D.  I have reviewed the above documentation for accuracy and completeness, and I agree with the above.   Signed, Esmond Plants, M.D., Ph.D. 02/12/2018  [place], [place]

## 2018-02-11 NOTE — Telephone Encounter (Signed)
No answer. Left message to call back.   

## 2018-02-12 ENCOUNTER — Ambulatory Visit: Payer: Medicare Other | Admitting: Cardiovascular Disease

## 2018-02-12 ENCOUNTER — Encounter: Payer: Self-pay | Admitting: Cardiovascular Disease

## 2018-02-12 VITALS — BP 110/50 | HR 69 | Ht 65.0 in | Wt 228.2 lb

## 2018-02-12 DIAGNOSIS — R0789 Other chest pain: Secondary | ICD-10-CM | POA: Diagnosis not present

## 2018-02-12 DIAGNOSIS — R0602 Shortness of breath: Secondary | ICD-10-CM | POA: Diagnosis not present

## 2018-02-12 MED ORDER — CARVEDILOL 6.25 MG PO TABS: 6.2500 mg | ORAL_TABLET | Freq: Two times a day (BID) | ORAL | 3 refills | Status: DC

## 2018-02-12 NOTE — Telephone Encounter (Signed)
Patient currently in office visit with Dr Rockey Situ. Closing encounter.

## 2018-02-12 NOTE — Patient Instructions (Addendum)
Call if you would like the [facility] program   Medication Instructions:  Your physician has recommended you make the following change in your medication:  1. DECREASE Carvedilol down to 6.25 mg twice a day  Continue to monitor your blood pressure in the late morning  If you need a refill on your cardiac medications before your next appointment, please call your pharmacy.    Lab work: No new labs needed   If you have labs (blood work) drawn today and your tests are completely normal, you will receive your results only by: Marland Kitchen MyChart Message (if you have MyChart) OR . A paper copy in the mail If you have any lab test that is abnormal or we need to change your treatment, we will call you to review the results.   Testing/Procedures: We will order an echocardiogram for shortness of breath Your physician has requested that you have an echocardiogram. Echocardiography is a painless test that uses sound waves to create images of your heart. It provides your doctor with information about the size and shape of your heart and how well your heart's chambers and valves are working. This procedure takes approximately one hour. There are no restrictions for this procedure.    Follow-Up: At [facility], you and your health needs are our priority.  As part of our continuing mission to provide you with exceptional heart care, we have created designated Provider Care Teams.  These Care Teams include your primary Cardiologist (physician) and Advanced Practice Providers (APPs -  Physician Assistants and Nurse Practitioners) who all work together to provide you with the care you need, when you need it.  . You will need a follow up appointment as needed We will call you with the results   . Providers on your designated Care Team:   . Murray Hodgkins, NP . Christell Faith, PA-C . Marrianne Mood, PA-C  Any Other Special Instructions Will Be Listed Below (If Applicable).  For educational health  videos Log in to : [email or web address] Or : [email or web address], password : [email or web address]     Echocardiogram An echocardiogram is a procedure that uses painless sound waves (ultrasound) to produce an image of the heart. Images from an echocardiogram can provide important information about:  Signs of coronary artery disease (CAD).  Aneurysm detection. An aneurysm is a weak or damaged part of an artery wall that bulges out from the normal force of blood pumping through the body.  Heart size and shape. Changes in the size or shape of the heart can be associated with certain conditions, including heart failure, aneurysm, and CAD.  Heart muscle function.  Heart valve function.  Signs of a past heart attack.  Fluid buildup around the heart.  Thickening of the heart muscle.  A tumor or infectious growth around the heart valves. Tell a health care provider about:  Any allergies you have.  All medicines you are taking, including vitamins, herbs, eye drops, creams, and over-the-counter medicines.  Any blood disorders you have.  Any surgeries you have had.  Any medical conditions you have.  Whether you are pregnant or may be pregnant. What are the risks? Generally, this is a safe procedure. However, problems may occur, including:  Allergic reaction to dye (contrast) that may be used during the procedure. What happens before the procedure? No specific preparation is needed. You may eat and drink normally. What happens during the procedure?   An IV tube may be inserted into one of your veins.  You may  receive contrast through this tube. A contrast is an injection that improves the quality of the pictures from your heart.  A gel will be applied to your chest.  A wand-like tool (transducer) will be moved over your chest. The gel will help to transmit the sound waves from the transducer.  The sound waves will harmlessly bounce off of your heart to allow the heart images to be captured in  real-time motion. The images will be recorded on a computer. The procedure may vary among health care providers and hospitals. What happens after the procedure?  You may return to your normal, everyday life, including diet, activities, and medicines, unless your health care provider tells you not to do that. Summary  An echocardiogram is a procedure that uses painless sound waves (ultrasound) to produce an image of the heart.  Images from an echocardiogram can provide important information about the size and shape of your heart, heart muscle function, heart valve function, and fluid buildup around your heart.  You do not need to do anything to prepare before this procedure. You may eat and drink normally.  After the echocardiogram is completed, you may return to your normal, everyday life, unless your health care provider tells you not to do that. This information is not intended to replace advice given to you by your health care provider. Make sure you discuss any questions you have with your health care provider. Document Released: 12/17/1999 Document Revised: 01/22/2016 Document Reviewed: 01/22/2016 Elsevier Interactive Patient Education  2019 [employer].

## 2018-02-26 ENCOUNTER — Other Ambulatory Visit: Payer: Self-pay

## 2018-02-26 DIAGNOSIS — I6523 Occlusion and stenosis of bilateral carotid arteries: Secondary | ICD-10-CM

## 2018-03-04 ENCOUNTER — Ambulatory Visit: Payer: Medicare Other | Admitting: Physician Assistant

## 2018-03-04 ENCOUNTER — Encounter (HOSPITAL_COMMUNITY): Payer: Medicare Other

## 2018-03-04 ENCOUNTER — Encounter: Payer: Self-pay | Admitting: Physician Assistant

## 2018-03-04 ENCOUNTER — Ambulatory Visit (HOSPITAL_COMMUNITY)
Admission: RE | Admit: 2018-03-04 | Discharge: 2018-03-04 | Disposition: A | Discharge status: Home / Self Care | Payer: Medicare Other | Source: Ambulatory Visit | Attending: Family | Admitting: Family

## 2018-03-04 ENCOUNTER — Ambulatory Visit: Payer: Medicare Other | Admitting: Family

## 2018-03-04 ENCOUNTER — Other Ambulatory Visit: Payer: Self-pay

## 2018-03-04 VITALS — BP 122/77 | HR 62 | Temp 97.0°F | Resp 16 | Ht 65.0 in | Wt 229.3 lb

## 2018-03-04 DIAGNOSIS — I6523 Occlusion and stenosis of bilateral carotid arteries: Secondary | ICD-10-CM

## 2018-03-05 NOTE — Progress Notes (Signed)
Established Carotid Patient   History of Present Illness   Amber Welch is a 84 y.o. ([birth year]) female who presents with chief complaint: carotid artery stenosis.  Previous carotid studies demonstrated: RICA 2-75% stenosis, LICA 1-70% stenosis.  Patient has no history of TIA or stroke symptom.  The patient has not had amaurosis fugax or monocular blindness.  The patient has not had facial drooping or hemiplegia.  The patient has not had receptive or expressive aphasia.  She is following regularly with PCP for chronic medical conditions including COPD.  She is trying to adopt a more active lifestyle.  Current Outpatient Medications  Medication Sig Dispense Refill  . albuterol (VENTOLIN HFA) 108 (90 Base) MCG/ACT inhaler Inhale 2 puffs into the lungs every 4 (four) hours as needed for wheezing. UAD 1 Inhaler 12  . aspirin 81 MG tablet Take 81 mg by mouth daily.    . Azelastine HCl (ASTEPRO NA) Place 1 spray into the nose at bedtime as needed (congestion).     . Azilsartan Medoxomil (EDARBI) 80 MG TABS Take 1 tablet by mouth daily.     . carvedilol (COREG) 6.25 MG tablet Take 1 tablet (6.25 mg total) by mouth 2 (two) times daily. 60 tablet 3  . docusate sodium (COLACE) 100 MG capsule Take 100 mg by mouth 2 (two) times daily.     Marland Kitchen esomeprazole (NEXIUM) 40 MG capsule Take 40 mg by mouth daily before breakfast.      . ferrous gluconate (FERGON) 324 MG tablet Take by mouth daily.    Marland Kitchen gabapentin (NEURONTIN) 100 MG capsule Take 200 mg by mouth at bedtime.     . hydrALAZINE (APRESOLINE) 25 MG tablet Take 25 mg by mouth 2 (two) times daily.    Marland Kitchen LORazepam (ATIVAN) 1 MG tablet Take 1 mg by mouth at bedtime.    . magnesium gluconate (MAGONATE) 500 MG tablet Take 500 mg by mouth daily.    . Melatonin 5 MG CAPS Take 1 capsule by mouth at bedtime as needed (sleep).     . metFORMIN (GLUCOPHAGE) 500 MG tablet Take 500 mg by mouth daily.      . mometasone (NASONEX) 50 MCG/ACT nasal spray Place 2  sprays into the nose daily as needed (allergies).     . pravastatin (PRAVACHOL) 20 MG tablet Take 20 mg by mouth daily.     Marland Kitchen ALPRAZolam (XANAX) 0.5 MG tablet Take 0.25-0.5 mg by mouth at bedtime as needed for sleep.    Marland Kitchen gabapentin (NEURONTIN) 300 MG capsule Take 2 capsules by mouth at bedtime.      No current facility-administered medications for this visit.     On ROS today: 10 system ROS in negative unless otherwise noted in HPI   Physical Examination   Vitals:   03/04/18 1103 03/04/18 1107  BP: 121/71 122/77  Pulse: 62   Resp: 16   Temp: (!) 97 F (36.1 C)   TempSrc: Oral   SpO2: 95%   Weight: 229 lb 4.5 oz (104 kg)   Height: 5\' 5"  (1.651 m)    Body mass index is 38.15 kg/m.  General Alert, O x 3, WD, NAD  Neck Supple, mid-line trachea,    Pulmonary Sym exp, good B air movt  Cardiac RRR, Nl S1, S2  Vascular Vessel Right Left  Radial Palpable Palpable  Carotid Palpable, No Bruit Palpable, No Bruit  Aorta Not palpable N/A    Gastro- intestinal soft, non-distended, non-tender to palpation,  Musculo- skeletal M/S 5/5 throughout  , Extremities without ischemic changes    Neurologic Cranial nerves 2-12 intact    Non-Invasive Vascular Imaging   B Carotid Duplex (03/04/18):   R ICA stenosis:  1-39%  R VA:  patent and antegrade  L ICA stenosis:  1-39%  L VA:  patent and antegrade   Medical Decision Making   Amber Welch is a 84 y.o. female who presents for carotid artery stenosis   Carotid duplex unchanged over the past 18 months with bilateral ICA less than 40% stenosis  Continue aspirin and statin daily  Recheck carotid duplex in another 18 to 24 months  Follow regularly with PCP for management of other chronic medical conditions  Dagoberto Ligas PA-C [facility] Office: [phone]  Clinic MD: Dr. Trula Slade

## 2018-03-08 ENCOUNTER — Ambulatory Visit (INDEPENDENT_AMBULATORY_CARE_PROVIDER_SITE_OTHER): Payer: Medicare Other

## 2018-03-08 DIAGNOSIS — R0602 Shortness of breath: Secondary | ICD-10-CM | POA: Diagnosis not present

## 2018-06-06 ENCOUNTER — Other Ambulatory Visit: Payer: Self-pay | Admitting: Cardiovascular Disease

## 2018-06-24 ENCOUNTER — Telehealth: Payer: Self-pay | Admitting: Cardiovascular Disease

## 2018-06-24 NOTE — Telephone Encounter (Signed)
Pt c/o BP issue: STAT if pt c/o blurred vision, one-sided weakness or slurred speech  1. What are your last 5 BP readings?   Saturday 89/33  Sunday - 154/50something This morning - 124/60 2. Are you having any other symptoms (ex. Dizziness, headache, blurred vision, passed out)? dull headach when it was low  3. What is your BP issue? Too low

## 2018-06-25 NOTE — Telephone Encounter (Signed)
Spoke to patient.   She reports isolated episode of hypotension. 89/33. She reported having dull headache but no chest pain or SOB.   She feels that she is getting adequate amount of fluid.  I suggested that she sit on the side of the med and move slowly. If she is feeling weakness or dizzy she should lay in bed and elevate feet.   She has not had low reading since. Holding hydralazine since.   BP last night 123/60 HR 66. BP today 141/64, HR mid 60s.  Pt feels well today and wanted advise on how to proceed with medications. I told her to continue with current plan and we will call back with any advice received from Dr. Rockey Situ.  Routed to provider to advise.   Advised pt to call for any further questions or concerns.

## 2018-06-27 NOTE — Telephone Encounter (Signed)
Called to give pt Dr.Gollan's recommendation x2. Unable to lmom. Pt phone rings out then disconnects.

## 2018-06-27 NOTE — Telephone Encounter (Signed)
latest numbers are good, No med changes Stay hydrated, watch out for hot sun/getting overheated

## 2018-07-04 NOTE — Telephone Encounter (Signed)
Spoke with patients husband and reviewed Dr. Donivan Scull recommendations. He states that she is currently over seeing her doctor at [facility]. He will pass on the recommendations and had no further questions at this time.

## 2018-07-04 NOTE — Telephone Encounter (Signed)
Called home number listed and spouse answer. Patient is not home.  Called cell number and left message to call back.

## 2018-09-22 IMAGING — CT CT HEAD W/O CM
3 of 4 series · 15 of 47 positions shown, 18 images · non-contrast
Comparison: None.

CLINICAL DATA: Hypertension and weakness.  Nausea.

EXAM:
CT HEAD WITHOUT CONTRAST
TECHNIQUE: Contiguous axial images were obtained from the base of the skull
through the vertex without intravenous contrast.

[Series 3: head wo · axial · 0.44mm/px · z∈[-58,+62]mm · 9 of 30 slices shown, 12 images]
[im 3/30  brain]
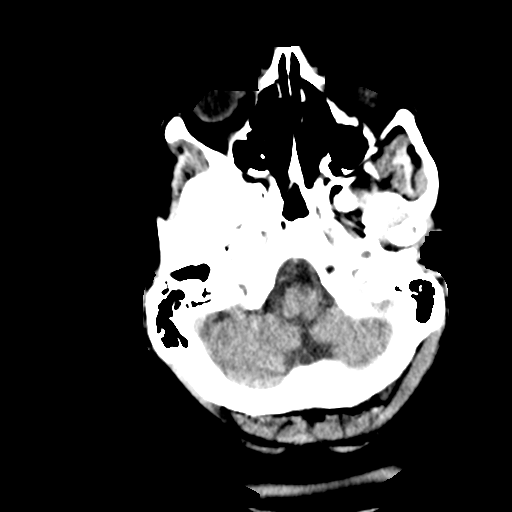
[im 3/30  bone]
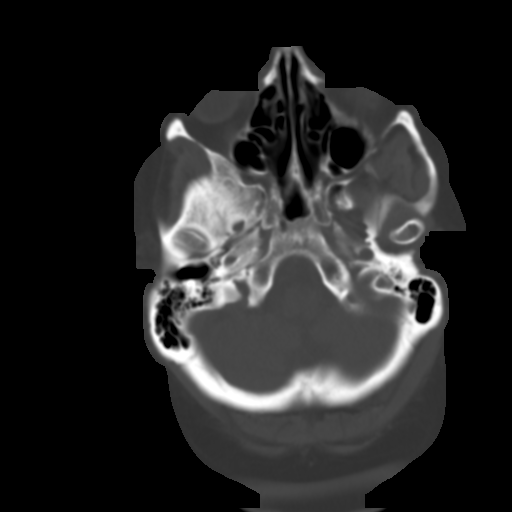
[im 7/30  brain]
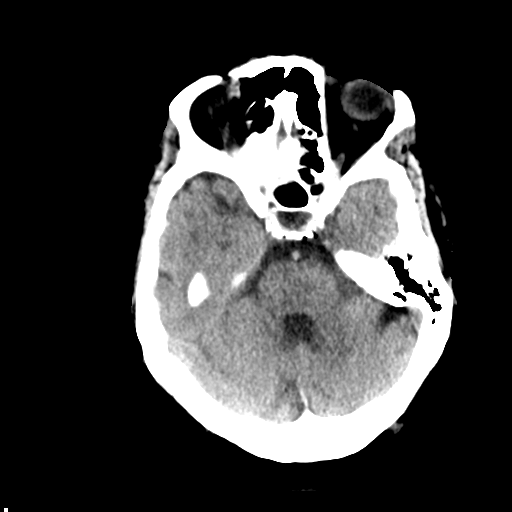
[im 9/30  brain]
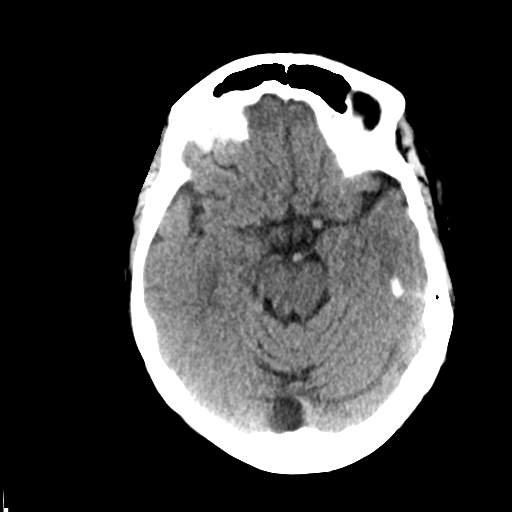
[im 13/30  brain]
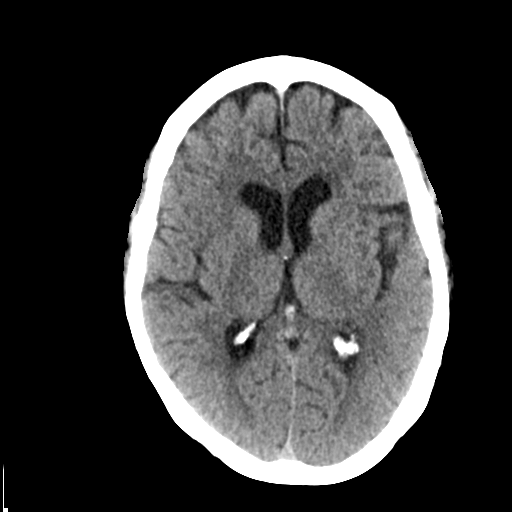
[im 15/30  brain]
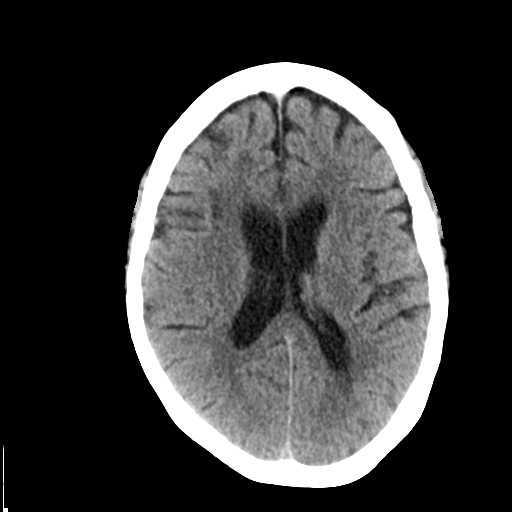
[im 15/30  bone]
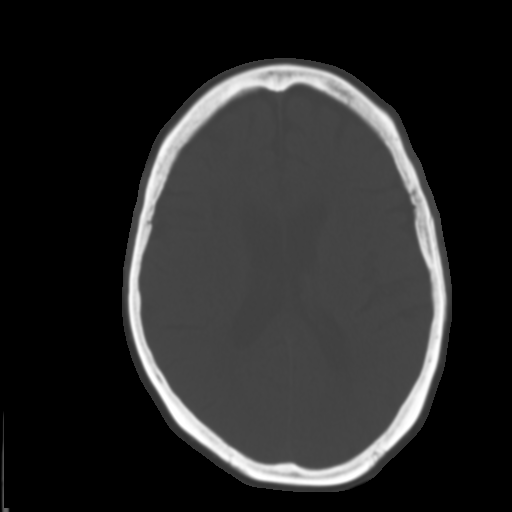
[im 17/30  brain]
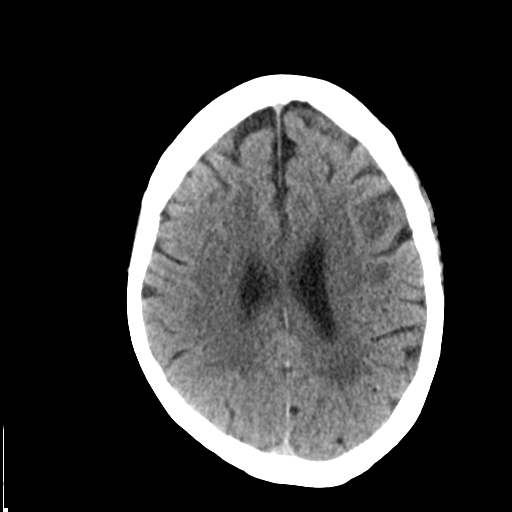
[im 21/30  brain]
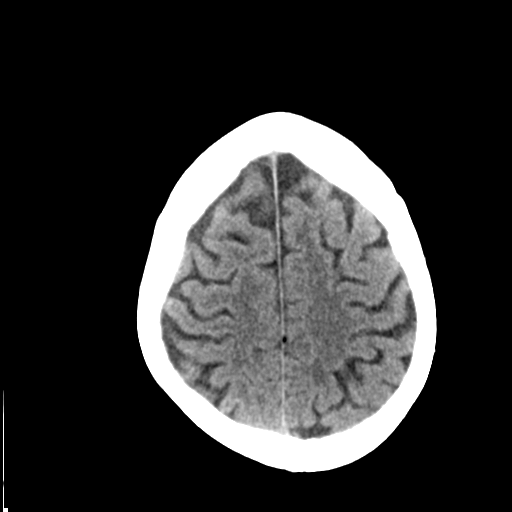
[im 23/30  brain]
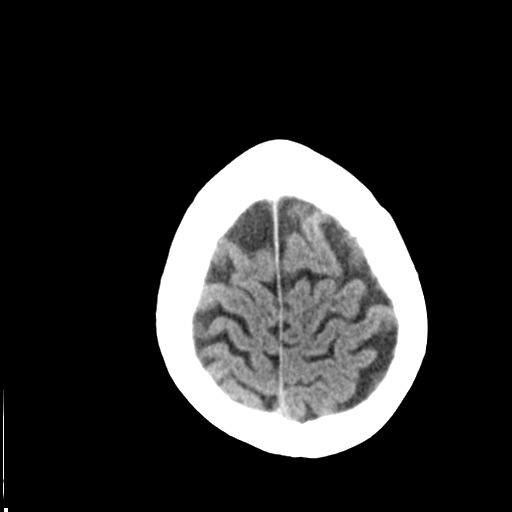
[im 27/30  brain]
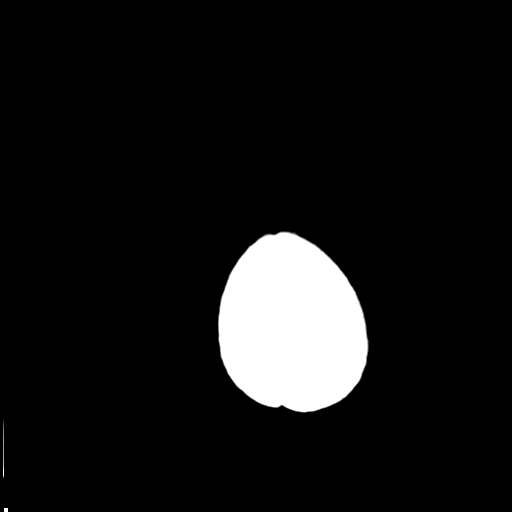
[im 27/30  bone]
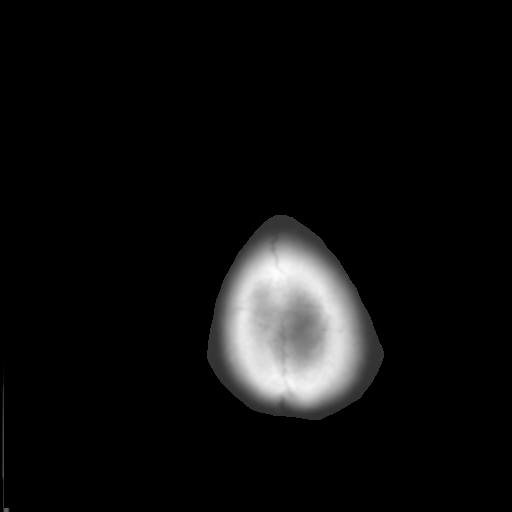

[Series 4: coronal soft tissue · coronal · 0.29mm/px · 3 of 64 slices shown]
[im 22/64  brain]
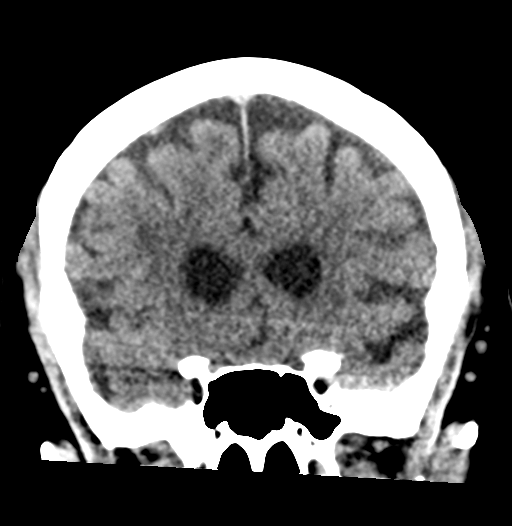
[im 29/64  brain]
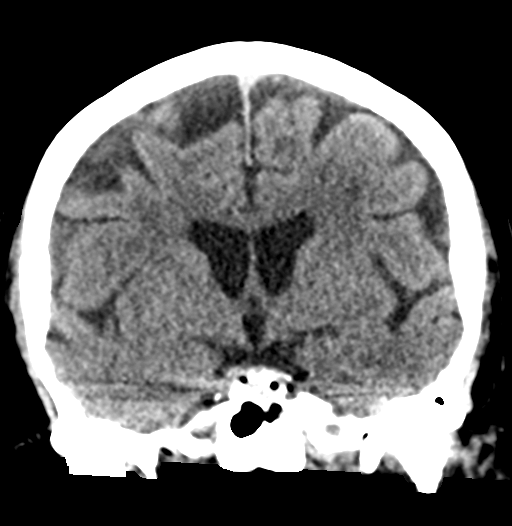
[im 36/64  brain]
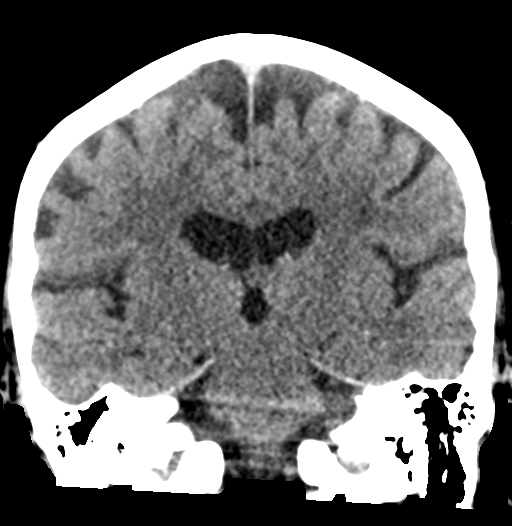

[Series 5: sagittal soft tissue · sagittal · 0.28mm/px · 3 of 50 slices shown]
[im 17/50  brain]
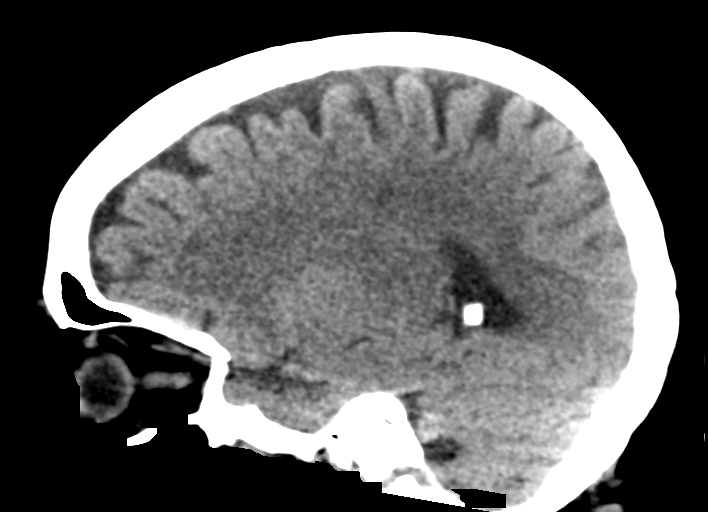
[im 25/50  brain]
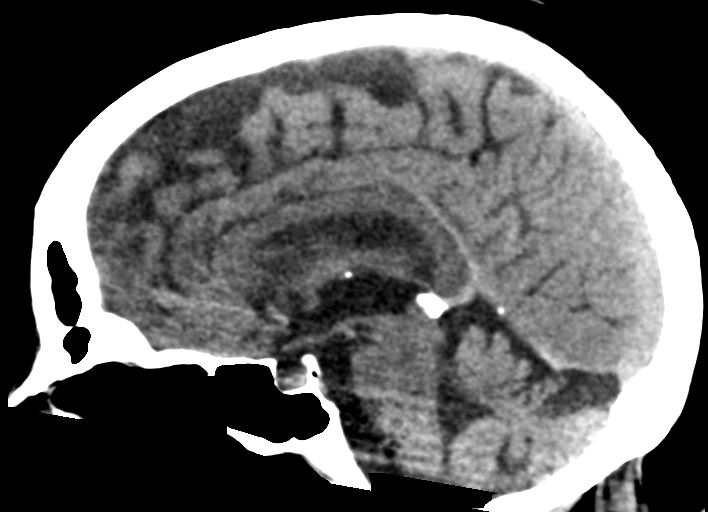
[im 33/50  brain]
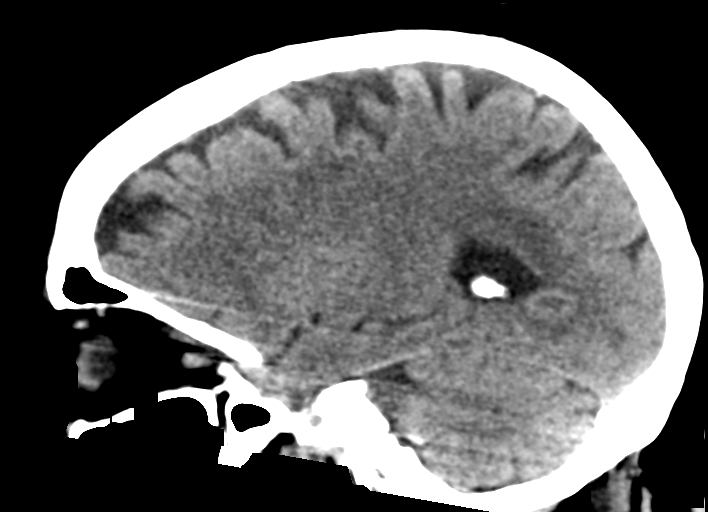

[15 of 47 positions shown; findings below may reference images not displayed]

FINDINGS: Brain:

There is no intracranial hemorrhage, mass or evidence of acute
infarction. There is mild generalized atrophy. There is mild chronic
microvascular ischemic change. There is no significant extra-axial
fluid collection.

No acute intracranial findings are evident.

Vascular: No hyperdense vessel or unexpected calcification.

Skull: Normal. Negative for fracture or focal lesion.

Sinuses/Orbits: No acute finding.

Other: None.
IMPRESSION: No acute intracranial findings. There is mild generalized atrophy
and chronic appearing white matter hypodensities which likely
represent small vessel ischemic disease.

## 2018-09-22 IMAGING — CT CT ABD-PELV W/O CM
2 of 4 series · 16 of 46 positions shown, 18 images · non-contrast
Comparison: CTA 10/06/2010

CLINICAL DATA: Nausea, and diarrhea.  Lower abdominal discomfort.

EXAM:
CT ABDOMEN AND PELVIS WITHOUT CONTRAST
TECHNIQUE: Multidetector CT imaging of the abdomen and pelvis was performed
following the standard protocol without IV contrast.

[Series 2: routine abd/pel wo · axial · 0.86mm/px · z∈[-819,-364]mm · 13 of 101 slices shown, 15 images]
[im 5/101  soft-tissue]
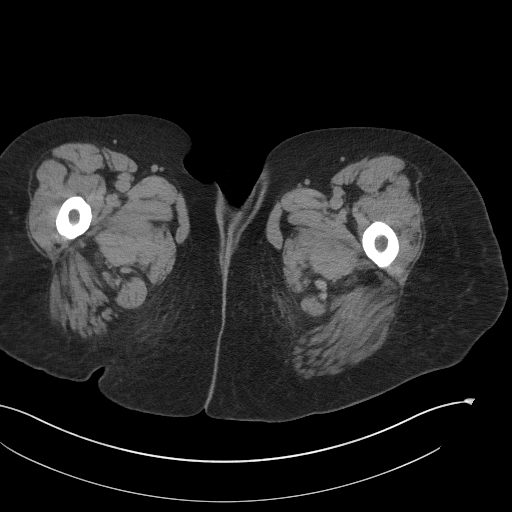
[im 5/101  bone]
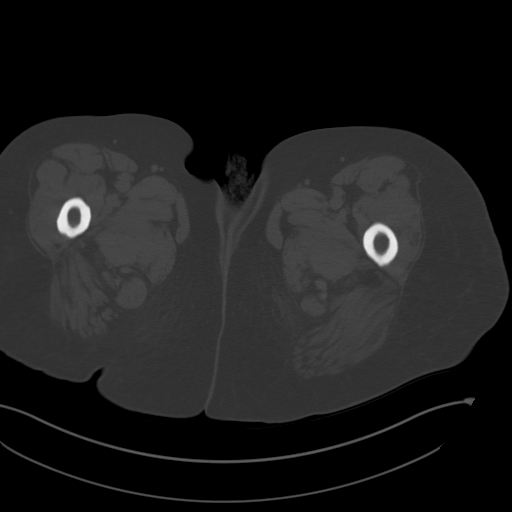
[im 13/101  soft-tissue]
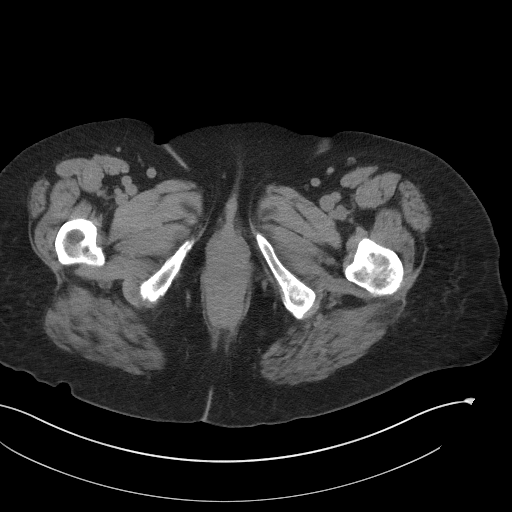
[im 21/101  soft-tissue]
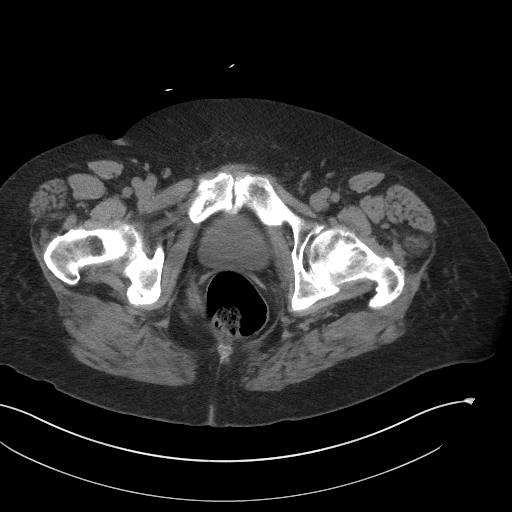
[im 30/101  soft-tissue]
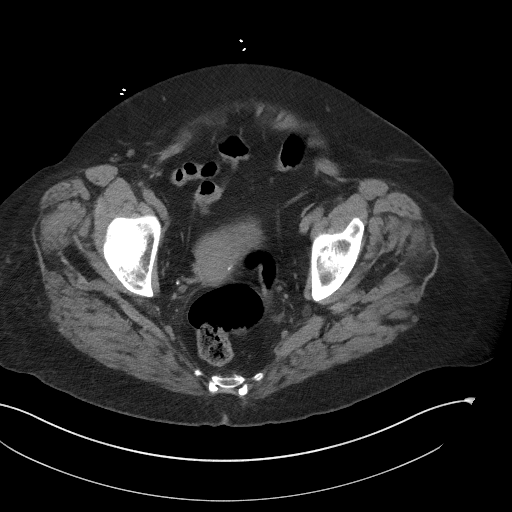
[im 34/101  soft-tissue]
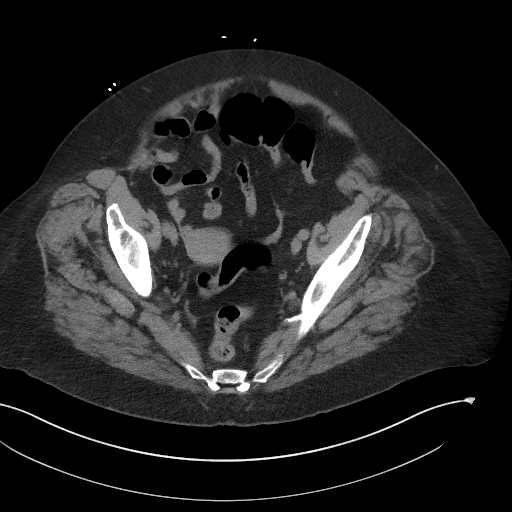
[im 42/101  soft-tissue]
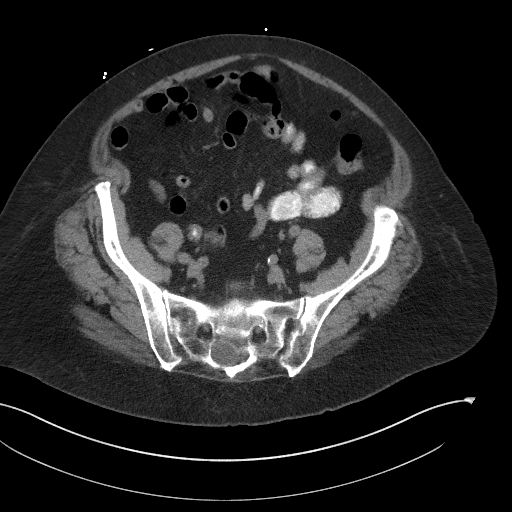
[im 51/101  soft-tissue]
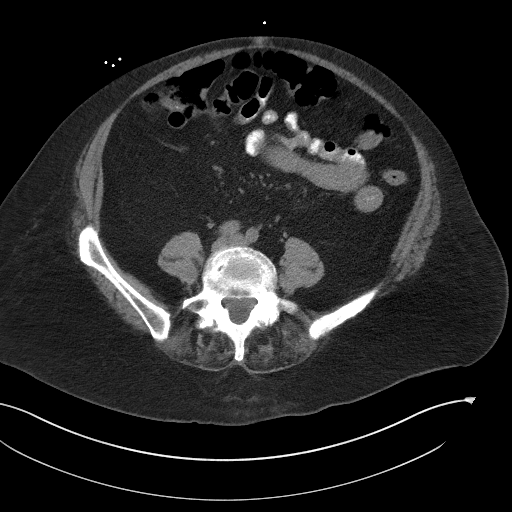
[im 59/101  soft-tissue]
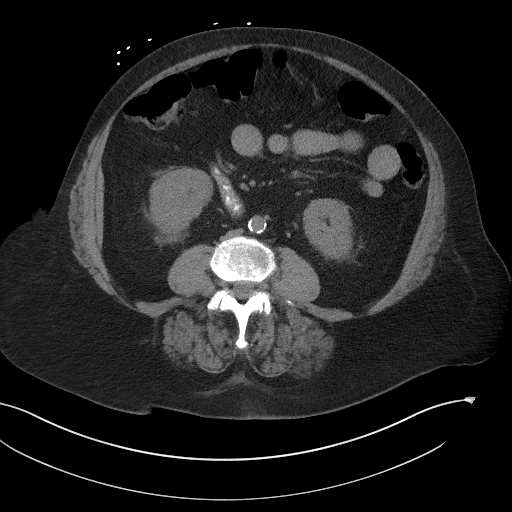
[im 67/101  soft-tissue]
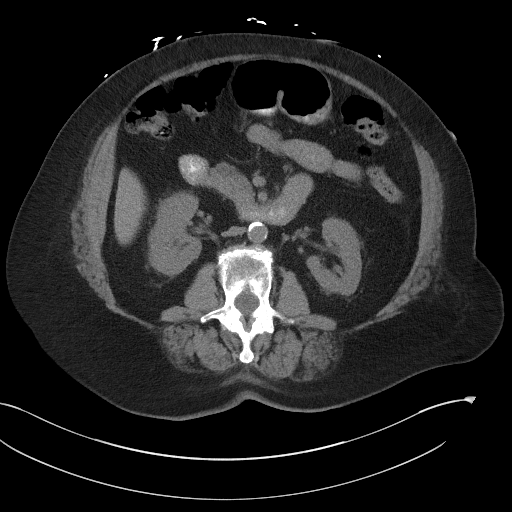
[im 67/101  bone]
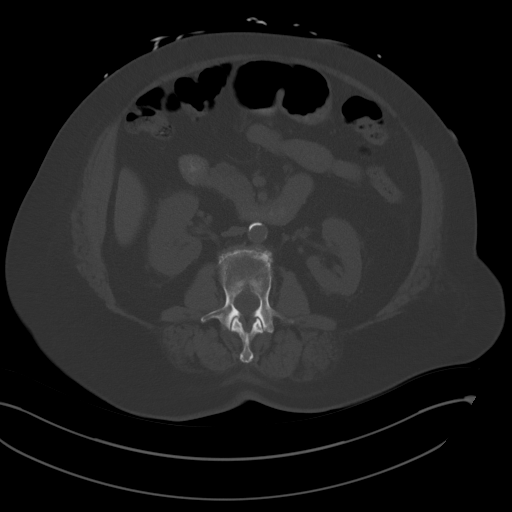
[im 71/101  soft-tissue]
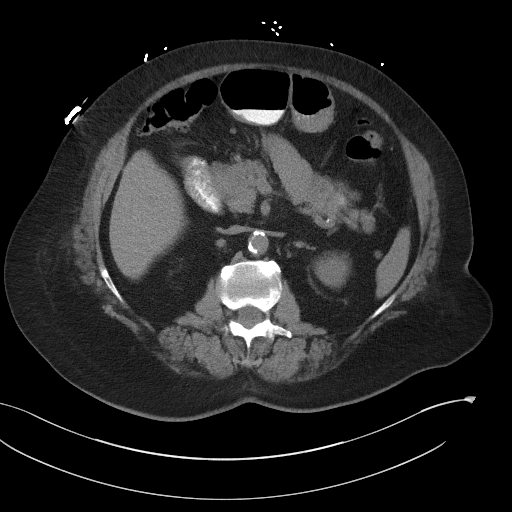
[im 80/101  soft-tissue]
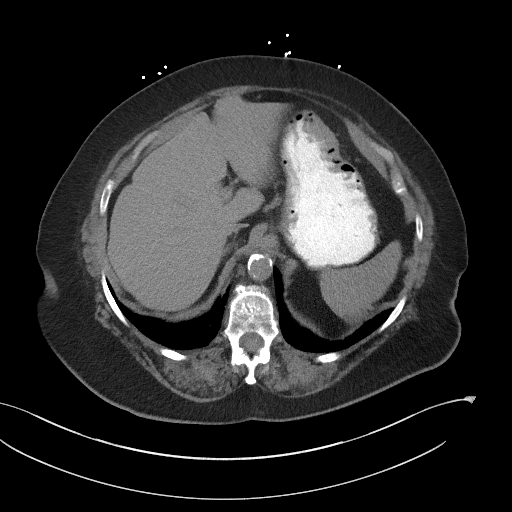
[im 88/101  soft-tissue]
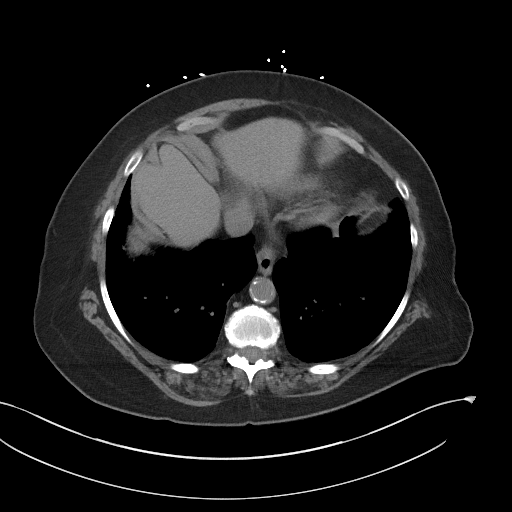
[im 96/101  soft-tissue]
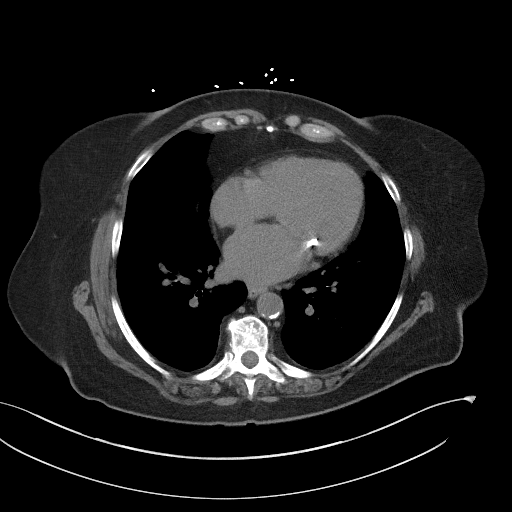

[Series 5: coronal st · coronal · 0.87mm/px · 3 of 120 slices shown]
[im 40/120  soft-tissue]
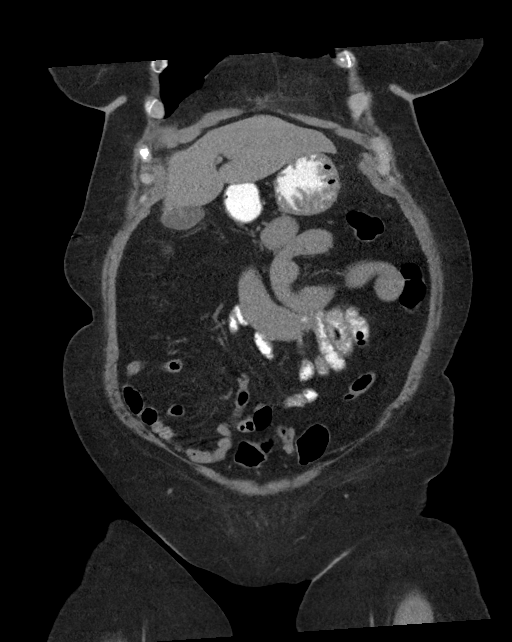
[im 53/120  soft-tissue]
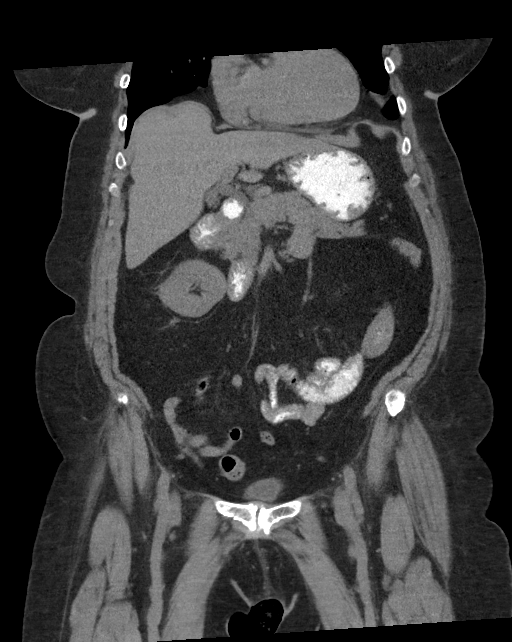
[im 67/120  soft-tissue]
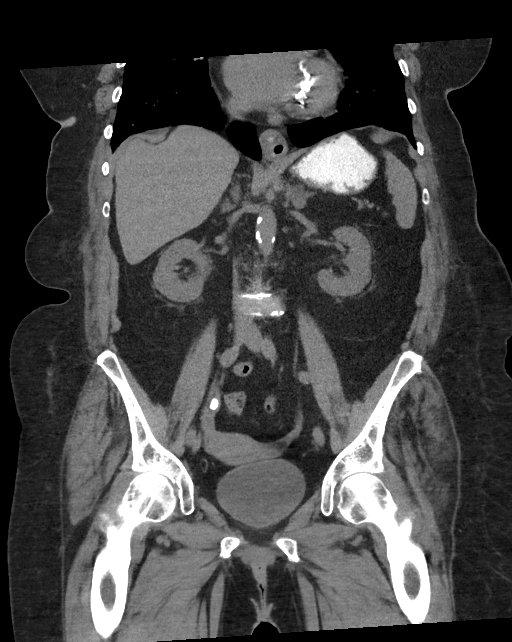

[16 of 46 positions shown; findings below may reference images not displayed]

FINDINGS: Lower chest: Minimal smooth septal thickening in the lung bases
suggesting pulmonary edema. Heart is normal in size. There are
mitral annulus calcifications.

Hepatobiliary: No focal lesion allowing for lack contrast. Possible
tiny gallstone within physiologically distended gallbladder. No
biliary dilatation.

Pancreas: No ductal dilatation or inflammation.

Spleen: Normal in size without focal abnormality.

Adrenals/Urinary Tract: No adrenal nodule. No hydronephrosis. There
is an 8 mm fat density lesion in the mid right kidney, likely a
small angiomyolipoma. Mild nonspecific perinephric stranding.
Urinary bladder is physiologically distended. No bladder wall
thickening.

Stomach/Bowel: Small hiatal hernia. Enteric contrast of distal
esophagus can be seen with reflux or slow transit. Stomach is
physiologically distended with ingested contrast. No evidence of
bowel wall thickening, distention or inflammation. Normal appendix.
Minimal sigmoid colonic diverticulosis without inflammation. No
colonic wall thickening.

Vascular/Lymphatic: Aortic atherosclerosis. No enlarged abdominal or
pelvic lymph nodes.

Reproductive: Uterus and bilateral adnexa are unremarkable.
Questionable phlebolith versus right ovarian calcification is
unchanged.

Other: No free air, free fluid, or intra-abdominal fluid collection.
Tiny fat containing umbilical hernia.

Musculoskeletal: Degenerative anterolisthesis of L4 on L5.
Multilevel degenerative change throughout the lumbar spine. There
are no acute or suspicious osseous abnormalities.
IMPRESSION: 1. No explanation for diarrhea or lower abdominal discomfort.
Minimal colonic diverticulosis without acute diverticulitis or
colonic inflammation.
2. Small hiatal hernia containing enteric contrast, can be seen with
reflux or delayed transit.
3. Possible tiny gallstone.
4. Suspect mild pulmonary edema at the lung bases.
5. Aortic atherosclerosis without aneurysm.

## 2018-10-11 ENCOUNTER — Other Ambulatory Visit: Payer: Self-pay | Admitting: Cardiovascular Disease

## 2018-11-07 ENCOUNTER — Ambulatory Visit
Admission: RE | Admit: 2018-11-07 | Discharge: 2018-11-07 | Disposition: A | Discharge status: Home / Self Care | Payer: Medicare Other | Source: Ambulatory Visit | Attending: Internal Medicine | Admitting: Internal Medicine

## 2018-11-07 DIAGNOSIS — Z1231 Encounter for screening mammogram for malignant neoplasm of breast: Secondary | ICD-10-CM | POA: Diagnosis present

## 2019-02-17 ENCOUNTER — Other Ambulatory Visit: Payer: Self-pay | Admitting: Cardiovascular Disease

## 2019-05-31 ENCOUNTER — Other Ambulatory Visit: Payer: Self-pay | Admitting: Cardiovascular Disease

## 2019-06-03 NOTE — Telephone Encounter (Signed)
Going to see if PCP can prescribe medication because of a bad experience trying to get in the building last time. Will call back later

## 2019-06-03 NOTE — Telephone Encounter (Signed)
Please schedule appointment with Dr. Gollan for refills. Thank you! 

## 2019-10-04 ENCOUNTER — Emergency Department
Admission: EM | Admit: 2019-10-04 | Discharge: 2019-10-04 | Disposition: A | Discharge status: Home / Self Care | Payer: Medicare PPO | Attending: Emergency Medicine | Admitting: Emergency Medicine

## 2019-10-04 ENCOUNTER — Encounter: Payer: Self-pay | Admitting: Emergency Medicine

## 2019-10-04 ENCOUNTER — Other Ambulatory Visit: Payer: Self-pay

## 2019-10-04 DIAGNOSIS — Z20822 Contact with and (suspected) exposure to covid-19: Secondary | ICD-10-CM | POA: Insufficient documentation

## 2019-10-04 DIAGNOSIS — E119 Type 2 diabetes mellitus without complications: Secondary | ICD-10-CM | POA: Diagnosis not present

## 2019-10-04 DIAGNOSIS — J449 Chronic obstructive pulmonary disease, unspecified: Secondary | ICD-10-CM | POA: Diagnosis not present

## 2019-10-04 DIAGNOSIS — E860 Dehydration: Secondary | ICD-10-CM | POA: Diagnosis not present

## 2019-10-04 DIAGNOSIS — Z7982 Long term (current) use of aspirin: Secondary | ICD-10-CM | POA: Diagnosis not present

## 2019-10-04 DIAGNOSIS — Z7984 Long term (current) use of oral hypoglycemic drugs: Secondary | ICD-10-CM | POA: Insufficient documentation

## 2019-10-04 DIAGNOSIS — J45909 Unspecified asthma, uncomplicated: Secondary | ICD-10-CM | POA: Insufficient documentation

## 2019-10-04 DIAGNOSIS — Z7951 Long term (current) use of inhaled steroids: Secondary | ICD-10-CM | POA: Diagnosis not present

## 2019-10-04 DIAGNOSIS — R110 Nausea: Secondary | ICD-10-CM

## 2019-10-04 DIAGNOSIS — R112 Nausea with vomiting, unspecified: Secondary | ICD-10-CM | POA: Diagnosis not present

## 2019-10-04 DIAGNOSIS — R1084 Generalized abdominal pain: Secondary | ICD-10-CM | POA: Insufficient documentation

## 2019-10-04 DIAGNOSIS — M6281 Muscle weakness (generalized): Secondary | ICD-10-CM | POA: Diagnosis not present

## 2019-10-04 LAB — CBC WITH DIFFERENTIAL/PLATELET
Abs Immature Granulocytes: 0.03 10*3/uL (ref 0.00–0.07)
Basophils Absolute: 0 10*3/uL (ref 0.0–0.1)
Basophils Relative: 0 %
Eosinophils Absolute: 0 10*3/uL (ref 0.0–0.5)
Eosinophils Relative: 0 %
HCT: 36.1 % (ref 36.0–46.0)
Hemoglobin: 12.8 g/dL (ref 12.0–15.0)
Immature Granulocytes: 0 %
Lymphocytes Relative: 13 %
Lymphs Abs: 1.1 10*3/uL (ref 0.7–4.0)
MCH: 28.4 pg (ref 26.0–34.0)
MCHC: 35.5 g/dL (ref 30.0–36.0)
MCV: 80.2 fL (ref 80.0–100.0)
Monocytes Absolute: 0.4 10*3/uL (ref 0.1–1.0)
Monocytes Relative: 5 %
Neutro Abs: 6.7 10*3/uL (ref 1.7–7.7)
Neutrophils Relative %: 82 %
Platelets: 289 10*3/uL (ref 150–400)
RBC: 4.5 MIL/uL (ref 3.87–5.11)
RDW: 14.6 % (ref 11.5–15.5)
WBC: 8.3 10*3/uL (ref 4.0–10.5)
nRBC: 0 % (ref 0.0–0.2)

## 2019-10-04 LAB — COMPREHENSIVE METABOLIC PANEL
ALT: 19 U/L (ref 0–44)
AST: 24 U/L (ref 15–41)
Albumin: 3.8 g/dL (ref 3.5–5.0)
Alkaline Phosphatase: 59 U/L (ref 38–126)
Anion gap: 12 (ref 5–15)
BUN: 19 mg/dL (ref 8–23)
CO2: 24 mmol/L (ref 22–32)
Calcium: 9.2 mg/dL (ref 8.9–10.3)
Chloride: 95 mmol/L — ABNORMAL LOW (ref 98–111)
Creatinine, Ser: 0.67 mg/dL (ref 0.44–1.00)
GFR calc Af Amer: >60 mL/min (ref >60)
GFR calc non Af Amer: >60 mL/min (ref >60)
Glucose, Bld: 170 mg/dL — ABNORMAL HIGH (ref 70–99)
Potassium: 3.6 mmol/L (ref 3.5–5.1)
Sodium: 131 mmol/L — ABNORMAL LOW (ref 135–145)
Total Bilirubin: 0.6 mg/dL (ref 0.3–1.2)
Total Protein: 7.1 g/dL (ref 6.5–8.1)

## 2019-10-04 LAB — RESPIRATORY PANEL BY RT PCR (FLU A&B, COVID)
Influenza A by PCR: NEGATIVE
Influenza B by PCR: NEGATIVE
SARS Coronavirus 2 by RT PCR: NEGATIVE

## 2019-10-04 LAB — URINALYSIS, COMPLETE (UACMP) WITH MICROSCOPIC
Bacteria, UA: NONE SEEN
Bilirubin Urine: NEGATIVE
Glucose, UA: 50 mg/dL — AB
Hgb urine dipstick: NEGATIVE
Ketones, ur: 5 mg/dL — AB
Leukocytes,Ua: NEGATIVE
Nitrite: NEGATIVE
Protein, ur: 30 mg/dL — AB
Specific Gravity, Urine: 1.014 (ref 1.005–1.030)
pH: 7 (ref 5.0–8.0)

## 2019-10-04 LAB — LIPASE, BLOOD: Lipase: 33 U/L (ref 11–51)

## 2019-10-04 LAB — TROPONIN I (HIGH SENSITIVITY): Troponin I (High Sensitivity): 16 ng/L (ref <18)

## 2019-10-04 MED ORDER — SODIUM CHLORIDE 0.9 % IV BOLUS
1000.0000 mL | Freq: Once | INTRAVENOUS | Status: AC
  Administered 2019-10-04: 19:00:00 1000 mL via INTRAVENOUS

## 2019-10-04 MED ORDER — FAMOTIDINE 20 MG PO TABS: 20.0000 mg | ORAL_TABLET | Freq: Two times a day (BID) | ORAL | 0 refills | Status: DC

## 2019-10-04 MED ORDER — HYDRALAZINE HCL 50 MG PO TABS
25.0000 mg | ORAL_TABLET | ORAL | Status: AC
  Administered 2019-10-04: 25 mg via ORAL
  Filled 2019-10-04: qty 1

## 2019-10-04 MED ORDER — CARVEDILOL 6.25 MG PO TABS
6.2500 mg | ORAL_TABLET | ORAL | Status: AC
  Administered 2019-10-04: 6.25 mg via ORAL
  Filled 2019-10-04: qty 1

## 2019-10-04 MED ORDER — ONDANSETRON 4 MG PO TBDP: 4.0000 mg | ORAL_TABLET | Freq: Three times a day (TID) | ORAL | 0 refills | Status: DC | PRN

## 2019-10-04 MED ORDER — ONDANSETRON HCL 4 MG/2ML IJ SOLN
4.0000 mg | Freq: Once | INTRAMUSCULAR | Status: AC
  Administered 2019-10-04: 19:00:00 4 mg via INTRAVENOUS
  Filled 2019-10-04: qty 2

## 2019-10-04 MED ORDER — ONDANSETRON 4 MG PO TBDP
8.0000 mg | ORAL_TABLET | Freq: Once | ORAL | Status: AC
  Administered 2019-10-04: 8 mg via ORAL
  Filled 2019-10-04: qty 2

## 2019-10-04 NOTE — ED Notes (Signed)
MD made aware of blood Pressure

## 2019-10-04 NOTE — ED Provider Notes (Signed)
[facility] Emergency Department Provider Note  ____________________________________________  Time seen: Approximately 7:05 PM  I have reviewed the triage vital signs and the nursing notes.   HISTORY  Chief Complaint Abdominal Pain (Onset 5am this morning, associated with N/V, with generaliaed weakness)    HPI Amber Welch is a 85 y.o. female with a history of COPD morbid obesity diabetes who comes the ED complaining of generalized weakness, generalized abdominal pain, nausea and vomiting that start about 5 AM this morning.  Feels like she often feels with hyponatremia.  States it.  She feels sluggish.  Also dizzy with standing.  Denies any diuretic use.  She recently had a tooth extraction and did have to be n.p.o. for a day because of that.      Past Medical History:  Diagnosis Date  . Anxiety   . Arthritis    Back and knees  . Asthma    a. chronic SOB  . Carotid artery disease ([place])    a. carotid doppler 2016: 67% RICA, <89% LICA  . COPD (chronic obstructive pulmonary disease) ([place])   . Depression   . Diabetes type 2, controlled ([place])   . Hyperlipidemia   . Insomnia   . Labile hypertension    a. when she is stressed BP will increase to the 381'O systolic  . Medication intolerance   . Morbid obesity ([place])   . Normal coronary arteries    a. normal cath 2003; b. normal Myoview in 2008 & 2009  . OSA (obstructive sleep apnea)    a. BiPAP  . Physical deconditioning      Patient Active Problem List   Diagnosis Date Noted  . Hyponatremia 02/04/2016  . History of smoking 07/16/2015  . Morbid obesity ([place])   . Normal coronary arteries   . OSA (obstructive sleep apnea)   . Asthma   . Physical deconditioning   . Medication intolerance   . Anxiety   . Depression   . Insomnia   . COPD (chronic obstructive pulmonary disease) ([place]) 01/08/2015  . Labile blood pressure 05/11/2011  . Hyperlipidemia 05/11/2011  . Carotid artery stenosis  01/26/2010  . SHORTNESS OF BREATH 01/26/2010  . CHEST TIGHTNESS-PRESSURE-OTHER 01/26/2010     Past Surgical History:  Procedure Laterality Date  . COLONOSCOPY WITH PROPOFOL N/A 07/31/2014   Procedure: COLONOSCOPY WITH PROPOFOL;  Surgeon: Hulen Luster, MD;  Location: [facility] ENDOSCOPY;  Service: Gastroenterology;  Laterality: N/A;  . DILATION AND CURETTAGE OF UTERUS    . FOOT SURGERY     Left foot, bunionectomy and hammer toe  . JOINT REPLACEMENT  06/05/11   Right knee replaced  . JOINT REPLACEMENT  09/20/11   Left knee  . right foot surgery Right April 2015   Right Foot and Ankle  . right knee surgery     arthroscopy for torn meniscus     Prior to Admission medications   Medication Sig Start Date End Date Taking? Authorizing Provider  meclizine (ANTIVERT) 25 MG tablet Take 25 mg by mouth 3 (three) times daily as needed for dizziness.   Yes [provider]  albuterol (VENTOLIN HFA) 108 (90 Base) MCG/ACT inhaler Inhale 2 puffs into the lungs every 4 (four) hours as needed for wheezing. UAD 01/08/15   Flora Lipps, MD  ALPRAZolam Duanne Moron) 0.5 MG tablet Take 0.25-0.5 mg by mouth at bedtime as needed for sleep.    [provider]  aspirin 81 MG tablet Take 81 mg by mouth daily.  [provider]  Azelastine HCl (ASTEPRO NA) Place 1 spray into the nose at bedtime as needed (congestion).     [provider]  Azilsartan Medoxomil (EDARBI) 80 MG TABS Take 1 tablet by mouth daily.     [provider]  carvedilol (COREG) 6.25 MG tablet TAKE 1 TABLET BY MOUTH TWICE A DAY 02/17/19   Minna Merritts, MD  docusate sodium (COLACE) 100 MG capsule Take 100 mg by mouth 2 (two) times daily.     [provider]  esomeprazole (NEXIUM) 40 MG capsule Take 40 mg by mouth daily before breakfast.      [provider]  famotidine (PEPCID) 20 MG tablet Take 1 tablet (20 mg total) by mouth 2 (two) times daily. 10/04/19   Carrie Mew, MD  ferrous  gluconate (FERGON) 324 MG tablet Take by mouth daily. 01/31/18 01/31/19  [provider]  gabapentin (NEURONTIN) 100 MG capsule Take 200 mg by mouth at bedtime.  07/01/11   [provider]  gabapentin (NEURONTIN) 300 MG capsule Take 2 capsules by mouth at bedtime.  07/24/16 02/12/18  [provider]  hydrALAZINE (APRESOLINE) 25 MG tablet Take 25 mg by mouth 2 (two) times daily.    [provider]  LORazepam (ATIVAN) 1 MG tablet Take 1 mg by mouth at bedtime.    [provider]  magnesium gluconate (MAGONATE) 500 MG tablet Take 500 mg by mouth daily.    [provider]  Melatonin 5 MG CAPS Take 1 capsule by mouth at bedtime as needed (sleep).     [provider]  metFORMIN (GLUCOPHAGE) 500 MG tablet Take 500 mg by mouth daily.      [provider]  mometasone (NASONEX) 50 MCG/ACT nasal spray Place 2 sprays into the nose daily as needed (allergies).     [provider]  ondansetron (ZOFRAN ODT) 4 MG disintegrating tablet Take 1 tablet (4 mg total) by mouth every 8 (eight) hours as needed for nausea or vomiting. 10/04/19   Carrie Mew, MD  pravastatin (PRAVACHOL) 20 MG tablet Take 20 mg by mouth daily.  01/31/18   [provider]     Allergies Penicillins, Ace inhibitors, Celebrex [celecoxib], Statins, Iodinated diagnostic agents, Norpramin [desipramine], Phenobarbital, and Sinequan [doxepin]   Family History  Problem Relation Age of Onset  . Heart disease Father        Heart Disease before age 69  . Diabetes Father   . Hyperlipidemia Father   . Heart attack Father   . Heart disease Mother   . Hypertension Mother   . Heart attack Mother   . Breast cancer Cousin 55    Social History Social History   Tobacco Use  . Smoking status: Never Smoker  . Smokeless tobacco: Never Used  . Tobacco comment: tobacco use - no  Substance Use Topics  . Alcohol use: No    Alcohol/week: 0.0 standard drinks  .  Drug use: No    Review of Systems  Constitutional:   No fever or chills.  ENT:   No sore throat. No rhinorrhea. Cardiovascular:   No chest pain or syncope. Respiratory:   No dyspnea or cough. Gastrointestinal:   Positive as above for abdominal pain nausea and vomiting. Musculoskeletal:   Negative for focal pain or swelling All other systems reviewed and are negative except as documented above in ROS and HPI.  ____________________________________________   PHYSICAL EXAM:  VITAL SIGNS: ED Triage Vitals  Enc Vitals Group  BP 10/04/19 1902 (!) 176/72     Pulse Rate 10/04/19 1902 75     Resp 10/04/19 1904 18     Temp 10/04/19 1902 98 F (36.7 C)     Temp Source 10/04/19 1902 Oral     SpO2 10/04/19 1902 96 %     Weight 10/04/19 1904 226 lb (102.5 kg)     Height 10/04/19 1904 5\' 4"  (1.626 m)     Head Circumference --      Peak Flow --      Pain Score 10/04/19 1903 0     Pain Loc --      Pain Edu? --      Excl. in [place]? --     Vital signs reviewed, nursing assessments reviewed.   Constitutional:   Alert and oriented. Non-toxic appearance. Eyes:   Conjunctivae are normal. EOMI. PERRL. ENT      Head:   Normocephalic and atraumatic.      Nose: Normal      Mouth/Throat:   Dry mucous membranes      Neck:   No meningismus. Full ROM. Hematological/Lymphatic/Immunilogical:   No cervical lymphadenopathy. Cardiovascular:   RRR. Symmetric bilateral radial and DP pulses.  No murmurs. Cap refill less than 2 seconds. Respiratory:   Normal respiratory effort without tachypnea/retractions. Breath sounds are clear and equal bilaterally. No wheezes/rales/rhonchi. Gastrointestinal:   Soft and nontender. Non distended. There is no CVA tenderness.  No rebound, rigidity, or guarding.  Musculoskeletal:   Normal range of motion in all extremities. No joint effusions.  No lower extremity tenderness.  No edema. Neurologic:   Normal speech and language.  Motor grossly intact. No acute focal  neurologic deficits are appreciated.  Skin:    Skin is warm, dry and intact. No rash noted.  No petechiae, purpura, or bullae.  ____________________________________________    LABS (pertinent positives/negatives) (all labs ordered are listed, but only abnormal results are displayed) Labs Reviewed  COMPREHENSIVE METABOLIC PANEL - Abnormal; Notable for the following components:      Result Value   Sodium 131 (*)    Chloride 95 (*)    Glucose, Bld 170 (*)    All other components within normal limits  URINALYSIS, COMPLETE (UACMP) WITH MICROSCOPIC - Abnormal; Notable for the following components:   Color, Urine YELLOW (*)    APPearance CLEAR (*)    Glucose, UA 50 (*)    Ketones, ur 5 (*)    Protein, ur 30 (*)    All other components within normal limits  RESPIRATORY PANEL BY RT PCR (FLU A&B, COVID)  LIPASE, BLOOD  CBC WITH DIFFERENTIAL/PLATELET  TROPONIN I (HIGH SENSITIVITY)  TROPONIN I (HIGH SENSITIVITY)   ____________________________________________   EKG Interpreted by me Sinus rhythm rate of 75, normal axis and intervals.  Normal QRS ST segments and T waves.   ____________________________________________    RADIOLOGY  No results found.  ____________________________________________   PROCEDURES Procedures  ____________________________________________  DIFFERENTIAL DIAGNOSIS   Hyponatremia, dehydration, AKI, UTI, Covid  CLINICAL IMPRESSION / ASSESSMENT AND PLAN / ED COURSE  Medications ordered in the ED: Medications  hydrALAZINE (APRESOLINE) tablet 25 mg (has no administration in time range)  carvedilol (COREG) tablet 6.25 mg (has no administration in time range)  ondansetron (ZOFRAN-ODT) disintegrating tablet 8 mg (has no administration in time range)  sodium chloride 0.9 % bolus 1,000 mL (0 mLs Intravenous Stopped 10/04/19 2205)  ondansetron (ZOFRAN) injection 4 mg (4 mg Intravenous Given 10/04/19 1921)    Pertinent  labs & imaging results that were  available during my care of the patient were reviewed by me and considered in my medical decision making (see chart for details).  Amber Welch was evaluated in Emergency Department on 10/04/2019 for the symptoms described in the history of present illness. She was evaluated in the context of the global COVID-19 pandemic, which necessitated consideration that the patient might be at risk for infection with the SARS-CoV-2 virus that causes COVID-19. Institutional protocols and algorithms that pertain to the evaluation of patients at risk for COVID-19 are in a state of rapid change based on information released by regulatory bodies including the CDC and federal and state organizations. These policies and algorithms were followed during the patient's care in the ED.   Patient presents clinically dehydrated with generalized weakness nausea vomiting.  Exam is benign, vital signs unremarkable.  Will check labs Covid test chest x-ray, give IV saline for hydration.  IV Zofran 4 mg for nausea.  Clinical Course as of Oct 04 2231  Sat Oct 04, 2019  2035 Labs all reassuring. Awaiting covid test result.    [PS]    Clinical Course User Index [PS] Carrie Mew, MD     ----------------------------------------- 10:33 PM on 10/04/2019 -----------------------------------------  Covid negative.  Patient feeling better and tolerating oral intake.  Vitals remain stable with severe hypertension.  She is due for carvedilol and hydralazine which we will give here prior to discharge.   Considering the patient's symptoms, medical history, and physical examination today, I have low suspicion for cholecystitis or biliary pathology, pancreatitis, perforation or bowel obstruction, hernia, intra-abdominal abscess, AAA or dissection, volvulus or intussusception, mesenteric ischemia, or appendicitis.    ____________________________________________   FINAL CLINICAL IMPRESSION(S) / ED DIAGNOSES    Final diagnoses:   Nausea  Dehydration     ED Discharge Orders         Ordered    ondansetron (ZOFRAN ODT) 4 MG disintegrating tablet  Every 8 hours PRN        10/04/19 2233    famotidine (PEPCID) 20 MG tablet  2 times daily        10/04/19 2233          Portions of this note were generated with dragon dictation software. Dictation errors may occur despite best attempts at proofreading.   Carrie Mew, MD 10/04/19 2235

## 2019-10-17 ENCOUNTER — Other Ambulatory Visit: Payer: Self-pay | Admitting: Internal Medicine

## 2019-10-17 DIAGNOSIS — Z1231 Encounter for screening mammogram for malignant neoplasm of breast: Secondary | ICD-10-CM

## 2019-11-10 ENCOUNTER — Ambulatory Visit
Admission: RE | Admit: 2019-11-10 | Discharge: 2019-11-10 | Disposition: A | Discharge status: Home / Self Care | Payer: Medicare PPO | Source: Ambulatory Visit | Attending: Internal Medicine | Admitting: Internal Medicine

## 2019-11-10 ENCOUNTER — Other Ambulatory Visit: Payer: Self-pay

## 2019-11-10 DIAGNOSIS — Z1231 Encounter for screening mammogram for malignant neoplasm of breast: Secondary | ICD-10-CM

## 2020-02-17 ENCOUNTER — Other Ambulatory Visit: Payer: Self-pay

## 2020-02-17 DIAGNOSIS — K219 Gastro-esophageal reflux disease without esophagitis: Secondary | ICD-10-CM | POA: Insufficient documentation

## 2020-02-17 DIAGNOSIS — I6523 Occlusion and stenosis of bilateral carotid arteries: Secondary | ICD-10-CM

## 2020-03-01 ENCOUNTER — Other Ambulatory Visit: Payer: Self-pay

## 2020-03-01 ENCOUNTER — Ambulatory Visit (HOSPITAL_COMMUNITY)
Admission: RE | Admit: 2020-03-01 | Discharge: 2020-03-01 | Disposition: A | Discharge status: Home / Self Care | Payer: Medicare PPO | Source: Ambulatory Visit | Attending: Surgery | Admitting: Surgery

## 2020-03-01 ENCOUNTER — Ambulatory Visit: Payer: Medicare PPO | Admitting: Physician Assistant

## 2020-03-01 ENCOUNTER — Encounter: Payer: Self-pay | Admitting: Physician Assistant

## 2020-03-01 VITALS — BP 190/102 | HR 76 | Temp 97.9°F | Resp 20 | Ht 64.0 in | Wt 226.5 lb

## 2020-03-01 DIAGNOSIS — I6523 Occlusion and stenosis of bilateral carotid arteries: Secondary | ICD-10-CM | POA: Diagnosis not present

## 2020-03-01 NOTE — Progress Notes (Signed)
History of Present Illness:  Patient is a 86 y.o. year old female who presents for evaluation of asymptomatic carotid stenosis.  The patient denies symptoms of TIA, amaurosis, or stroke.  The patient is currently on ASA antiplatelet therapy.    She has right sided neck pain when she pokes the side of her neck.  It has been there on and off for 3 weeks.  She states she doesn't move much in her sleep.  No other new complaints.  Past Medical History:  Diagnosis Date  . Anxiety   . Arthritis    Back and knees  . Asthma    a. chronic SOB  . Carotid artery disease ([place])    a. carotid doppler 2016: 78% RICA, <46% LICA  . COPD (chronic obstructive pulmonary disease) ([place])   . Depression   . Diabetes type 2, controlled ([place])   . Hyperlipidemia   . Insomnia   . Labile hypertension    a. when she is stressed BP will increase to the 962'X systolic  . Medication intolerance   . Morbid obesity ([place])   . Normal coronary arteries    a. normal cath 2003; b. normal Myoview in 2008 & 2009  . OSA (obstructive sleep apnea)    a. BiPAP  . Physical deconditioning     Past Surgical History:  Procedure Laterality Date  . COLONOSCOPY WITH PROPOFOL N/A 07/31/2014   Procedure: COLONOSCOPY WITH PROPOFOL;  Surgeon: Hulen Luster, MD;  Location: [facility] ENDOSCOPY;  Service: Gastroenterology;  Laterality: N/A;  . DILATION AND CURETTAGE OF UTERUS    . FOOT SURGERY     Left foot, bunionectomy and hammer toe  . JOINT REPLACEMENT  06/05/11   Right knee replaced  . JOINT REPLACEMENT  09/20/11   Left knee  . right foot surgery Right April 2015   Right Foot and Ankle  . right knee surgery     arthroscopy for torn meniscus     Social History Social History   Tobacco Use  . Smoking status: Never Smoker  . Smokeless tobacco: Never Used  . Tobacco comment: tobacco use - no  Substance Use Topics  . Alcohol use: No    Alcohol/week: 0.0 standard drinks  . Drug use: No    Family History Family History   Problem Relation Age of Onset  . Heart disease Father        Heart Disease before age 63  . Diabetes Father   . Hyperlipidemia Father   . Heart attack Father   . Heart disease Mother   . Hypertension Mother   . Heart attack Mother   . Breast cancer Cousin 40    Allergies  Allergies  Allergen Reactions  . Penicillins Swelling, Rash and Other (See Comments)    [name], facial/tongue/throat swelling, [name], then may proceed with Cephalosporin use.   . Ace Inhibitors Other (See Comments)    Wasn't sleeping well  . Cefuroxime Axetil Diarrhea and Other (See Comments)  . Celebrex [Celecoxib] Cough  . Statins Other (See Comments)    Joint pain  . Iodinated Diagnostic Agents Swelling  and Rash    Swelling of the ankles  . Norpramin [Desipramine] Rash  . Phenobarbital Swelling and Rash    Swelling of the ankles  . Sinequan [Doxepin] Rash     Current Outpatient Medications  Medication Sig Dispense Refill  . albuterol (VENTOLIN HFA) 108 (90 Base) MCG/ACT inhaler Inhale 2 puffs into the lungs every 4 (four) hours as needed for wheezing. UAD 1 Inhaler 12  . ALPRAZolam (XANAX) 0.5 MG tablet Take 0.25-0.5 mg by mouth at bedtime as needed for sleep.    Marland Kitchen aspirin 81 MG tablet Take 81 mg by mouth daily.    . Azelastine HCl (ASTEPRO NA) Place 1 spray into the nose at bedtime as needed (congestion).     . Azilsartan Medoxomil 80 MG TABS Take 1 tablet by mouth daily.     . carvedilol (COREG) 6.25 MG tablet TAKE 1 TABLET BY MOUTH TWICE A DAY 60 tablet 3  . Cholecalciferol 50 MCG (2000 UT) TABS Take by mouth.    . cyanocobalamin  1000 MCG tablet Take by mouth.    . docusate sodium (COLACE) 100 MG capsule Take 100 mg by mouth 2 (two) times daily.     Marland Kitchen esomeprazole (NEXIUM) 40 MG capsule Take 40 mg by mouth daily before breakfast.      . famotidine (PEPCID) 20 MG tablet Take 1 tablet (20 mg total) by mouth 2 (two) times daily. 60 tablet 0  . Fluticasone-Salmeterol (ADVAIR) 100-50 MCG/DOSE AEPB USE 1 PUFF EVERY 12 HOURS AS DIRECTED. RINSE MOUTH AFTER USING.    . gabapentin (NEURONTIN) 100 MG capsule Take 200 mg by mouth at bedtime.     Marland Kitchen glucose blood (PRECISION QID TEST) test strip 1 each (1 strip total) 2 (two) times daily Use as instructed.    . hydrALAZINE (APRESOLINE) 25 MG tablet Take 25 mg by mouth 2 (two) times daily.    Marland Kitchen LORazepam (ATIVAN) 1 MG tablet Take 1 mg by mouth at bedtime.    . magnesium gluconate (MAGONATE) 500 MG tablet Take 500 mg by mouth daily.    . meclizine (ANTIVERT) 25 MG tablet Take 25 mg by mouth 3 (three) times daily as needed for dizziness.    . Melatonin 5 MG CAPS Take 1 capsule by mouth at bedtime as needed (sleep).     . metFORMIN (GLUCOPHAGE) 500 MG tablet Take 500 mg by mouth daily.      . metroNIDAZOLE (METROGEL) 0.75 % gel Apply topically 2 (two) times daily.    . mometasone (NASONEX) 50 MCG/ACT nasal spray Place 2 sprays into the nose daily as needed (allergies).     . ondansetron (ZOFRAN ODT) 4 MG disintegrating tablet Take 1 tablet (4 mg total) by mouth every 8 (eight) hours as needed for nausea or vomiting. 20 tablet 0  . pravastatin (PRAVACHOL) 20 MG tablet Take 20 mg by mouth daily.     . ferrous gluconate (FERGON) 324 MG tablet Take by mouth daily.    Marland Kitchen gabapentin (NEURONTIN) 300 MG capsule Take 2 capsules by mouth at bedtime.      No current facility-administered medications for this visit.    ROS:   General:  No weight loss, Fever, chills  HEENT: No recent headaches, no nasal bleeding, no visual changes, no sore throat  Neurologic: No dizziness, blackouts, seizures.  No recent symptoms of stroke or mini- stroke. No recent episodes of slurred speech, or temporary blindness.  Cardiac: No recent episodes of chest pain/pressure, no shortness of breath  at rest.  No shortness of breath with exertion.  Denies history of atrial fibrillation or irregular heartbeat  Vascular: No history of rest pain in feet.  No history of claudication.  No history of non-healing ulcer, No history of DVT   Pulmonary: No home oxygen, no productive cough, no hemoptysis,  No asthma or wheezing  Musculoskeletal:  [x ] Arthritis, [ ]  Low back pain,  [x ] Joint pain  Hematologic:No history of hypercoagulable state.  No history of easy bleeding.  No history of anemia  Gastrointestinal: No hematochezia or melena,  No gastroesophageal reflux, no trouble swallowing  Urinary: [ ]  chronic Kidney disease, [ ]  on HD - [ ]  MWF or [ ]  TTHS, [ ]  Burning with urination, [ ]  Frequent urination, [ ]  Difficulty urinating;   Skin: No rashes  Psychological: No history of anxiety,  No history of depression   Physical Examination  Vitals:   03/01/20 1348 03/01/20 1402  BP: (!) 188/98 (!) 190/102  Pulse: 76   Resp: 20   Temp: 97.9 F (36.6 C)   TempSrc: Temporal   SpO2: 98%   Weight: 226 lb 8 oz (102.7 kg)   Height: 5\' 4"  (1.626 m)     Body mass index is 38.88 kg/m.  General:  Alert and oriented, no acute distress HEENT: Normal Neck: No bruit or JVD Pulmonary: Clear to auscultation bilaterally Cardiac: Regular Rate and Rhythm without murmur Gastrointestinal: Soft, non-tender, non-distended, no mass, no scars Skin: No rash Musculoskeletal: No deformity or edema.  Neck range of motion does not reproduce pain on the right side of her neck.  Neurologic: Upper and lower extremity motor 5/5 and symmetric  DATA:    Right Carotid Findings:  +----------+--------+--------+--------+------------------+------------+       PSV cm/sEDV cm/sStenosisPlaque DescriptionComments     +----------+--------+--------+--------+------------------+------------+  CCA Prox 90   4                         +----------+--------+--------+--------+------------------+------------+  CCA Mid  62   9        heterogenous           +----------+--------+--------+--------+------------------+------------+  CCA Distal75   10       heterogenous           +----------+--------+--------+--------+------------------+------------+  ICA Prox 342   29   1-39%  heterogenous   Slight curve  +----------+--------+--------+--------+------------------+------------+  ICA Mid  77   20                        +----------+--------+--------+--------+------------------+------------+  ICA Distal77   19                        +----------+--------+--------+--------+------------------+------------+  ECA    320   31   >50%                   +----------+--------+--------+--------+------------------+------------+   +----------+--------+-------+----------------+-------------------+       PSV cm/sEDV cmsDescribe    Arm Pressure (mmHG)  +----------+--------+-------+----------------+-------------------+  [id number]   7   Multiphasic, WNL            +----------+--------+-------+----------------+-------------------+   +---------+--------+--+--------+--+---------+  VertebralPSV cm/s44EDV cm/s10Antegrade  +---------+--------+--+--------+--+---------+      Left Carotid Findings:  +----------+--------+--------+--------+------------------+--------+       PSV cm/sEDV cm/sStenosisPlaque DescriptionComments  +----------+--------+--------+--------+------------------+--------+  CCA Prox 89   14                       +----------+--------+--------+--------+------------------+--------+  CCA Mid  125   19                      +----------+--------+--------+--------+------------------+--------+  CCA Distal81   15       heterogenous         +----------+--------+--------+--------+------------------+--------+  ICA Prox 95   21   1-39%  calcific           +----------+--------+--------+--------+------------------+--------+  ICA Mid  87   18                      +----------+--------+--------+--------+------------------+--------+  ICA Distal87   24                      +----------+--------+--------+--------+------------------+--------+  ECA    117   4                       +----------+--------+--------+--------+------------------+--------+   +----------+--------+--------+----------------+-------------------+       PSV cm/sEDV cm/sDescribe    Arm Pressure (mmHG)  +----------+--------+--------+----------------+-------------------+  Subclavian132   3    Multiphasic, WNL            +----------+--------+--------+----------------+-------------------+   +---------+--------+--+--------+-+----------------------+  VertebralPSV cm/s43EDV cm/s9Antegrade and Atypical  +---------+--------+--+--------+-+----------------------+        Summary:  Right Carotid: Velocities in the right ICA are consistent with a 1-39%  stenosis.         The ECA appears >50% stenosed.   Left Carotid: Velocities in the left ICA are consistent with a 1-39%  stenosis.        The ECA appears <50% stenosed.   Vertebrals: The right vertebral artery demonstrate antegrade flow, the  left        vertebral artery demonstrates antegrade, atypical flow. .  Subclavians: Normal flow hemodynamics were seen in  bilateral subclavian        arteries.   ASSESSMENT:  Asymptomatic carotid stenosis B ICA stenosis <39% based on EDV   PLAN: She will cont. ASA daily and antihypertensive medication.  Her BP was elevated, but she wasn't sure if she took her medications this am or not.  F/U for repeat carotid duplex in 2 years.  If she develops symptoms of stroke or TIA she will call 911.   Ruta Hinds, MD [facility] [place] [facility] [place] Office: [phone] Pager: [phone]

## 2020-10-13 ENCOUNTER — Other Ambulatory Visit: Payer: Self-pay | Admitting: Internal Medicine

## 2020-10-13 DIAGNOSIS — Z1231 Encounter for screening mammogram for malignant neoplasm of breast: Secondary | ICD-10-CM

## 2020-10-15 ENCOUNTER — Observation Stay
Admission: EM | Admit: 2020-10-15 | Discharge: 2020-10-17 | Disposition: A | Discharge status: Home / Self Care | Payer: Medicare PPO | Attending: Emergency Medicine | Admitting: Emergency Medicine

## 2020-10-15 ENCOUNTER — Other Ambulatory Visit: Payer: Self-pay

## 2020-10-15 ENCOUNTER — Encounter: Payer: Self-pay | Admitting: Emergency Medicine

## 2020-10-15 ENCOUNTER — Emergency Department: Payer: Medicare PPO

## 2020-10-15 DIAGNOSIS — R112 Nausea with vomiting, unspecified: Secondary | ICD-10-CM | POA: Diagnosis not present

## 2020-10-15 DIAGNOSIS — E871 Hypo-osmolality and hyponatremia: Secondary | ICD-10-CM | POA: Diagnosis not present

## 2020-10-15 DIAGNOSIS — R42 Dizziness and giddiness: Secondary | ICD-10-CM | POA: Insufficient documentation

## 2020-10-15 DIAGNOSIS — Z7984 Long term (current) use of oral hypoglycemic drugs: Secondary | ICD-10-CM | POA: Diagnosis not present

## 2020-10-15 DIAGNOSIS — K922 Gastrointestinal hemorrhage, unspecified: Secondary | ICD-10-CM

## 2020-10-15 DIAGNOSIS — Z7982 Long term (current) use of aspirin: Secondary | ICD-10-CM | POA: Diagnosis not present

## 2020-10-15 DIAGNOSIS — I160 Hypertensive urgency: Secondary | ICD-10-CM | POA: Diagnosis not present

## 2020-10-15 DIAGNOSIS — E119 Type 2 diabetes mellitus without complications: Secondary | ICD-10-CM | POA: Diagnosis not present

## 2020-10-15 DIAGNOSIS — Z20822 Contact with and (suspected) exposure to covid-19: Secondary | ICD-10-CM | POA: Diagnosis not present

## 2020-10-15 DIAGNOSIS — Z79899 Other long term (current) drug therapy: Secondary | ICD-10-CM | POA: Insufficient documentation

## 2020-10-15 DIAGNOSIS — E1151 Type 2 diabetes mellitus with diabetic peripheral angiopathy without gangrene: Secondary | ICD-10-CM | POA: Diagnosis present

## 2020-10-15 DIAGNOSIS — Z96653 Presence of artificial knee joint, bilateral: Secondary | ICD-10-CM | POA: Insufficient documentation

## 2020-10-15 DIAGNOSIS — I2510 Atherosclerotic heart disease of native coronary artery without angina pectoris: Secondary | ICD-10-CM | POA: Diagnosis not present

## 2020-10-15 DIAGNOSIS — J449 Chronic obstructive pulmonary disease, unspecified: Secondary | ICD-10-CM | POA: Diagnosis present

## 2020-10-15 DIAGNOSIS — R110 Nausea: Secondary | ICD-10-CM | POA: Diagnosis present

## 2020-10-15 DIAGNOSIS — R1013 Epigastric pain: Secondary | ICD-10-CM

## 2020-10-15 DIAGNOSIS — K2901 Acute gastritis with bleeding: Secondary | ICD-10-CM

## 2020-10-15 DIAGNOSIS — J45909 Unspecified asthma, uncomplicated: Secondary | ICD-10-CM | POA: Diagnosis not present

## 2020-10-15 LAB — CBC
HCT: 36.9 % (ref 36.0–46.0)
Hemoglobin: 12.7 g/dL (ref 12.0–15.0)
MCH: 28.4 pg (ref 26.0–34.0)
MCHC: 34.4 g/dL (ref 30.0–36.0)
MCV: 82.6 fL (ref 80.0–100.0)
Platelets: 354 10*3/uL (ref 150–400)
RBC: 4.47 MIL/uL (ref 3.87–5.11)
RDW: 15.5 % (ref 11.5–15.5)
WBC: 8.1 10*3/uL (ref 4.0–10.5)
nRBC: 0 % (ref 0.0–0.2)

## 2020-10-15 LAB — COMPREHENSIVE METABOLIC PANEL
ALT: 18 U/L (ref 0–44)
AST: 17 U/L (ref 15–41)
Albumin: 3.9 g/dL (ref 3.5–5.0)
Alkaline Phosphatase: 72 U/L (ref 38–126)
Anion gap: 8 (ref 5–15)
BUN: 16 mg/dL (ref 8–23)
CO2: 27 mmol/L (ref 22–32)
Calcium: 9.5 mg/dL (ref 8.9–10.3)
Chloride: 94 mmol/L — ABNORMAL LOW (ref 98–111)
Creatinine, Ser: 0.66 mg/dL (ref 0.44–1.00)
GFR, Estimated: >60 mL/min (ref >60)
Glucose, Bld: 181 mg/dL — ABNORMAL HIGH (ref 70–99)
Potassium: 3.8 mmol/L (ref 3.5–5.1)
Sodium: 129 mmol/L — ABNORMAL LOW (ref 135–145)
Total Bilirubin: 0.6 mg/dL (ref 0.3–1.2)
Total Protein: 7.2 g/dL (ref 6.5–8.1)

## 2020-10-15 LAB — TROPONIN I (HIGH SENSITIVITY): Troponin I (High Sensitivity): 11 ng/L (ref <18)

## 2020-10-15 LAB — LIPASE, BLOOD: Lipase: 35 U/L (ref 11–51)

## 2020-10-15 MED ORDER — SODIUM CHLORIDE 0.9 % IV BOLUS
1000.0000 mL | Freq: Once | INTRAVENOUS | Status: AC
  Administered 2020-10-16: 1000 mL via INTRAVENOUS

## 2020-10-15 MED ORDER — FAMOTIDINE IN NACL 20-0.9 MG/50ML-% IV SOLN
20.0000 mg | Freq: Once | INTRAVENOUS | Status: AC
  Administered 2020-10-16: 20 mg via INTRAVENOUS
  Filled 2020-10-15 (×2): qty 50

## 2020-10-15 MED ORDER — ONDANSETRON HCL 4 MG/2ML IJ SOLN
4.0000 mg | Freq: Once | INTRAMUSCULAR | Status: AC
  Administered 2020-10-16: 4 mg via INTRAVENOUS
  Filled 2020-10-15: qty 2

## 2020-10-15 NOTE — ED Triage Notes (Signed)
Patient to ED via POV for nausea and abdominal pain. Was sent by [facility] due to HTN. Patient Aox4.

## 2020-10-15 NOTE — ED Triage Notes (Signed)
Pt arrived to ED via POV from the walk in clinic, they sent her here to be evaluated because she has had nausea and HTN for the last several days. Pt is A&Ox4. Skin WNL, NAD at this time.

## 2020-10-16 ENCOUNTER — Encounter: Payer: Self-pay | Admitting: Internal Medicine

## 2020-10-16 DIAGNOSIS — I160 Hypertensive urgency: Secondary | ICD-10-CM | POA: Diagnosis not present

## 2020-10-16 DIAGNOSIS — R112 Nausea with vomiting, unspecified: Principal | ICD-10-CM

## 2020-10-16 DIAGNOSIS — E871 Hypo-osmolality and hyponatremia: Secondary | ICD-10-CM

## 2020-10-16 DIAGNOSIS — R1013 Epigastric pain: Secondary | ICD-10-CM

## 2020-10-16 LAB — BASIC METABOLIC PANEL
Anion gap: 8 (ref 5–15)
BUN: 15 mg/dL (ref 8–23)
CO2: 26 mmol/L (ref 22–32)
Calcium: 8.9 mg/dL (ref 8.9–10.3)
Chloride: 96 mmol/L — ABNORMAL LOW (ref 98–111)
Creatinine, Ser: 0.57 mg/dL (ref 0.44–1.00)
GFR, Estimated: >60 mL/min (ref >60)
Glucose, Bld: 120 mg/dL — ABNORMAL HIGH (ref 70–99)
Potassium: 3.1 mmol/L — ABNORMAL LOW (ref 3.5–5.1)
Sodium: 130 mmol/L — ABNORMAL LOW (ref 135–145)

## 2020-10-16 LAB — CBC
HCT: 35.4 % — ABNORMAL LOW (ref 36.0–46.0)
Hemoglobin: 12.3 g/dL (ref 12.0–15.0)
MCH: 28.6 pg (ref 26.0–34.0)
MCHC: 34.7 g/dL (ref 30.0–36.0)
MCV: 82.3 fL (ref 80.0–100.0)
Platelets: 341 K/uL (ref 150–400)
RBC: 4.3 MIL/uL (ref 3.87–5.11)
RDW: 15.1 % (ref 11.5–15.5)
WBC: 9.7 K/uL (ref 4.0–10.5)
nRBC: 0 % (ref 0.0–0.2)

## 2020-10-16 LAB — TYPE AND SCREEN
ABO/RH(D): B POS
Antibody Screen: NEGATIVE

## 2020-10-16 LAB — CBG MONITORING, ED
Glucose-Capillary: 169 mg/dL — ABNORMAL HIGH (ref 70–99)
Glucose-Capillary: 123 mg/dL — ABNORMAL HIGH (ref 70–99)
Glucose-Capillary: 124 mg/dL — ABNORMAL HIGH (ref 70–99)

## 2020-10-16 LAB — GLUCOSE, CAPILLARY
Glucose-Capillary: 100 mg/dL — ABNORMAL HIGH (ref 70–99)
Glucose-Capillary: 103 mg/dL — ABNORMAL HIGH (ref 70–99)
Glucose-Capillary: 93 mg/dL (ref 70–99)

## 2020-10-16 LAB — URINALYSIS, COMPLETE (UACMP) WITH MICROSCOPIC
Bilirubin Urine: NEGATIVE
Glucose, UA: 50 mg/dL — AB
Hgb urine dipstick: NEGATIVE
Ketones, ur: NEGATIVE mg/dL
Leukocytes,Ua: NEGATIVE
Nitrite: NEGATIVE
Protein, ur: 100 mg/dL — AB
Specific Gravity, Urine: 1.017 (ref 1.005–1.030)
pH: 7 (ref 5.0–8.0)

## 2020-10-16 LAB — RESP PANEL BY RT-PCR (FLU A&B, COVID) ARPGX2
Influenza A by PCR: NEGATIVE
Influenza B by PCR: NEGATIVE
SARS Coronavirus 2 by RT PCR: NEGATIVE

## 2020-10-16 MED ORDER — SODIUM CHLORIDE 0.9 % IV SOLN: INTRAVENOUS | Status: DC

## 2020-10-16 MED ORDER — LORAZEPAM 0.5 MG PO TABS: 0.5000 mg | ORAL_TABLET | Freq: Every evening | ORAL | Status: DC | PRN

## 2020-10-16 MED ORDER — ONDANSETRON HCL 4 MG/2ML IJ SOLN
4.0000 mg | Freq: Four times a day (QID) | INTRAMUSCULAR | Status: DC | PRN
  Filled 2020-10-16: qty 2

## 2020-10-16 MED ORDER — IRBESARTAN 150 MG PO TABS
300.0000 mg | ORAL_TABLET | Freq: Every day | ORAL | Status: DC
  Administered 2020-10-16 – 2020-10-17 (×2): 300 mg via ORAL
  Filled 2020-10-16 (×2): qty 2

## 2020-10-16 MED ORDER — GABAPENTIN 300 MG PO CAPS
300.0000 mg | ORAL_CAPSULE | Freq: Two times a day (BID) | ORAL | Status: DC
  Administered 2020-10-16 – 2020-10-17 (×4): 300 mg via ORAL
  Filled 2020-10-16 (×4): qty 1

## 2020-10-16 MED ORDER — CARVEDILOL 6.25 MG PO TABS
6.2500 mg | ORAL_TABLET | Freq: Two times a day (BID) | ORAL | Status: DC
  Administered 2020-10-16 (×2): 6.25 mg via ORAL
  Filled 2020-10-16 (×2): qty 1

## 2020-10-16 MED ORDER — INSULIN ASPART 100 UNIT/ML IJ SOLN
0.0000 [IU] | INTRAMUSCULAR | Status: DC
  Administered 2020-10-16: 2 [IU] via SUBCUTANEOUS
  Administered 2020-10-16: 3 [IU] via SUBCUTANEOUS
  Administered 2020-10-16 – 2020-10-17 (×2): 2 [IU] via SUBCUTANEOUS
  Filled 2020-10-16 (×4): qty 1

## 2020-10-16 MED ORDER — PANTOPRAZOLE INFUSION (NEW) - SIMPLE MED
8.0000 mg/h | INTRAVENOUS | Status: DC
  Administered 2020-10-16 (×3): 8 mg/h via INTRAVENOUS
  Filled 2020-10-16 (×2): qty 100
  Filled 2020-10-16 (×2): qty 80

## 2020-10-16 MED ORDER — FLUTICASONE PROPIONATE 50 MCG/ACT NA SUSP
2.0000 | Freq: Every day | NASAL | Status: DC | PRN
  Administered 2020-10-17: 2 via NASAL
  Filled 2020-10-16 (×2): qty 16

## 2020-10-16 MED ORDER — ALBUTEROL SULFATE (2.5 MG/3ML) 0.083% IN NEBU: 3.0000 mL | INHALATION_SOLUTION | RESPIRATORY_TRACT | Status: DC | PRN

## 2020-10-16 MED ORDER — ONDANSETRON HCL 4 MG PO TABS: 4.0000 mg | ORAL_TABLET | Freq: Four times a day (QID) | ORAL | Status: DC | PRN

## 2020-10-16 MED ORDER — ASPIRIN EC 81 MG PO TBEC
81.0000 mg | DELAYED_RELEASE_TABLET | Freq: Every day | ORAL | Status: DC
  Administered 2020-10-16: 81 mg via ORAL
  Filled 2020-10-16: qty 1

## 2020-10-16 MED ORDER — HYDRALAZINE HCL 20 MG/ML IJ SOLN
10.0000 mg | Freq: Four times a day (QID) | INTRAMUSCULAR | Status: DC | PRN
  Administered 2020-10-16: 10 mg via INTRAVENOUS
  Filled 2020-10-16 (×2): qty 1

## 2020-10-16 MED ORDER — METOCLOPRAMIDE HCL 5 MG/ML IJ SOLN
5.0000 mg | Freq: Once | INTRAMUSCULAR | Status: AC
  Administered 2020-10-16: 5 mg via INTRAVENOUS
  Filled 2020-10-16: qty 2

## 2020-10-16 MED ORDER — HYDRALAZINE HCL 20 MG/ML IJ SOLN
10.0000 mg | Freq: Once | INTRAMUSCULAR | Status: AC
  Administered 2020-10-16: 10 mg via INTRAVENOUS
  Filled 2020-10-16: qty 1

## 2020-10-16 MED ORDER — PANTOPRAZOLE 80MG IVPB - SIMPLE MED
80.0000 mg | Freq: Once | INTRAVENOUS | Status: AC
  Administered 2020-10-16: 80 mg via INTRAVENOUS
  Filled 2020-10-16: qty 80

## 2020-10-16 MED ORDER — ACETAMINOPHEN 325 MG PO TABS
650.0000 mg | ORAL_TABLET | Freq: Four times a day (QID) | ORAL | Status: DC | PRN
  Administered 2020-10-16 – 2020-10-17 (×2): 650 mg via ORAL
  Filled 2020-10-16 (×2): qty 2

## 2020-10-16 MED ORDER — PRAVASTATIN SODIUM 20 MG PO TABS
20.0000 mg | ORAL_TABLET | Freq: Every day | ORAL | Status: DC
  Administered 2020-10-16: 20 mg via ORAL
  Filled 2020-10-16: qty 1

## 2020-10-16 MED ORDER — GABAPENTIN 300 MG PO CAPS
600.0000 mg | ORAL_CAPSULE | Freq: Every day | ORAL | Status: DC
  Administered 2020-10-16: 600 mg via ORAL
  Filled 2020-10-16: qty 2

## 2020-10-16 MED ORDER — DOCUSATE SODIUM 100 MG PO CAPS
100.0000 mg | ORAL_CAPSULE | Freq: Two times a day (BID) | ORAL | Status: DC
  Administered 2020-10-16 – 2020-10-17 (×3): 100 mg via ORAL
  Filled 2020-10-16 (×3): qty 1

## 2020-10-16 MED ORDER — HYDRALAZINE HCL 25 MG PO TABS
25.0000 mg | ORAL_TABLET | Freq: Two times a day (BID) | ORAL | Status: DC
  Administered 2020-10-16 – 2020-10-17 (×3): 25 mg via ORAL
  Filled 2020-10-16 (×3): qty 1

## 2020-10-16 NOTE — ED Provider Notes (Signed)
[facility]  ____________________________________________   Event Date/Time   First MD Initiated Contact with Patient 10/15/20 2218     (approximate)  I have reviewed the triage vital signs and the nursing notes.   HISTORY  Chief Complaint Hypertension, Nausea, and Abdominal Pain    HPI Amber Welch is a 86 y.o. female past medical history of hypertension, hyperlipidemia, CAD, type 2 diabetes who presents with nausea.  Symptoms been going on for about 1 week.  She initially attributed this to her medications however there have been no recent changes.  She had not actually vomited until coming to the emergency department.  Has had decreased p.o. intake.  She denies significant abdominal pain, does have some burning in the epigastric region since vomiting.  She denies fevers chills.  She denies headache visual change numbness or weakness.  No dysuria.  Patient has had similar episodes to this before.         Past Medical History:  Diagnosis Date   Anxiety    Arthritis    Back and knees   Asthma    a. chronic SOB   Carotid artery disease ([place])    a. carotid doppler 2016: 67% RICA, <89% LICA   COPD (chronic obstructive pulmonary disease) (HCC)    Depression    Diabetes type 2, controlled ([place])    Hyperlipidemia    Insomnia    Labile hypertension    a. when she is stressed BP will increase to the 381'O systolic   Medication intolerance    Morbid obesity ([place])    Normal coronary arteries    a. normal cath 2003; b. normal Myoview in 2008 & 2009   OSA (obstructive sleep apnea)    a. BiPAP   Physical deconditioning     Patient Active Problem List   Diagnosis Date Noted   GERD (gastroesophageal reflux disease) 02/17/2020   Type 2 diabetes mellitus with peripheral angiopathy ([place]) 07/24/2017   Spondylosis of cervical region without myelopathy or radiculopathy 11/10/2016   Low serum vitamin D 09/18/2016   Lumbar disc disease 07/19/2016    Hyponatremia 02/04/2016   History of smoking 07/16/2015   Morbid obesity ([place])    Normal coronary arteries    OSA (obstructive sleep apnea)    Asthma    Physical deconditioning    Medication intolerance    Anxiety    Depression    Insomnia    Diabetic sensorimotor neuropathy ([place]) 07/12/2015   Proliferative retinopathy due to secondary diabetes ([place]) 07/12/2015   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   COPD (chronic obstructive pulmonary disease) ([place]) 01/08/2015   Status post total bilateral knee replacement 10/10/2014   Labile blood pressure 05/11/2011   Hyperlipidemia 05/11/2011   Carotid artery stenosis 01/26/2010   SHORTNESS OF BREATH 01/26/2010   CHEST TIGHTNESS-PRESSURE-OTHER 01/26/2010    Past Surgical History:  Procedure Laterality Date   COLONOSCOPY WITH PROPOFOL N/A 07/31/2014   Procedure: COLONOSCOPY WITH PROPOFOL;  Surgeon: Hulen Luster, MD;  Location: [facility] ENDOSCOPY;  Service: Gastroenterology;  Laterality: N/A;   DILATION AND CURETTAGE OF UTERUS     FOOT SURGERY     Left foot, bunionectomy and hammer toe   JOINT REPLACEMENT  06/05/11   Right knee replaced   JOINT REPLACEMENT  09/20/11   Left knee   right foot surgery Right April 2015   Right Foot and Ankle   right knee surgery     arthroscopy for torn meniscus    Prior to Admission  medications   Medication Sig Start Date End Date Taking? Authorizing Provider  albuterol (VENTOLIN HFA) 108 (90 Base) MCG/ACT inhaler Inhale 2 puffs into the lungs every 4 (four) hours as needed for wheezing. UAD 01/08/15   Flora Lipps, MD  ALPRAZolam Duanne Moron) 0.5 MG tablet Take 0.25-0.5 mg by mouth at bedtime as needed for sleep.    [provider]  aspirin 81 MG tablet Take 81 mg by mouth daily.    [provider]  Azelastine HCl (ASTEPRO NA) Place 1 spray into the nose at bedtime as needed (congestion).     [provider]  Azilsartan Medoxomil 80 MG TABS Take 1 tablet by mouth daily.     [provider]  carvedilol (COREG) 6.25 MG tablet TAKE 1 TABLET BY MOUTH TWICE A DAY 02/17/19   Minna Merritts, MD  Cholecalciferol 50 MCG (2000 UT) TABS Take by mouth.    [provider]  cyanocobalamin 1000 MCG tablet Take by mouth.    [provider]  docusate sodium (COLACE) 100 MG capsule Take 100 mg by mouth 2 (two) times daily.     [provider]  esomeprazole (NEXIUM) 40 MG capsule Take 40 mg by mouth daily before breakfast.      [provider]  famotidine (PEPCID) 20 MG tablet Take 1 tablet (20 mg total) by mouth 2 (two) times daily. 10/04/19   Carrie Mew, MD  ferrous gluconate (FERGON) 324 MG tablet Take by mouth daily. 01/31/18 01/31/19  [provider]  Fluticasone-Salmeterol (ADVAIR) 100-50 MCG/DOSE AEPB USE 1 PUFF EVERY 12 HOURS AS DIRECTED. RINSE MOUTH AFTER USING. 03/12/19   [provider]  gabapentin (NEURONTIN) 100 MG capsule Take 200 mg by mouth at bedtime.  07/01/11   [provider]  gabapentin (NEURONTIN) 300 MG capsule Take 2 capsules by mouth at bedtime.  07/24/16 02/12/18  [provider]  glucose blood (PRECISION QID TEST) test strip 1 each (1 strip total) 2 (two) times daily Use as instructed. 01/05/20 01/04/21  [provider]  hydrALAZINE (APRESOLINE) 25 MG tablet Take 25 mg by mouth 2 (two) times daily.    [provider]  LORazepam (ATIVAN) 1 MG tablet Take 1 mg by mouth at bedtime.    [provider]  magnesium gluconate (MAGONATE) 500 MG tablet Take 500 mg by mouth daily.    [provider]  meclizine (ANTIVERT) 25 MG tablet Take 25 mg by mouth 3 (three) times daily as needed for dizziness.    [provider]  Melatonin 5 MG CAPS Take 1 capsule by mouth at bedtime as needed (sleep).     [provider]  metFORMIN (GLUCOPHAGE) 500 MG tablet Take 500 mg by mouth daily.      [provider]  metroNIDAZOLE (METROGEL) 0.75 % gel Apply topically 2  (two) times daily. 08/19/19   [provider]  mometasone (NASONEX) 50 MCG/ACT nasal spray Place 2 sprays into the nose daily as needed (allergies).     [provider]  ondansetron (ZOFRAN ODT) 4 MG disintegrating tablet Take 1 tablet (4 mg total) by mouth every 8 (eight) hours as needed for nausea or vomiting. 10/04/19   Carrie Mew, MD  pravastatin (PRAVACHOL) 20 MG tablet Take 20 mg by mouth daily.  01/31/18   [provider]    Allergies Penicillins, Ace inhibitors, Cefuroxime axetil, Celebrex [celecoxib], Statins, Iodinated diagnostic agents, Norpramin [desipramine], Phenobarbital, and Sinequan [doxepin]  Family History  Problem Relation Age  of Onset   Heart disease Father        Heart Disease before age 43   Diabetes Father    Hyperlipidemia Father    Heart attack Father    Heart disease Mother    Hypertension Mother    Heart attack Mother    Breast cancer Cousin 63    Social History Social History   Tobacco Use   Smoking status: Never   Smokeless tobacco: Never   Tobacco comments:    tobacco use - no  Substance Use Topics   Alcohol use: No    Alcohol/week: 0.0 standard drinks   Drug use: No    Review of Systems   Review of Systems  Constitutional:  Positive for appetite change. Negative for chills and fever.  Eyes:  Negative for visual disturbance.  Respiratory:  Negative for shortness of breath.   Cardiovascular:  Negative for chest pain.  Gastrointestinal:  Positive for nausea and vomiting. Negative for abdominal pain and diarrhea.  Neurological:  Negative for weakness, numbness and headaches.  All other systems reviewed and are negative.  Physical Exam Updated Vital Signs BP (!) 215/108 (BP Location: Left Arm)   Pulse 77   Temp 98.4 F (36.9 C) (Oral)   Resp 18   Ht 5\' 4"  (1.626 m)   Wt 99.8 kg   SpO2 97%   BMI 37.76 kg/m   Physical Exam Vitals and nursing note reviewed.  Constitutional:      General: She is not in  acute distress.    Appearance: Normal appearance.     Comments: Patient is vomiting, appears uncomfortable  HENT:     Head: Normocephalic and atraumatic.     Comments: Dry mucous membranes Eyes:     General: No scleral icterus.    Conjunctiva/sclera: Conjunctivae normal.  Pulmonary:     Effort: Pulmonary effort is normal. No respiratory distress.     Breath sounds: No stridor.  Abdominal:     General: Abdomen is flat. There is no distension.     Palpations: Abdomen is soft.     Tenderness: There is no abdominal tenderness.  Musculoskeletal:        General: No deformity or signs of injury.     Cervical back: Normal range of motion.  Skin:    General: Skin is dry.     Coloration: Skin is not jaundiced or pale.  Neurological:     General: No focal deficit present.     Mental Status: She is alert and oriented to person, place, and time. Mental status is at baseline.  Psychiatric:        Mood and Affect: Mood normal.        Behavior: Behavior normal.     LABS (all labs ordered are listed, but only abnormal results are displayed)  Labs Reviewed  COMPREHENSIVE METABOLIC PANEL - Abnormal; Notable for the following components:      Result Value   Sodium 129 (*)    Chloride 94 (*)    Glucose, Bld 181 (*)    All other components within normal limits  LIPASE, BLOOD  CBC  URINALYSIS, COMPLETE (UACMP) WITH MICROSCOPIC  TROPONIN I (HIGH SENSITIVITY)   ____________________________________________  EKG  Pending at the time of signout ____________________________________________  RADIOLOGY Almeta Monas, personally viewed and evaluated these images (plain radiographs) as part of my medical decision making, as well as reviewing the written report by the radiologist.  ED MD interpretation: I reviewed the CT of  the head which shows no acute intracranial process    ____________________________________________   PROCEDURES  Procedure(s) performed (including Critical  Care):  Procedures   ____________________________________________   INITIAL IMPRESSION / ASSESSMENT AND PLAN / ED COURSE     Patient is an 86 year old female presents with 1 week of nausea no vomiting in the ED.  She has minimal other associated symptoms.  Initial triage complaint says abdominal pain however patient really denies significant abdominal pain, has had some burning since vomiting that has not been going on for the past week.  No associated diarrhea headache neurologic symptoms or fevers.  On exam she does appear somewhat uncomfortable and dry.  However she has a nonfocal neurologic exam and a benign abdomen.  Initial blood pressures hypertensive but patient also uncomfortable during this.  Her labs obtained from triage are reassuring other than mildly decreased sodium to 129, do not feel that this is causing the nausea likely result of being dehydrated.  Obtain a CT of her head given the unclear source of her nausea which was negative for acute process.  Will check a troponin and urinalysis and treat symptomatically with fluids Zofran and Pepcid.  Has not yet received meds at time of signout.  Will need reassessment.   Clinical Course as of 10/16/20 0022  Fri Oct 15, 2020  2259 Troponin I (High Sensitivity): 11 [KM]  2310   IMPRESSION: No evidence of acute intracranial abnormality. Small vessel ischemic changes.     [KM]    Clinical Course User Index [KM] Rada Hay, MD     ____________________________________________   FINAL CLINICAL IMPRESSION(S) / ED DIAGNOSES  Final diagnoses:  Nausea and vomiting, unspecified vomiting type     ED Discharge Orders     None        Note:  This document was prepared using Dragon voice recognition software and may include unintentional dictation errors.    Rada Hay, MD 10/16/20 Benancio Deeds

## 2020-10-16 NOTE — ED Notes (Signed)
Report to marshell, rn.

## 2020-10-16 NOTE — ED Notes (Signed)
Waiting on protonix drip from pharamcy

## 2020-10-16 NOTE — ED Provider Notes (Signed)
-----------------------------------------   1:36 AM on 10/16/2020 -----------------------------------------   Patient remains nauseated and hypertensive.  Daughter tells me patient has been taking excessive ibuprofen for the past several months for chronic shoulder pain.  Rectal exam performed which was guaiac positive.  Will start IV Protonix with drip, keep patient n.p.o., IV hydralazine for persistent hypertension secondary to patient too nauseated to take her medicines.  Will discuss with hospitalist services for admission.   ED ECG REPORT I, Sherise Geerdes J, the attending physician, personally viewed and interpreted this ECG.   Date: 10/16/2020  EKG Time: 0305  Rate: 70  Rhythm: normal sinus rhythm  Axis: Normal  Intervals:none  ST&T Change: Nonspecific EKG machine miscounting rhythm and rate    Paulette Blanch, MD 10/16/20 [phone]

## 2020-10-16 NOTE — H&P (Addendum)
History and Physical    Amber Welch OIN:[id number] DOB: 1934-02-22 DOA: 10/15/2020  PCP: Amber Aus, MD   Patient coming from: home  I have personally briefly reviewed patient's old medical records in [place]  Chief Complaint: nausea,  headache  HPI: Amber Welch is a 86 y.o. female with medical history significant for COPD, HTN, diabetes with complications who presents to the ED from her PCPs office where she presented with headache and intractable nausea with finding of blood pressure 190/109.  Patient has been unable to take her blood pressure medication because of the nausea  Patient has associated burning epigastric pain of moderate intensity nonradiating.  Patient has been using ibuprofen chronically for her shoulder pain.  Denies diarrhea denies cough, shortness of breath, fever or chills.  She denies visual disturbance one-sided weakness numbness or tingling.  Started vomiting on arrival to the ED.  Vomiting was nonbloody and nonbilious.  ED course: On arrival BP was 215/108 with otherwise normal vitals Blood work notable only for sodium of 129.  Blood glucose 181.  Lipase and LFTs and creatinine WNL.  Troponin normal at 11.  Urinalysis mostly unremarkable.  Stool guaiac weakly positive  CT head with no evidence of acute intracranial abnormality.  Small vessel ischemic changes  Patient was treated with IV hydralazine for her blood pressure and Zofran and Reglan for the nausea but continued to be very symptomatic and with elevated blood pressures.  She was started on Protonix infusion.  Hospitalist consulted for admission.  Review of Systems: As per HPI otherwise all other systems on review of systems negative.    Past Medical History:  Diagnosis Date   Anxiety    Arthritis    Back and knees   Asthma    a. chronic SOB   Carotid artery disease ([place])    a. carotid doppler 2016: 70% RICA, <96% LICA   COPD (chronic obstructive pulmonary disease) (HCC)     Depression    Diabetes type 2, controlled ([place])    Hyperlipidemia    Insomnia    Labile hypertension    a. when she is stressed BP will increase to the 283'M systolic   Medication intolerance    Morbid obesity ([place])    Normal coronary arteries    a. normal cath 2003; b. normal Myoview in 2008 & 2009   OSA (obstructive sleep apnea)    a. BiPAP   Physical deconditioning     Past Surgical History:  Procedure Laterality Date   COLONOSCOPY WITH PROPOFOL N/A 07/31/2014   Procedure: COLONOSCOPY WITH PROPOFOL;  Surgeon: Hulen Luster, MD;  Location[facility] END[facility]COPY;  Service: Gastroenterology;  Laterality: N/A;   DILATION AND CURETTAGE OF UTERUS     FOOT SURGERY     Left foot, bunionectomy and hammer toe   JOINT REPLACEMENT  06/05/11   Right knee replaced   JOINT REPLACEMENT  09/20/11   Left knee   right foot surgery Right April 2015   Right Foot and Ankle   right knee surgery     arthroscopy for torn meniscus     reports that she has never smoked. She has never used smokeless tobacco. She reports that she does not drink alcohol and does not use drugs.  Allergies  Allergen Reactions   Penicillins Swelling, Rash and Other (See Comments)    [name], facial/tongue/throat swelling, SOB or lightheadedness with hypotension: Yes Has patient had  a PCN reaction causing severe rash involving mucus membranes or skin necrosis: No Has patient had a PCN reaction that required hospitalization No Has patient had a PCN reaction occurring within the last 10 years: No If all of the above answers are "NO", then may proceed with Cephalosporin use.    Ace Inhibitors Other (See Comments)    Wasn't sleeping well   Cefuroxime Axetil Diarrhea and Other (See Comments)   Celebrex [Celecoxib] Cough   Statins Other (See Comments)    Joint pain   Iodinated Diagnostic Agents Swelling and Rash    Swelling of the ankles   Norpramin [Desipramine] Rash    Phenobarbital Swelling and Rash    Swelling of the ankles   Sinequan [Doxepin] Rash    Family History  Problem Relation Age of Onset   Heart disease Father        Heart Disease before age 55   Diabetes Father    Hyperlipidemia Father    Heart attack Father    Heart disease Mother    Hypertension Mother    Heart attack Mother    Breast cancer Cousin 85      Prior to Admission medications   Medication Sig Start Date End Date Taking? Authorizing Provider  albuterol (VENTOLIN HFA) 108 (90 Base) MCG/ACT inhaler Inhale 2 puffs into the lungs every 4 (four) hours as needed for wheezing. UAD 01/08/15   Flora Lipps, MD  ALPRAZolam Duanne Moron) 0.5 MG tablet Take 0.25-0.5 mg by mouth at bedtime as needed for sleep.    [provider]  aspirin 81 MG tablet Take 81 mg by mouth daily.    [provider]  Azelastine HCl (ASTEPRO NA) Place 1 spray into the nose at bedtime as needed (congestion).     [provider]  Azilsartan Medoxomil 80 MG TABS Take 1 tablet by mouth daily.     [provider]  carvedilol (COREG) 6.25 MG tablet TAKE 1 TABLET BY MOUTH TWICE A DAY 02/17/19   Minna Merritts, MD  Cholecalciferol 50 MCG (2000 UT) TABS Take by mouth.    [provider]  cyanocobalamin 1000 MCG tablet Take by mouth.    [provider]  docusate sodium (COLACE) 100 MG capsule Take 100 mg by mouth 2 (two) times daily.     [provider]  esomeprazole (NEXIUM) 40 MG capsule Take 40 mg by mouth daily before breakfast.      [provider]  famotidine (PEPCID) 20 MG tablet Take 1 tablet (20 mg total) by mouth 2 (two) times daily. 10/04/19   Carrie Mew, MD  ferrous gluconate (FERGON) 324 MG tablet Take by mouth daily. 01/31/18 01/31/19  [provider]  Fluticasone-Salmeterol (ADVAIR) 100-50 MCG/DOSE AEPB USE 1 PUFF EVERY 12 HOURS AS DIRECTED. RINSE MOUTH AFTER USING. 03/12/19   [provider]  gabapentin (NEURONTIN)  100 MG capsule Take 200 mg by mouth at bedtime.  07/01/11   [provider]  gabapentin (NEURONTIN) 300 MG capsule Take 2 capsules by mouth at bedtime.  07/24/16 02/12/18  [provider]  glucose blood (PRECISION QID TEST) test strip 1 each (1 strip total) 2 (two) times daily Use as instructed. 01/05/20 01/04/21  [provider]  hydrALAZINE (APRESOLINE) 25 MG tablet Take 25 mg by mouth 2 (two) times daily.    [provider]  LORazepam (ATIVAN) 1 MG tablet Take 1 mg by mouth at bedtime.    [provider]  magnesium gluconate (MAGONATE) 500  MG tablet Take 500 mg by mouth daily.    [provider]  meclizine (ANTIVERT) 25 MG tablet Take 25 mg by mouth 3 (three) times daily as needed for dizziness.    [provider]  Melatonin 5 MG CAPS Take 1 capsule by mouth at bedtime as needed (sleep).     [provider]  metFORMIN (GLUCOPHAGE) 500 MG tablet Take 500 mg by mouth daily.      [provider]  metroNIDAZOLE (METROGEL) 0.75 % gel Apply topically 2 (two) times daily. 08/19/19   [provider]  mometasone (NASONEX) 50 MCG/ACT nasal spray Place 2 sprays into the nose daily as needed (allergies).     [provider]  ondansetron (ZOFRAN ODT) 4 MG disintegrating tablet Take 1 tablet (4 mg total) by mouth every 8 (eight) hours as needed for nausea or vomiting. 10/04/19   Carrie Mew, MD  pravastatin (PRAVACHOL) 20 MG tablet Take 20 mg by mouth daily.  01/31/18   [provider]    Physical Exam: Vitals:   10/16/20 0019 10/16/20 0100 10/16/20 0130 10/16/20 0200  BP: (!) 206/99 (!) 216/102 (!) 195/89 (!) 183/103  Pulse: 79 77 77 78  Resp:      Temp:      TempSrc:      SpO2: 96% 96% 98% 98%  Weight:      Height:         Vitals:   10/16/20 0019 10/16/20 0100 10/16/20 0130 10/16/20 0200  BP: (!) 206/99 (!) 216/102 (!) 195/89 (!) 183/103  Pulse: 79 77 77 78  Resp:      Temp:      TempSrc:       SpO2: 96% 96% 98% 98%  Weight:      Height:          Constitutional: Alert and oriented x 3 . Not in any apparent distress HEENT:      Head: Normocephalic and atraumatic.         Eyes: PERLA, EOMI, Conjunctivae are normal. Sclera is non-icteric.       Mouth/Throat: Mucous membranes are moist.       Neck: Supple with no signs of meningismus. Cardiovascular: Regular rate and rhythm. No murmurs, gallops, or rubs. 2+ symmetrical distal pulses are present . No JVD. No LE edema Respiratory: Respiratory effort normal .Lungs sounds clear bilaterally. No wheezes, crackles, or rhonchi.  Gastrointestinal: Soft, non tender, and non distended with positive bowel sounds.  Genitourinary: No CVA tenderness. Musculoskeletal: Nontender with normal range of motion in all extremities. No cyanosis, or erythema of extremities. Neurologic:  Face is symmetric. Moving all extremities. No gross focal neurologic deficits . Skin: Skin is warm, dry.  No rash or ulcers Psychiatric: Mood and affect are normal    Labs on Admission: I have personally reviewed following labs and imaging studies  CBC: Recent Labs  Lab 10/15/20 1542  WBC 8.1  HGB 12.7  HCT 36.9  MCV 82.6  PLT 144   Basic Metabolic Panel: Recent Labs  Lab 10/15/20 1542  NA 129*  K 3.8  CL 94*  CO2 27  GLUCOSE 181*  BUN 16  CREATININE 0.66  CALCIUM 9.5   GFR: Estimated Creatinine Clearance: 60.1 mL/min (by C-G formula based on SCr of 0.66 mg/dL). Liver Function Tests: Recent Labs  Lab 10/15/20 1542  AST 17  ALT 18  ALKPHOS 72  BILITOT 0.6  PROT 7.2  ALBUMIN 3.9   Recent Labs  Lab 10/15/20  1542  LIPASE 35   No results for input(s): AMMONIA in the last 168 hours. Coagulation Profile: No results for input(s): INR, PROTIME in the last 168 hours. Cardiac Enzymes: No results for input(s): CKTOTAL, CKMB, CKMBINDEX, TROPONINI in the last 168 hours. BNP (last 3 results) No results for input(s): PROBNP in the last 8760  hours. HbA1C: No results for input(s): HGBA1C in the last 72 hours. CBG: No results for input(s): GLUCAP in the last 168 hours. Lipid Profile: No results for input(s): CHOL, HDL, LDLCALC, TRIG, CHOLHDL, LDLDIRECT in the last 72 hours. Thyroid Function Tests: No results for input(s): TSH, T4TOTAL, FREET4, T3FREE, THYROIDAB in the last 72 hours. Anemia Panel: No results for input(s): VITAMINB12, FOLATE, FERRITIN, TIBC, IRON, RETICCTPCT in the last 72 hours. Urine analysis:    Component Value Date/Time   COLORURINE YELLOW (A) 10/15/2020 1544   APPEARANCEUR HAZY (A) 10/15/2020 1544   APPEARANCEUR Clear 05/22/2011 1011   LABSPEC 1.017 10/15/2020 1544   LABSPEC 1.009 05/22/2011 1011   PHURINE 7.0 10/15/2020 1544   GLUCOSEU 50 (A) 10/15/2020 1544   GLUCOSEU Negative 05/22/2011 1011   HGBUR NEGATIVE 10/15/2020 [place] 10/15/2020 1544   BILIRUBINUR Negative 05/22/2011 [place] 10/15/2020 1544   PROTEINUR 100 (A) 10/15/2020 1544   NITRITE NEGATIVE 10/15/2020 1544   LEUKOCYTESUR NEGATIVE 10/15/2020 1544   LEUKOCYTESUR Negative 05/22/2011 1011    Radiological Exams on Admission: CT HEAD WO CONTRAST (5MM)  Result Date: 10/15/2020 CLINICAL DATA:  Dizziness EXAM: CT HEAD WITHOUT CONTRAST TECHNIQUE: Contiguous axial images were obtained from the base of the skull through the vertex without intravenous contrast. COMPARISON:  02/04/2016 FINDINGS: Brain: No evidence of acute infarction, hemorrhage, hydrocephalus, extra-axial collection or mass lesion/mass effect. Subcortical white matter and periventricular small vessel ischemic changes. Vascular: Intracranial atherosclerosis. Skull: Normal. Negative for fracture or focal lesion. Sinuses/Orbits: The visualized paranasal sinuses are essentially clear. The mastoid air cells are unopacified. Other: None. IMPRESSION: No evidence of acute intracranial abnormality. Small vessel ischemic changes. Electronically Signed    By: Julian Hy M.D.   On: 10/15/2020 23:04     Assessment/Plan    Intractable nausea, NSAID gastritis versus diabetic gastroparesis   Epigastric pain  - Intractable nausea, epigastric pain, with history of daily NSAID use, weakly positive stool guaiac - Continue Protonix infusion started in the ED - IV antiemetics - N.p.o. except for ice chips and advance as tolerated - Trend hemoglobin --SCD for DVT prophylaxis for now    Hypertensive urgency - Systolic over 099 on arrival, likely related to inability to take meds due to intractable nausea --CT head non acute - IV hydralazine as needed to keep systolic under 833 - Resume oral antihypertensives when tolerating    Hyponatremia - IV hydration with normal saline    Type 2 diabetes mellitus with peripheral angiopathy (HCC) - Sliding scale insulin coverage    COPD (chronic obstructive pulmonary disease) ([place]) - DuoNebs as needed    DVT prophylaxis: SCD Code Status: full code  Family Communication:  none  Disposition Plan: Back to previous home environment Consults called: none  Status: Observation    Athena Masse MD [facility]     10/16/2020, 2:14 AM

## 2020-10-16 NOTE — Progress Notes (Signed)
C/o nausea, headache and upper abdominal pain.  No vomiting.  She wants to try a liquid diet and see how she does.  Resume home antihypertensives and continue current management.

## 2020-10-16 NOTE — Plan of Care (Signed)

## 2020-10-17 DIAGNOSIS — R112 Nausea with vomiting, unspecified: Secondary | ICD-10-CM | POA: Diagnosis not present

## 2020-10-17 DIAGNOSIS — E871 Hypo-osmolality and hyponatremia: Secondary | ICD-10-CM | POA: Diagnosis not present

## 2020-10-17 DIAGNOSIS — I160 Hypertensive urgency: Secondary | ICD-10-CM | POA: Diagnosis not present

## 2020-10-17 LAB — BASIC METABOLIC PANEL
Anion gap: 7 (ref 5–15)
BUN: 17 mg/dL (ref 8–23)
CO2: 25 mmol/L (ref 22–32)
Calcium: 8.8 mg/dL — ABNORMAL LOW (ref 8.9–10.3)
Chloride: 99 mmol/L (ref 98–111)
Creatinine, Ser: 0.86 mg/dL (ref 0.44–1.00)
GFR, Estimated: >60 mL/min (ref >60)
Glucose, Bld: 149 mg/dL — ABNORMAL HIGH (ref 70–99)
Potassium: 4.2 mmol/L (ref 3.5–5.1)
Sodium: 131 mmol/L — ABNORMAL LOW (ref 135–145)

## 2020-10-17 LAB — GLUCOSE, CAPILLARY
Glucose-Capillary: 114 mg/dL — ABNORMAL HIGH (ref 70–99)
Glucose-Capillary: 107 mg/dL — ABNORMAL HIGH (ref 70–99)
Glucose-Capillary: 133 mg/dL — ABNORMAL HIGH (ref 70–99)
Glucose-Capillary: 114 mg/dL — ABNORMAL HIGH (ref 70–99)

## 2020-10-17 MED ORDER — CARVEDILOL 12.5 MG PO TABS
12.5000 mg | ORAL_TABLET | Freq: Two times a day (BID) | ORAL | Status: DC
  Administered 2020-10-17 (×2): 12.5 mg via ORAL
  Filled 2020-10-17 (×2): qty 1

## 2020-10-17 MED ORDER — POLYETHYLENE GLYCOL 3350 17 G PO PACK
17.0000 g | PACK | Freq: Every day | ORAL | Status: DC
  Administered 2020-10-17: 17 g via ORAL
  Filled 2020-10-17: qty 1

## 2020-10-17 MED ORDER — CARVEDILOL 12.5 MG PO TABS: 12.5000 mg | ORAL_TABLET | Freq: Two times a day (BID) | ORAL | 0 refills | Status: AC

## 2020-10-17 MED ORDER — LORAZEPAM 1 MG PO TABS: 1.0000 mg | ORAL_TABLET | Freq: Every evening | ORAL | Status: AC | PRN

## 2020-10-17 MED ORDER — POTASSIUM CHLORIDE CRYS ER 20 MEQ PO TBCR
40.0000 meq | EXTENDED_RELEASE_TABLET | Freq: Once | ORAL | Status: AC
  Administered 2020-10-17: 40 meq via ORAL
  Filled 2020-10-17: qty 2

## 2020-10-17 NOTE — TOC Initial Note (Addendum)
Transition of Care [facility]) - Initial/Assessment Note    Patient Details  Name: Amber Welch MRN: [id number] Date of Birth: Dec 12, 1934  Transition of [facility]) CM/SW Contact:    Magnus Ivan, LCSW Phone Number: 10/17/2020, 2:02 PM  Clinical Narrative:                Spoke to patient regarding DC planning. 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 PCP is Dr. Sabra Heck. Patient has a RW, cane, chair lift into the home, and ramp. Patient states she is not interested in [facility], she feels comfortable returning home with self care. Daughters to transport home at time of DC. TOC was also consulted for resources for ALF/sitters. Patient says she is not interested in ALF. Asking about someone to come give her husband his medications if/when she is hospitalized. Explained HH options. Encouraged her to reach out to husband's PCP for guidance and additional resources.  Patient denies additional TOC needs at this time.   Expected Discharge Plan: Home/Self Care Barriers to Discharge: Barriers Resolved   Patient Goals and CMS Choice Patient states their goals for this hospitalization and ongoing recovery are:: home CMS Medicare.gov Compare Post Acute Care list provided to:: Patient Choice offered to / list presented to : Patient  Expected Discharge Plan and Services Expected Discharge Plan: Home/Self Care       Living arrangements for the past 2 months: Single Family Home                                      Prior Living Arrangements/Services Living arrangements for the past 2 months: Single Family Home Lives with:: Spouse Patient language and need for interpreter reviewed:: Yes Do you feel safe going back to the place where you live?: Yes      Need for Family Participation in Patient Care: Yes (Comment) Care giver support system in place?: Yes (comment) Current home services: DME Criminal Activity/Legal Involvement Pertinent to Current Situation/Hospitalization: No -  Comment as needed  Activities of Daily Living Home Assistive Devices/Equipment: Walker (specify type) ADL Screening (condition at time of admission) Patient's cognitive ability adequate to safely complete daily activities?: Yes Is the patient deaf or have difficulty hearing?: No Does the patient have difficulty seeing, even when wearing glasses/contacts?: No Does the patient have difficulty concentrating, remembering, or making decisions?: No Patient able to express need for assistance with ADLs?: Yes Does the patient have difficulty dressing or bathing?: No Independently performs ADLs?: Yes (appropriate for developmental age) Does the patient have difficulty walking or climbing stairs?: No Weakness of Legs: Both Weakness of Arms/Hands: None  Permission Sought/Granted                  Emotional Assessment       Orientation: : Oriented to Self, Oriented to Place, Oriented to  Time, Oriented to Situation Alcohol / Substance Use: Not Applicable Psych Involvement: No (comment)  Admission diagnosis:  Epigastric pain [R10.13] Hypertensive urgency [I16.0] Intractable nausea and vomiting [R11.2] Gastrointestinal hemorrhage, unspecified gastrointestinal hemorrhage type [K92.2] Acute gastritis with hemorrhage, unspecified gastritis type [K29.01] Nausea and vomiting, unspecified vomiting type [R11.2] Patient Active Problem List   Diagnosis Date Noted   Hypertensive urgency 10/16/2020   Intractable nausea and vomiting 10/16/2020   Epigastric pain 10/16/2020   GERD (gastroesophageal reflux disease) 02/17/2020   Type 2 diabetes mellitus with peripheral angiopathy ([place]) 07/24/2017  Spondylosis of cervical region without myelopathy or radiculopathy 11/10/2016   Low serum vitamin D 09/18/2016   Lumbar disc disease 07/19/2016   Hyponatremia 02/04/2016   History of smoking 07/16/2015   Morbid obesity (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

## 2020-10-17 NOTE — Plan of Care (Signed)
Pt flagged Yellow mews due to elevated SBP 230. Mews not implemented due to scheduled meds given and BP decreased and mews green. PRN hydralazine given and effect SBP 137. Pt denies nausea. Purewick in place. BS stable and did not require insulin. Protonix drip continues. Denies pain. Home CPAP on. No complaints at this time.  Problem: Education: Goal: Knowledge of General Education information will improve Description: Including pain rating scale, medication(s)/side effects and non-pharmacologic comfort measures Outcome: Progressing   Problem: Health Behavior/Discharge Planning: Goal: Ability to manage health-related needs will improve Outcome: Progressing   Problem: Clinical Measurements: Goal: Ability to maintain clinical measurements within normal limits will improve Outcome: Progressing Goal: Will remain free from infection Outcome: Progressing Goal: Diagnostic test results will improve Outcome: Progressing Goal: Respiratory complications will improve Outcome: Progressing Goal: Cardiovascular complication will be avoided Outcome: Progressing   Problem: Activity: Goal: Risk for activity intolerance will decrease Outcome: Progressing   Problem: Nutrition: Goal: Adequate nutrition will be maintained Outcome: Progressing   Problem: Coping: Goal: Level of anxiety will decrease Outcome: Progressing   Problem: Elimination: Goal: Will not experience complications related to bowel motility Outcome: Progressing Goal: Will not experience complications related to urinary retention Outcome: Progressing   Problem: Pain Managment: Goal: General experience of comfort will improve Outcome: Progressing   Problem: Safety: Goal: Ability to remain free from injury will improve Outcome: Progressing   Problem: Skin Integrity: Goal: Risk for impaired skin integrity will decrease Outcome: Progressing

## 2020-10-17 NOTE — Progress Notes (Addendum)
Scheduled meds given first. Then prn hydralazine given BP came down to 137/60. MEWs not implemented due to BP decreased with scheduled medication. Charge RN [employer] aware.   10/16/20 2033 10/16/20 2100 10/16/20 2127  Assess: MEWS Score  Temp 98.1 F (36.7 C)  --  97.8 F (36.6 C)  BP (!) 230/78  --  (!) 190/75  Pulse Rate 66  --  70  Resp 15  --  18  Level of Consciousness  --  Alert  --   SpO2 100 %  --  97 %  O2 Device Room Air  --  Room Air  Assess: MEWS Score  MEWS Temp 0 0 0  MEWS Systolic 2 2 0  MEWS Pulse 0 0 0  MEWS RR 0 0 0  MEWS LOC 0 0 0  MEWS Score 2 2 0  MEWS Score Color Yellow Yellow Green  Assess: if the MEWS score is Yellow or Red  Were vital signs taken at a resting state? Yes  --   --   Focused Assessment No change from prior assessment  --   --   Does the patient meet 2 or more of the SIRS criteria? No  --   --   MEWS guidelines implemented *See Row Information* No, vital signs rechecked  --   --   Treat  MEWS Interventions Administered scheduled meds/treatments  --  Administered prn meds/treatments  Pain Scale  --   --  0-10  Pain Score  --   --  0  Take Vital Signs  Increase Vital Sign Frequency   (gave scheduled Bp meds pressure came down)  --   --   Escalate  MEWS: Escalate Yellow: discuss with charge nurse/RN and consider discussing with provider and RRT  --   --   Notify: Charge Nurse/RN  Name of Charge Nurse/RN Notified Physiological scientist  --   --   Date Charge Nurse/RN Notified 10/16/20  --   --   Time Charge Nurse/RN Notified 2050  --   --   Document  Patient Outcome Stabilized after interventions  --   --   Assess: SIRS CRITERIA  SIRS Temperature  0  --  0  SIRS Pulse 0  --  0  SIRS Respirations  0  --  0  SIRS WBC 0  --  0  SIRS Score Sum  0  --  0

## 2020-10-18 LAB — HEMOGLOBIN A1C
Hgb A1c MFr Bld: 6.8 % — ABNORMAL HIGH (ref 4.8–5.6)
Mean Plasma Glucose: 148 mg/dL

## 2020-10-18 NOTE — Discharge Summary (Addendum)
Physician Discharge Summary  Amber Welch HLK:[id number] DOB: [birth year] DOA: 10/15/2020  PCP: Rusty Aus, MD  Admit date: 10/15/2020 Discharge date: 10/17/2020  Discharge disposition: Home   Recommendations for Outpatient Follow-Up:   Follow-up with PCP in 1 week   Discharge Diagnosis:   Active Problems:   COPD (chronic obstructive pulmonary disease) (HCC)   Hyponatremia   Type 2 diabetes mellitus with peripheral angiopathy (HCC)   Hypertensive urgency   Intractable nausea and vomiting   Epigastric pain    Discharge Condition: Stable.  Diet recommendation:  Diet Order             Diet - low sodium heart healthy                     Code Status: Prior     Hospital Course:   Ms. Amber Welch is an 86 year old woman with medical history significant for COPD, hypertension, type 2 diabetes mellitus, chronic hyponatremia, who presented to the hospital, from her PCPs office, because of headache, generalized weakness, nausea, vomiting and elevated blood pressure.  She was found to have hypertensive urgency and acute on chronic hyponatremia.  She was treated with IV fluids, antiemetics and antihypertensives.  She developed hypokalemia that was treated.  Her condition improved and she was able to tolerate a regular diet.  She was also able to ambulate with a walker in the hospital with a support from the nursing staff.  She is deemed stable for discharge to home.     Discharge Exam:    Vitals:   10/16/20 2127 10/16/20 2346 10/17/20 0502 10/17/20 0828  BP: (!) 190/75 137/60 (!) 159/59 (!) 169/66  Pulse: 70 69 70 73  Resp: 18 18 16 12   Temp: 97.8 F (36.6 C) 97.9 F (36.6 C) 97.6 F (36.4 C) 98.4 F (36.9 C)  TempSrc: Oral Oral Oral Oral  SpO2: 97% 99% 98% 97%  Weight:      Height:         GEN: NAD SKIN: Warm and dry EYES: EOMI, no pallor or icterus ENT: MMM CV: RRR PULM: CTA B ABD: soft, ND, NT, +BS CNS: AAO x 3, non focal EXT: No edema or  tenderness   The results of significant diagnostics from this hospitalization (including imaging, microbiology, ancillary and laboratory) are listed below for reference.     Procedures and Diagnostic Studies:   CT HEAD WO CONTRAST (5MM)  Result Date: 10/15/2020 CLINICAL DATA:  Dizziness EXAM: CT HEAD WITHOUT CONTRAST TECHNIQUE: Contiguous axial images were obtained from the base of the skull through the vertex without intravenous contrast. COMPARISON:  02/04/2016 FINDINGS: Brain: No evidence of acute infarction, hemorrhage, hydrocephalus, extra-axial collection or mass lesion/mass effect. Subcortical white matter and periventricular small vessel ischemic changes. Vascular: Intracranial atherosclerosis. Skull: Normal. Negative for fracture or focal lesion. Sinuses/Orbits: The visualized paranasal sinuses are essentially clear. The mastoid air cells are unopacified. Other: None. IMPRESSION: No evidence of acute intracranial abnormality. Small vessel ischemic changes. Electronically Signed   By: Julian Hy M.D.   On: 10/15/2020 23:04     Labs:   Basic Metabolic Panel: Recent Labs  Lab 10/15/20 1542 10/16/20 1012 10/17/20 1409  NA 129* 130* 131*  K 3.8 3.1* 4.2  CL 94* 96* 99  CO2 27 26 25   GLUCOSE 181* 120* 149*  BUN 16 15 17   CREATININE 0.66 0.57 0.86  CALCIUM 9.5 8.9 8.8*   GFR Estimated Creatinine Clearance: 55.9 mL/min (by C-G formula  based on SCr of 0.86 mg/dL). Liver Function Tests: Recent Labs  Lab 10/15/20 1542  AST 17  ALT 18  ALKPHOS 72  BILITOT 0.6  PROT 7.2  ALBUMIN 3.9   Recent Labs  Lab 10/15/20 1542  LIPASE 35   No results for input(s): AMMONIA in the last 168 hours. Coagulation profile No results for input(s): INR, PROTIME in the last 168 hours.  CBC: Recent Labs  Lab 10/15/20 1542 10/16/20 0332  WBC 8.1 9.7  HGB 12.7 12.3  HCT 36.9 35.4*  MCV 82.6 82.3  PLT 354 341   Cardiac Enzymes: No results for input(s): CKTOTAL, CKMB,  CKMBINDEX, TROPONINI in the last 168 hours. BNP: Invalid input(s): POCBNP CBG: Recent Labs  Lab 10/16/20 2354 10/17/20 0459 10/17/20 0831 10/17/20 1210 10/17/20 1723  GLUCAP 103* 114* 107* 133* 114*   D-Dimer No results for input(s): DDIMER in the last 72 hours. Hgb A1c No results for input(s): HGBA1C in the last 72 hours. Lipid Profile No results for input(s): CHOL, HDL, LDLCALC, TRIG, CHOLHDL, LDLDIRECT in the last 72 hours. Thyroid function studies No results for input(s): TSH, T4TOTAL, T3FREE, THYROIDAB in the last 72 hours.  Invalid input(s): FREET3 Anemia work up No results for input(s): VITAMINB12, FOLATE, FERRITIN, TIBC, IRON, RETICCTPCT in the last 72 hours. Microbiology Recent Results (from the past 240 hour(s))  Resp Panel by RT-PCR (Flu A&B, Covid) Nasopharyngeal Swab     Status: None   Collection Time: 10/16/20  3:32 AM   Specimen: Nasopharyngeal Swab; Nasopharyngeal(NP) swabs in vial transport medium  Result Value Ref Range Status   SARS Coronavirus 2 by RT PCR NEGATIVE NEGATIVE Final    Comment: (NOTE) SARS-CoV-2 target nucleic acids are NOT DETECTED.  The SARS-CoV-2 RNA is generally detectable in upper respiratory specimens during the acute phase of infection. The lowest concentration of SARS-CoV-2 viral copies this assay can detect is 138 copies/mL. A negative result does not preclude SARS-Cov-2 infection and should not be used as the sole basis for treatment or other patient management decisions. A negative result may occur with  improper specimen collection/handling, submission of specimen other than nasopharyngeal swab, presence of viral mutation(s) within the areas targeted by this assay, and inadequate number of viral copies(<138 copies/mL). A negative result must be combined with clinical observations, patient history, and epidemiological information. The expected result is Negative.  Fact Sheet for Patients:   EntrepreneurPulse.com.au  Fact Sheet for Healthcare Providers:  IncredibleEmployment.be  This test is no t yet approved or cleared by the [place] FDA and  has been authorized for detection and/or diagnosis of SARS-CoV-2 by FDA under an Emergency Use Authorization (EUA). This EUA will remain  in effect (meaning this test can be used) for the duration of the COVID-19 declaration under Section 564(b)(1) of the Act, 21 U.S.C.section 360bbb-3(b)(1), unless the authorization is terminated  or revoked sooner.       Influenza A by PCR NEGATIVE NEGATIVE Final   Influenza B by PCR NEGATIVE NEGATIVE Final    Comment: (NOTE) The Xpert Xpress SARS-CoV-2/FLU/RSV plus assay is intended as an aid in the diagnosis of influenza from Nasopharyngeal swab specimens and should not be used as a sole basis for treatment. Nasal washings and aspirates are unacceptable for Xpert Xpress SARS-CoV-2/FLU/RSV testing.  Fact Sheet for Patients: EntrepreneurPulse.com.au  Fact Sheet for Healthcare Providers: IncredibleEmployment.be  This test is not yet approved or cleared by the [place] FDA and has been authorized for detection and/or diagnosis of SARS-CoV-2 by FDA  under an Emergency Use Authorization (EUA). This EUA will remain in effect (meaning this test can be used) for the duration of the COVID-19 declaration under Section 564(b)(1) of the Act, 21 U.S.C. section 360bbb-3(b)(1), unless the authorization is terminated or revoked.  Performed at [facility], [address], [place], [place] [address]      Discharge Instructions:   Discharge Instructions     Diet - low sodium heart healthy   Complete by: As directed    Increase activity slowly   Complete by: As directed       Allergies as of 10/17/2020       Reactions   Penicillins Swelling, Rash, Other (See Comments)   Pt. Turn blue Has patient  had a PCN reaction causing immediate rash, facial/tongue/throat swelling, [name], then may proceed with Cephalosporin use.   Ace Inhibitors Other (See Comments)   Wasn't sleeping well   Cefuroxime Axetil Diarrhea, Other (See Comments)   Celebrex [celecoxib] Cough   Statins Other (See Comments)   Joint pain   Iodinated Diagnostic Agents Swelling, Rash   Swelling of the ankles   Norpramin [desipramine] Rash   Phenobarbital Swelling, Rash   Swelling of the ankles   Sinequan [doxepin] Rash        Medication List     STOP taking these medications    ALPRAZolam 0.5 MG tablet Commonly known as: XANAX   cyanocobalamin 1000 MCG tablet   famotidine 20 MG tablet Commonly known as: PEPCID   meclizine 25 MG tablet Commonly known as: ANTIVERT   Melatonin 5 MG Caps       TAKE these medications    albuterol 108 (90 Base) MCG/ACT inhaler Commonly known as: Ventolin HFA Inhale 2 puffs into the lungs every 4 (four) hours as needed for wheezing. UAD   aspirin 81 MG tablet Take 81 mg by mouth daily.   ASTEPRO NA Place 1 spray into the nose at bedtime as needed (congestion).   Azilsartan Medoxomil 80 MG Tabs Take 1 tablet by mouth daily.   carvedilol 12.5 MG tablet Commonly known as: COREG Take 1 tablet (12.5 mg total) by mouth 2 (two) times daily with a meal. What changed:  medication strength how much to take when to take this   Cholecalciferol 50 MCG (2000 UT) Tabs Take by mouth.   docusate sodium 100 MG capsule Commonly known as: COLACE Take 100 mg by mouth 2 (two) times daily.   esomeprazole 40 MG capsule Commonly known as: NEXIUM Take 40 mg by mouth daily  before breakfast.   ferrous gluconate 324 MG tablet Commonly known as: FERGON Take by mouth daily.   Fluticasone-Salmeterol 100-50 MCG/DOSE Aepb Commonly known as: ADVAIR USE 1 PUFF EVERY 12 HOURS AS DIRECTED. RINSE MOUTH AFTER USING.   gabapentin 300 MG capsule Commonly known as: NEURONTIN Take 2 capsules by mouth at bedtime. What changed: Another medication with the same name was removed. Continue taking this medication, and follow the directions you see here.   gabapentin 300 MG capsule Commonly known as: NEURONTIN Take 300-600 mg by mouth See admin instructions. Take 300 mg by mouth in the morning, 300 mg at noon, and 600 mg at bedtime.  What changed: Another medication with the same name was removed. Continue taking this medication, and follow the directions you see here.   hydrALAZINE 25 MG tablet Commonly known as: APRESOLINE Take 1 tablet by mouth 2 (two) times daily.   LORazepam 1 MG tablet Commonly known as: ATIVAN Take 1 tablet (1 mg total) by mouth at bedtime as needed for anxiety. What changed:  when to take this reasons to take this   magnesium gluconate 500 MG tablet Commonly known as: MAGONATE Take 500 mg by mouth daily.   metFORMIN 500 MG tablet Commonly known as: GLUCOPHAGE Take 500 mg by mouth daily.   metroNIDAZOLE 0.75 % gel Commonly known as: METROGEL Apply topically 2 (two) times daily.   mometasone 50 MCG/ACT nasal spray Commonly known as: NASONEX Place 2 sprays into the nose daily as needed (allergies).   ondansetron 4 MG disintegrating tablet Commonly known as: Zofran ODT Take 1 tablet (4 mg total) by mouth every 8 (eight) hours as needed for nausea or vomiting.   pravastatin 20 MG tablet Commonly known as: PRAVACHOL Take 20 mg by mouth daily.   Precision QID Test test strip Generic drug: glucose blood 1 each (1 strip total) 2 (two) times daily Use as instructed.           If you experience worsening of your admission  symptoms, develop shortness of breath, life threatening emergency, suicidal or homicidal thoughts you must seek medical attention immediately by calling 911 or calling your MD immediately  if symptoms less severe.   You must read complete instructions/literature along with all the possible adverse reactions/side effects for all the medicines you take and that have been prescribed to you. Take any new medicines after you have completely understood and accept all the possible adverse reactions/side effects.    Please note   You were cared for by a hospitalist during your hospital stay. If you have any questions about your discharge medications or the care you received while you were in the hospital after you are discharged, you can call the unit and asked to speak with the hospitalist on call if the hospitalist that took care of you is not available. Once you are discharged, your primary care physician will handle any further medical issues. Please note that NO REFILLS for any discharge medications will be authorized once you are discharged, as it is imperative that you return to your primary care physician (or establish a relationship with a primary care physician if you do not have one) for your aftercare needs so that they can reassess your need for medications and monitor your lab values.       Time coordinating discharge: 31 minutes  Signed:  Dierks Wach  [facility] 10/18/2020, 7:24 AM   Pager on [email or web address]. If 7PM-7AM, please contact night-coverage at [email or web address]

## 2020-12-07 ENCOUNTER — Ambulatory Visit
Admission: RE | Admit: 2020-12-07 | Discharge: 2020-12-07 | Disposition: A | Discharge status: Home / Self Care | Payer: Medicare PPO | Source: Ambulatory Visit | Attending: Internal Medicine | Admitting: Internal Medicine

## 2020-12-07 ENCOUNTER — Other Ambulatory Visit: Payer: Self-pay

## 2020-12-07 DIAGNOSIS — Z1231 Encounter for screening mammogram for malignant neoplasm of breast: Secondary | ICD-10-CM | POA: Diagnosis present

## 2021-09-19 ENCOUNTER — Other Ambulatory Visit: Payer: Self-pay | Admitting: Physician Assistant

## 2021-09-19 ENCOUNTER — Ambulatory Visit
Admission: RE | Admit: 2021-09-19 | Discharge: 2021-09-19 | Disposition: A | Discharge status: Home / Self Care | Payer: Medicare PPO | Source: Ambulatory Visit | Attending: Physician Assistant | Admitting: Physician Assistant

## 2021-09-19 DIAGNOSIS — M7989 Other specified soft tissue disorders: Secondary | ICD-10-CM | POA: Insufficient documentation

## 2021-10-05 ENCOUNTER — Encounter: Payer: Self-pay | Admitting: Ophthalmology

## 2021-10-10 NOTE — Discharge Instructions (Signed)

## 2021-10-12 ENCOUNTER — Encounter: Payer: Self-pay | Admitting: Ophthalmology

## 2021-10-12 ENCOUNTER — Ambulatory Visit: Payer: Medicare PPO | Admitting: Anesthesiology

## 2021-10-12 ENCOUNTER — Ambulatory Visit
Admission: RE | Admit: 2021-10-12 | Discharge: 2021-10-12 | Disposition: A | Discharge status: Home / Self Care | Payer: Medicare PPO | Source: Ambulatory Visit | Attending: Ophthalmology | Admitting: Ophthalmology

## 2021-10-12 ENCOUNTER — Other Ambulatory Visit: Payer: Self-pay

## 2021-10-12 ENCOUNTER — Encounter
Admission: RE | Disposition: A | Discharge status: Home / Self Care | Payer: Self-pay | Source: Ambulatory Visit | Attending: Ophthalmology

## 2021-10-12 DIAGNOSIS — E1136 Type 2 diabetes mellitus with diabetic cataract: Secondary | ICD-10-CM | POA: Diagnosis present

## 2021-10-12 DIAGNOSIS — Z96653 Presence of artificial knee joint, bilateral: Secondary | ICD-10-CM | POA: Insufficient documentation

## 2021-10-12 DIAGNOSIS — G4733 Obstructive sleep apnea (adult) (pediatric): Secondary | ICD-10-CM | POA: Insufficient documentation

## 2021-10-12 DIAGNOSIS — E785 Hyperlipidemia, unspecified: Secondary | ICD-10-CM | POA: Diagnosis not present

## 2021-10-12 DIAGNOSIS — E1140 Type 2 diabetes mellitus with diabetic neuropathy, unspecified: Secondary | ICD-10-CM | POA: Diagnosis not present

## 2021-10-12 DIAGNOSIS — Z6833 Body mass index (BMI) 33.0-33.9, adult: Secondary | ICD-10-CM | POA: Insufficient documentation

## 2021-10-12 DIAGNOSIS — K219 Gastro-esophageal reflux disease without esophagitis: Secondary | ICD-10-CM | POA: Diagnosis not present

## 2021-10-12 DIAGNOSIS — I739 Peripheral vascular disease, unspecified: Secondary | ICD-10-CM | POA: Diagnosis not present

## 2021-10-12 DIAGNOSIS — H2512 Age-related nuclear cataract, left eye: Secondary | ICD-10-CM | POA: Diagnosis not present

## 2021-10-12 DIAGNOSIS — J449 Chronic obstructive pulmonary disease, unspecified: Secondary | ICD-10-CM | POA: Insufficient documentation

## 2021-10-12 DIAGNOSIS — I10 Essential (primary) hypertension: Secondary | ICD-10-CM | POA: Diagnosis not present

## 2021-10-12 DIAGNOSIS — E1151 Type 2 diabetes mellitus with diabetic peripheral angiopathy without gangrene: Secondary | ICD-10-CM

## 2021-10-12 HISTORY — DX: Dizziness and giddiness: R42

## 2021-10-12 HISTORY — PX: CATARACT EXTRACTION W/PHACO: SHX586

## 2021-10-12 HISTORY — DX: Gastro-esophageal reflux disease without esophagitis: K21.9

## 2021-10-12 LAB — GLUCOSE, CAPILLARY
Glucose-Capillary: 129 mg/dL — ABNORMAL HIGH (ref 70–99)
Glucose-Capillary: 128 mg/dL — ABNORMAL HIGH (ref 70–99)

## 2021-10-12 SURGERY — PHACOEMULSIFICATION, CATARACT, WITH IOL INSERTION
Anesthesia: Monitor Anesthesia Care | Site: Eye | Laterality: Left

## 2021-10-12 MED ORDER — SIGHTPATH DOSE#1 BSS IO SOLN
INTRAOCULAR | Status: DC | PRN
  Administered 2021-10-12: 15 mL

## 2021-10-12 MED ORDER — SIGHTPATH DOSE#1 BSS IO SOLN
INTRAOCULAR | Status: DC | PRN
  Administered 2021-10-12: 1 mL via INTRAMUSCULAR

## 2021-10-12 MED ORDER — MIDAZOLAM HCL 2 MG/2ML IJ SOLN
INTRAMUSCULAR | Status: DC | PRN
  Administered 2021-10-12: 1 mg via INTRAVENOUS

## 2021-10-12 MED ORDER — FENTANYL CITRATE (PF) 100 MCG/2ML IJ SOLN
INTRAMUSCULAR | Status: DC | PRN
  Administered 2021-10-12: 50 ug via INTRAVENOUS

## 2021-10-12 MED ORDER — MOXIFLOXACIN HCL 0.5 % OP SOLN
OPHTHALMIC | Status: DC | PRN
  Administered 2021-10-12: 0.2 mL via OPHTHALMIC

## 2021-10-12 MED ORDER — ARMC OPHTHALMIC DILATING DROPS
1.0000 | OPHTHALMIC | Status: DC | PRN
Start: 2021-10-12 — End: 2021-10-12
  Administered 2021-10-12 (×3): 1 via OPHTHALMIC

## 2021-10-12 MED ORDER — TETRACAINE HCL 0.5 % OP SOLN
1.0000 [drp] | OPHTHALMIC | Status: DC | PRN
Start: 2021-10-12 — End: 2021-10-12
  Administered 2021-10-12 (×3): 1 [drp] via OPHTHALMIC

## 2021-10-12 MED ORDER — BRIMONIDINE TARTRATE-TIMOLOL 0.2-0.5 % OP SOLN
OPHTHALMIC | Status: DC | PRN
  Administered 2021-10-12: 1 [drp] via OPHTHALMIC

## 2021-10-12 MED ORDER — SIGHTPATH DOSE#1 NA HYALUR & NA CHOND-NA HYALUR IO KIT
PACK | INTRAOCULAR | Status: DC | PRN
  Administered 2021-10-12: 1 via OPHTHALMIC

## 2021-10-12 MED ORDER — SIGHTPATH DOSE#1 BSS IO SOLN
INTRAOCULAR | Status: DC | PRN
  Administered 2021-10-12: 65 mL via OPHTHALMIC

## 2021-10-12 SURGICAL SUPPLY — 11 items
CATARACT SUITE SIGHTPATH (MISCELLANEOUS) ×1 IMPLANT
FEE CATARACT SUITE SIGHTPATH (MISCELLANEOUS) ×1 IMPLANT
GLOVE SRG 8 PF TXTR STRL LF DI (GLOVE) ×1 IMPLANT
GLOVE SURG ENC TEXT LTX SZ7.5 (GLOVE) ×1 IMPLANT
GLOVE SURG UNDER POLY LF SZ8 (GLOVE) ×1
LENS CLAREON VIVITY IOL 25.0 ×1 IMPLANT
LENS IOL CLRN VT YLW 25.0 IMPLANT
NDL FILTER BLUNT 18X1 1/2 (NEEDLE) ×1 IMPLANT
NEEDLE FILTER BLUNT 18X1 1/2 (NEEDLE) ×1 IMPLANT
SYR 3ML LL SCALE MARK (SYRINGE) ×1 IMPLANT
WATER STERILE IRR 250ML POUR (IV SOLUTION) ×1 IMPLANT

## 2021-10-12 NOTE — Op Note (Signed)
OPERATIVE NOTE  Amber Welch [id number] 10/12/2021   PREOPERATIVE DIAGNOSIS:  Nuclear sclerotic cataract left eye. H25.12   POSTOPERATIVE DIAGNOSIS:    Nuclear sclerotic cataract left eye.     PROCEDURE:  Phacoemusification with posterior chamber intraocular lens placement of the left eye  Ultrasound time: Procedure(s) with comments: CATARACT EXTRACTION PHACO AND INTRAOCULAR LENS PLACEMENT (IOC) LEFT CLAREON VITITY (Left) - Diabetic 5.85 00:53.4  LENS:   Implant Name Type Inv. Item Serial No. Manufacturer Lot No. LRB No. Used Action  LENS CLAREON VIVITY IOL 25.0 - [id number]  LENS CLAREON VIVITY IOL 25.0 [id number] [employer]  Left 1 Implanted      SURGEON:  Wyonia Hough, MD   ANESTHESIA:  Topical with tetracaine drops and 2% Xylocaine jelly, augmented with 1% preservative-free intracameral lidocaine.    COMPLICATIONS:  None.   DESCRIPTION OF PROCEDURE:  The patient was identified in the holding room and transported to the operating room and placed in the supine position under the operating microscope.  The left eye was identified as the operative eye and it was prepped and draped in the usual sterile ophthalmic fashion.   A 1 millimeter clear-corneal paracentesis was made at the 1:30 position.  0.5 ml of preservative-free 1% lidocaine was injected into the anterior chamber.  The anterior chamber was filled with Viscoat viscoelastic.  A 2.4 millimeter keratome was used to make a near-clear corneal incision at the 10:30 position.  .  A curvilinear capsulorrhexis was made with a cystotome and capsulorrhexis forceps.  Balanced salt solution was used to hydrodissect and hydrodelineate the nucleus.   Phacoemulsification was then used in stop and chop fashion to remove the lens nucleus and epinucleus.  The remaining cortex was then removed using the irrigation and aspiration handpiece. Provisc was then placed into the capsular bag to distend it for lens placement.  A lens was  then injected into the capsular bag.  The remaining viscoelastic was aspirated.   Wounds were hydrated with balanced salt solution.  The anterior chamber was inflated to a physiologic pressure with balanced salt solution.  No wound leaks were noted. Cefuroxime 0.1 ml of a '10mg'$ /ml solution was injected into the anterior chamber for a dose of 1 mg of intracameral antibiotic at the completion of the case.   Timolol and Brimonidine drops were applied to the eye.  The patient was taken to the recovery room in stable condition without complications of anesthesia or surgery.  [name] 10/12/2021, 9:23 AM

## 2021-10-12 NOTE — Anesthesia Postprocedure Evaluation (Signed)
Anesthesia Post Note  Patient: Amber Welch  Procedure(s) Performed: CATARACT EXTRACTION PHACO AND INTRAOCULAR LENS PLACEMENT (IOC) LEFT CLAREON VITITY (Left: Eye)  Patient location during evaluation: PACU Anesthesia Type: MAC Level of consciousness: awake and alert Pain management: pain level controlled Vital Signs Assessment: post-procedure vital signs reviewed and stable Respiratory status: spontaneous breathing, nonlabored ventilation and respiratory function stable Cardiovascular status: blood pressure returned to baseline and stable Postop Assessment: no apparent nausea or vomiting Anesthetic complications: no   There were no known notable events for this encounter.   Last Vitals:  Vitals:   10/12/21 0926 10/12/21 0930  BP: (!) 163/69 (!) 168/63  Pulse: 60 (!) 58  Resp: 12 12  Temp: (!) 36.4 C   SpO2: 99% 94%    Last Pain:  Vitals:   10/12/21 0930  TempSrc:   PainSc: 0-No pain                 Iran Ouch

## 2021-10-12 NOTE — H&P (Signed)
[place]   Primary Care Physician:  Rusty Aus, MD Ophthalmologist: Dr. Leandrew Koyanagi  Pre-Procedure History & Physical: HPI:  Amber Welch is a 87 y.o. female here for ophthalmic surgery.   Past Medical History:  Diagnosis Date   Anxiety    Arthritis    Back and knees   Asthma    a. chronic SOB   Carotid artery disease ([place])    a. carotid doppler 2016: 21% RICA, <19% LICA   COPD (chronic obstructive pulmonary disease) (HCC)    Depression    Diabetes type 2, controlled ([place])    GERD (gastroesophageal reflux disease)    Hyperlipidemia    Insomnia    Labile hypertension    a. when she is stressed BP will increase to the 417'E systolic   Medication intolerance    Morbid obesity ([place])    Normal coronary arteries    a. normal cath 2003; b. normal Myoview in 2008 & 2009   OSA (obstructive sleep apnea)    a. BiPAP   Physical deconditioning    Vertigo    after root canal    Past Surgical History:  Procedure Laterality Date   COLONOSCOPY WITH PROPOFOL N/A 07/31/2014   Procedure: COLONOSCOPY WITH PROPOFOL;  Surgeon: Hulen Luster, MD;  Location: [facility] ENDOSCOPY;  Service: Gastroenterology;  Laterality: N/A;   DILATION AND CURETTAGE OF UTERUS     FOOT SURGERY     Left foot, bunionectomy and hammer toe   JOINT REPLACEMENT  06/05/11   Right knee replaced   JOINT REPLACEMENT  09/20/11   Left knee   right foot surgery Right April 2015   Right Foot and Ankle   right knee surgery     arthroscopy for torn meniscus    Prior to Admission medications   Medication Sig Start Date End Date Taking? Authorizing Provider  albuterol (VENTOLIN HFA) 108 (90 Base) MCG/ACT inhaler Inhale 2 puffs into the lungs every 4 (four) hours as needed for wheezing. UAD 01/08/15  Yes Flora Lipps, MD  aspirin 81 MG tablet Take 81 mg by mouth daily.   Yes [provider]  Azelastine HCl (ASTEPRO NA) Place 1 spray into the nose at bedtime as needed (congestion).    Yes [provider]  Azilsartan Medoxomil 80 MG TABS Take 1 tablet by mouth daily.    Yes [provider]  carvedilol (COREG) 12.5 MG tablet Take 1 tablet (12.5 mg total) by mouth 2 (two) times daily with a meal. Patient taking differently: Take 6.25 mg by mouth 2 (two) times daily with a meal. 10/17/20  Yes Jennye Boroughs, MD  Cholecalciferol 50 MCG (2000 UT) TABS Take by mouth.   Yes [provider]  docusate sodium (COLACE) 100 MG capsule Take 100 mg by mouth 2 (two) times daily.    Yes [provider]  esomeprazole (NEXIUM) 40 MG capsule Take 40 mg by mouth daily before breakfast.     Yes [provider]  Fluticasone-Salmeterol (ADVAIR) 100-50 MCG/DOSE AEPB USE 1 PUFF EVERY 12 HOURS AS DIRECTED. RINSE MOUTH AFTER USING. 03/12/19  Yes [provider]  gabapentin (NEURONTIN) 300 MG capsule Take 300-600 mg by mouth See admin instructions. Take 300 mg by mouth in the morning, 300 mg at noon, and 300 mg at bedtime. 05/04/20  Yes [provider]  hydrALAZINE (APRESOLINE) 25 MG tablet Take 25 mg by mouth 3 (three) times daily.   Yes [provider]  LORazepam (ATIVAN) 1  MG tablet Take 1 tablet (1 mg total) by mouth at bedtime as needed for anxiety. 10/17/20  Yes Jennye Boroughs, MD  magnesium gluconate (MAGONATE) 500 MG tablet Take 500 mg by mouth daily.   Yes [provider]  metFORMIN (GLUCOPHAGE) 500 MG tablet Take 500 mg by mouth daily.     Yes [provider]  metroNIDAZOLE (METROGEL) 0.75 % gel Apply topically 2 (two) times daily. 08/19/19  Yes [provider]  mometasone (NASONEX) 50 MCG/ACT nasal spray Place 2 sprays into the nose daily as needed (allergies).    Yes [provider]  pravastatin (PRAVACHOL) 20 MG tablet Take 20 mg by mouth daily.  01/31/18  Yes [provider]    Allergies as of 08/16/2021 - Review Complete 10/16/2020  Allergen Reaction Noted   Penicillins Swelling, Rash, and  Other (See Comments)    Ace inhibitors Other (See Comments) 07/30/2014   Cefuroxime axetil Diarrhea and Other (See Comments) 02/07/2016   Celebrex [celecoxib] Cough 07/30/2014   Statins Other (See Comments) 07/30/2014   Iodinated contrast media Swelling and Rash 07/30/2014   Norpramin [desipramine] Rash 07/30/2014   Phenobarbital Swelling and Rash    Sinequan [doxepin] Rash 07/30/2014    Family History  Problem Relation Age of Onset   Heart disease Father        Heart Disease before age 72   Diabetes Father    Hyperlipidemia Father    Heart attack Father    Heart disease Mother    Hypertension Mother    Heart attack Mother    Breast cancer Cousin 62    Social History   Socioeconomic History   Marital status: Married    Spouse name: Not on file   Number of children: Not on file   Years of education: Not on file   Highest education level: Not on file  Occupational History   Occupation: retired  Tobacco Use   Smoking status: Never   Smokeless tobacco: Never   Tobacco comments:    tobacco use - no  Vaping Use   Vaping Use: Never used  Substance and Sexual Activity   Alcohol use: No    Alcohol/week: 0.0 standard drinks of alcohol   Drug use: No   Sexual activity: Not on file  Other Topics Concern   Not on file  Social History Narrative   Retired, married does not get regular exercise.    Social Determinants of Health   Financial Resource Strain: Not on file  Food Insecurity: Not on file  Transportation Needs: Not on file  Physical Activity: Not on file  Stress: Not on file  Social Connections: Not on file  Intimate Partner Violence: Not on file    Review of Systems: See HPI, otherwise negative ROS  Physical Exam: BP (!) 196/64   Temp (!) 97.2 F (36.2 C) (Tympanic)   Ht '5\' 5"'$  (1.651 m)   Wt 92.2 kg   SpO2 97%   BMI 33.83 kg/m  General:   Alert,  pleasant and cooperative in NAD Head:  Normocephalic and atraumatic. Lungs:  Clear to auscultation.     Heart:  Regular rate and rhythm.   Impression/Plan: Amber Welch is here for ophthalmic surgery.  Risks, benefits, limitations, and alternatives regarding ophthalmic surgery have been reviewed with the patient.  Questions have been answered.  All parties agreeable.   Leandrew Koyanagi, MD  10/12/2021, 8:34 AM

## 2021-10-12 NOTE — Anesthesia Preprocedure Evaluation (Addendum)
Anesthesia Evaluation  Patient identified by MRN, date of birth, ID band Patient awake    Reviewed: Allergy & Precautions, NPO status , Patient's Chart, lab work & pertinent test results  Airway Mallampati: III  TM Distance: >3 FB Neck ROM: full    Dental  (+) Poor Dentition   Pulmonary shortness of breath, asthma , sleep apnea , COPD,    Pulmonary exam normal        Cardiovascular Exercise Tolerance: Poor hypertension, + Peripheral Vascular Disease  Normal cardiovascular exam  Carotid artery disease   Neuro/Psych PSYCHIATRIC DISORDERS Anxiety  Neuromuscular disease (diabetic neuropathy)    GI/Hepatic Neg liver ROS, GERD  ,  Endo/Other  diabetes, Type 2  Renal/GU      Musculoskeletal   Abdominal Normal abdominal exam  (+)   Peds  Hematology negative hematology ROS (+)   Anesthesia Other Findings Past Medical History: No date: Anxiety No date: Arthritis     Comment:  Back and knees No date: Asthma     Comment:  a. chronic SOB No date: Carotid artery disease ([place])     Comment:  a. carotid doppler 2016: 02% RICA, <58% LICA No date: COPD (chronic obstructive pulmonary disease) (HCC) No date: Depression No date: Diabetes type 2, controlled ([place]) No date: GERD (gastroesophageal reflux disease) No date: Hyperlipidemia No date: Insomnia No date: Labile hypertension     Comment:  a. when she is stressed BP will increase to the 200's               systolic No date: Medication intolerance No date: Morbid obesity ([place]) No date: Normal coronary arteries     Comment:  a. normal cath 2003; b. normal Myoview in 2008 & 2009 No date: OSA (obstructive sleep apnea)     Comment:  a. BiPAP No date: Physical deconditioning No date: Vertigo     Comment:  after root canal  Past Surgical History: 07/31/2014: COLONOSCOPY WITH PROPOFOL; N/A     Comment:  Procedure: COLONOSCOPY WITH PROPOFOL;  Surgeon: Hulen Luster,  MD;  Location: [facility] ENDOSCOPY;  Service:               Gastroenterology;  Laterality: N/A; No date: DILATION AND CURETTAGE OF UTERUS No date: FOOT SURGERY     Comment:  Left foot, bunionectomy and hammer toe 06/05/11: JOINT REPLACEMENT     Comment:  Right knee replaced 09/20/11: JOINT REPLACEMENT     Comment:  Left knee April 2015: right foot surgery; Right     Comment:  Right Foot and Ankle No date: right knee surgery     Comment:  arthroscopy for torn meniscus  BMI    Body Mass Index: 36.61 kg/m      Reproductive/Obstetrics negative OB ROS                            Anesthesia Physical Anesthesia Plan  ASA: 3  Anesthesia Plan: MAC   Post-op Pain Management:    Induction:   PONV Risk Score and Plan:   Airway Management Planned: Natural Airway  Additional Equipment:   Intra-op Plan:   Post-operative Plan:   Informed Consent: I have reviewed the patients History and Physical, chart, labs and discussed the procedure including the risks, benefits and alternatives for the proposed anesthesia with the patient or authorized representative who has indicated his/her understanding and  acceptance.     Dental advisory given  Plan Discussed with: Anesthesiologist, CRNA and Surgeon  Anesthesia Plan Comments:        Anesthesia Quick Evaluation

## 2021-10-12 NOTE — Transfer of Care (Signed)
Immediate Anesthesia Transfer of Care Note  Patient: Amber Welch  Procedure(s) Performed: CATARACT EXTRACTION PHACO AND INTRAOCULAR LENS PLACEMENT ([place]) LEFT CLAREON VITITY (Left: Eye)  Patient Location: PACU  Anesthesia Type: MAC  Level of Consciousness: awake, alert  and patient cooperative  Airway and Oxygen Therapy: Patient Spontanous Breathing and Patient connected to supplemental oxygen  Post-op Assessment: Post-op Vital signs reviewed, Patient's Cardiovascular Status Stable, Respiratory Function Stable, Patent Airway and No signs of Nausea or vomiting  Post-op Vital Signs: Reviewed and stable  Complications: There were no known notable events for this encounter.

## 2021-10-13 ENCOUNTER — Encounter: Payer: Self-pay | Admitting: Ophthalmology

## 2021-10-18 ENCOUNTER — Encounter: Payer: Self-pay | Admitting: Ophthalmology

## 2021-10-25 NOTE — Discharge Instructions (Signed)

## 2021-10-26 ENCOUNTER — Other Ambulatory Visit: Payer: Self-pay

## 2021-10-26 ENCOUNTER — Encounter: Payer: Self-pay | Admitting: Ophthalmology

## 2021-10-26 ENCOUNTER — Encounter
Admission: RE | Disposition: A | Discharge status: Home / Self Care | Payer: Self-pay | Source: Ambulatory Visit | Attending: Ophthalmology

## 2021-10-26 ENCOUNTER — Ambulatory Visit
Admission: RE | Admit: 2021-10-26 | Discharge: 2021-10-26 | Disposition: A | Discharge status: Home / Self Care | Payer: Medicare PPO | Source: Ambulatory Visit | Attending: Ophthalmology | Admitting: Ophthalmology

## 2021-10-26 ENCOUNTER — Ambulatory Visit: Payer: Medicare PPO | Admitting: General Practice

## 2021-10-26 DIAGNOSIS — E1151 Type 2 diabetes mellitus with diabetic peripheral angiopathy without gangrene: Secondary | ICD-10-CM | POA: Diagnosis not present

## 2021-10-26 DIAGNOSIS — Z8249 Family history of ischemic heart disease and other diseases of the circulatory system: Secondary | ICD-10-CM | POA: Insufficient documentation

## 2021-10-26 DIAGNOSIS — K219 Gastro-esophageal reflux disease without esophagitis: Secondary | ICD-10-CM | POA: Diagnosis not present

## 2021-10-26 DIAGNOSIS — F419 Anxiety disorder, unspecified: Secondary | ICD-10-CM | POA: Insufficient documentation

## 2021-10-26 DIAGNOSIS — Z7984 Long term (current) use of oral hypoglycemic drugs: Secondary | ICD-10-CM | POA: Diagnosis not present

## 2021-10-26 DIAGNOSIS — H2511 Age-related nuclear cataract, right eye: Secondary | ICD-10-CM | POA: Insufficient documentation

## 2021-10-26 DIAGNOSIS — Z833 Family history of diabetes mellitus: Secondary | ICD-10-CM | POA: Insufficient documentation

## 2021-10-26 DIAGNOSIS — G4733 Obstructive sleep apnea (adult) (pediatric): Secondary | ICD-10-CM | POA: Diagnosis not present

## 2021-10-26 DIAGNOSIS — J449 Chronic obstructive pulmonary disease, unspecified: Secondary | ICD-10-CM | POA: Insufficient documentation

## 2021-10-26 DIAGNOSIS — I10 Essential (primary) hypertension: Secondary | ICD-10-CM | POA: Insufficient documentation

## 2021-10-26 DIAGNOSIS — E1136 Type 2 diabetes mellitus with diabetic cataract: Secondary | ICD-10-CM | POA: Diagnosis present

## 2021-10-26 HISTORY — PX: CATARACT EXTRACTION W/PHACO: SHX586

## 2021-10-26 LAB — GLUCOSE, CAPILLARY: Glucose-Capillary: 126 mg/dL — ABNORMAL HIGH (ref 70–99)

## 2021-10-26 SURGERY — PHACOEMULSIFICATION, CATARACT, WITH IOL INSERTION
Anesthesia: Monitor Anesthesia Care | Site: Eye | Laterality: Right

## 2021-10-26 MED ORDER — FENTANYL CITRATE (PF) 100 MCG/2ML IJ SOLN
INTRAMUSCULAR | Status: DC | PRN
  Administered 2021-10-26: 50 ug via INTRAVENOUS

## 2021-10-26 MED ORDER — SIGHTPATH DOSE#1 BSS IO SOLN
INTRAOCULAR | Status: DC | PRN
  Administered 2021-10-26: 63 mL via OPHTHALMIC

## 2021-10-26 MED ORDER — TETRACAINE HCL 0.5 % OP SOLN
1.0000 [drp] | OPHTHALMIC | Status: DC | PRN
  Administered 2021-10-26 (×3): 1 [drp] via OPHTHALMIC

## 2021-10-26 MED ORDER — MOXIFLOXACIN HCL 0.5 % OP SOLN
OPHTHALMIC | Status: DC | PRN
  Administered 2021-10-26: 0.2 mL via OPHTHALMIC

## 2021-10-26 MED ORDER — LACTATED RINGERS IV SOLN: INTRAVENOUS | Status: DC

## 2021-10-26 MED ORDER — ARMC OPHTHALMIC DILATING DROPS
1.0000 | OPHTHALMIC | Status: DC | PRN
Start: 2021-10-26 — End: 2021-10-26
  Administered 2021-10-26 (×3): 1 via OPHTHALMIC

## 2021-10-26 MED ORDER — MIDAZOLAM HCL 2 MG/2ML IJ SOLN
INTRAMUSCULAR | Status: DC | PRN
  Administered 2021-10-26: 1 mg via INTRAVENOUS

## 2021-10-26 MED ORDER — SIGHTPATH DOSE#1 NA HYALUR & NA CHOND-NA HYALUR IO KIT
PACK | INTRAOCULAR | Status: DC | PRN
  Administered 2021-10-26: 1 via OPHTHALMIC

## 2021-10-26 MED ORDER — BRIMONIDINE TARTRATE-TIMOLOL 0.2-0.5 % OP SOLN
OPHTHALMIC | Status: DC | PRN
  Administered 2021-10-26: 1 [drp] via OPHTHALMIC

## 2021-10-26 MED ORDER — SIGHTPATH DOSE#1 BSS IO SOLN
INTRAOCULAR | Status: DC | PRN
  Administered 2021-10-26: 1 mL via INTRAMUSCULAR

## 2021-10-26 MED ORDER — SIGHTPATH DOSE#1 BSS IO SOLN
INTRAOCULAR | Status: DC | PRN
  Administered 2021-10-26: 15 mL

## 2021-10-26 MED ORDER — NA HYALUR & NA CHOND-NA HYALUR 0.55-0.5 ML IO KIT
PACK | INTRAOCULAR | Status: DC | PRN
  Administered 2021-10-26: 1 via OPHTHALMIC

## 2021-10-26 SURGICAL SUPPLY — 12 items
CATARACT SUITE SIGHTPATH (MISCELLANEOUS) ×1 IMPLANT
FEE CATARACT SUITE SIGHTPATH (MISCELLANEOUS) ×1 IMPLANT
GLOVE SRG 8 PF TXTR STRL LF DI (GLOVE) ×1 IMPLANT
GLOVE SURG ENC TEXT LTX SZ7.5 (GLOVE) ×1 IMPLANT
GLOVE SURG UNDER POLY LF SZ8 (GLOVE) ×1
LENS CLAREON VIVITY TORIC 24.5 ×1 IMPLANT
LENS IOL CLRN VT TRC 3 24.5 IMPLANT
NDL FILTER BLUNT 18X1 1/2 (NEEDLE) ×1 IMPLANT
NEEDLE FILTER BLUNT 18X1 1/2 (NEEDLE) ×1 IMPLANT
PHACO TIP KELMAN 45DEG (TIP) IMPLANT
SYR 3ML LL SCALE MARK (SYRINGE) ×1 IMPLANT
WATER STERILE IRR 250ML POUR (IV SOLUTION) ×1 IMPLANT

## 2021-10-26 NOTE — Anesthesia Postprocedure Evaluation (Signed)
Anesthesia Post Note  Patient: Amber Welch  Procedure(s) Performed: CATARACT EXTRACTION PHACO AND INTRAOCULAR LENS PLACEMENT ([facility]) RIGHT clarion vivity lens 12.00 01:14.7 (Right: Eye)  Patient location during evaluation: PACU Anesthesia Type: MAC Level of consciousness: awake and alert Pain management: pain level controlled Vital Signs Assessment: post-procedure vital signs reviewed and stable Respiratory status: spontaneous breathing, nonlabored ventilation, respiratory function stable and patient connected to nasal cannula oxygen Cardiovascular status: stable and blood pressure returned to baseline Postop Assessment: no apparent nausea or vomiting Anesthetic complications: no   There were no known notable events for this encounter.   Last Vitals:  Vitals:   10/26/21 0901 10/26/21 0907  BP:  (!) 155/82  Pulse: 64 65  Resp: 11 13  Temp: (!) 36.4 C (!) 36.4 C  SpO2: 98% 97%    Last Pain:  Vitals:   10/26/21 0907  TempSrc:   PainSc: 0-No pain                 Martha Clan

## 2021-10-26 NOTE — Transfer of Care (Signed)
Immediate Anesthesia Transfer of Care Note  Patient: Amber Welch  Procedure(s) Performed: CATARACT EXTRACTION PHACO AND INTRAOCULAR LENS PLACEMENT ([facility]) RIGHT clarion vivity lens 12.00 01:14.7 (Right: Eye)  Patient Location: PACU  Anesthesia Type: MAC  Level of Consciousness: awake, alert  and patient cooperative  Airway and Oxygen Therapy: Patient Spontanous Breathing and Patient connected to supplemental oxygen  Post-op Assessment: Post-op Vital signs reviewed, Patient's Cardiovascular Status Stable, Respiratory Function Stable, Patent Airway and No signs of Nausea or vomiting  Post-op Vital Signs: Reviewed and stable  Complications: There were no known notable events for this encounter.

## 2021-10-26 NOTE — Anesthesia Preprocedure Evaluation (Signed)
Anesthesia Evaluation  Patient identified by MRN, date of birth, ID band Patient awake    Reviewed: Allergy & Precautions, NPO status , Patient's Chart, lab work & pertinent test results  History of Anesthesia Complications Negative for: history of anesthetic complications  Airway Mallampati: III  TM Distance: >3 FB Neck ROM: full    Dental  (+) Poor Dentition   Pulmonary shortness of breath, asthma , sleep apnea , COPD, neg recent URI,    Pulmonary exam normal        Cardiovascular Exercise Tolerance: Poor hypertension, (-) angina+ Peripheral Vascular Disease  Normal cardiovascular exam  Carotid artery disease   Neuro/Psych PSYCHIATRIC DISORDERS Anxiety Depression  Neuromuscular disease (diabetic neuropathy)    GI/Hepatic Neg liver ROS, GERD  ,  Endo/Other  diabetes, Type 2  Renal/GU      Musculoskeletal   Abdominal Normal abdominal exam  (+)   Peds  Hematology negative hematology ROS (+)   Anesthesia Other Findings Past Medical History: No date: Anxiety No date: Arthritis     Comment:  Back and knees No date: Asthma     Comment:  a. chronic SOB No date: Carotid artery disease ([place])     Comment:  a. carotid doppler 2016: 78% RICA, <29% LICA No date: COPD (chronic obstructive pulmonary disease) (HCC) No date: Depression No date: Diabetes type 2, controlled ([place]) No date: GERD (gastroesophageal reflux disease) No date: Hyperlipidemia No date: Insomnia No date: Labile hypertension     Comment:  a. when she is stressed BP will increase to the 200's               systolic No date: Medication intolerance No date: Morbid obesity ([place]) No date: Normal coronary arteries     Comment:  a. normal cath 2003; b. normal Myoview in 2008 & 2009 No date: OSA (obstructive sleep apnea)     Comment:  a. BiPAP No date: Physical deconditioning No date: Vertigo     Comment:  after root canal  Past Surgical  History: 07/31/2014: COLONOSCOPY WITH PROPOFOL; N/A     Comment:  Procedure: COLONOSCOPY WITH PROPOFOL;  Surgeon: Hulen Luster, MD;  Location: [facility] ENDOSCOPY;  Service:               Gastroenterology;  Laterality: N/A; No date: DILATION AND CURETTAGE OF UTERUS No date: FOOT SURGERY     Comment:  Left foot, bunionectomy and hammer toe 06/05/11: JOINT REPLACEMENT     Comment:  Right knee replaced 09/20/11: JOINT REPLACEMENT     Comment:  Left knee April 2015: right foot surgery; Right     Comment:  Right Foot and Ankle No date: right knee surgery     Comment:  arthroscopy for torn meniscus  BMI    Body Mass Index: 36.61 kg/m      Reproductive/Obstetrics negative OB ROS                             Anesthesia Physical  Anesthesia Plan  ASA: 3  Anesthesia Plan: MAC   Post-op Pain Management:    Induction:   PONV Risk Score and Plan:   Airway Management Planned: Natural Airway  Additional Equipment:   Intra-op Plan:   Post-operative Plan:   Informed Consent: I have reviewed the patients History and Physical, chart, labs and discussed the procedure including the risks, benefits  and alternatives for the proposed anesthesia with the patient or authorized representative who has indicated his/her understanding and acceptance.     Dental advisory given  Plan Discussed with: Anesthesiologist, CRNA and Surgeon  Anesthesia Plan Comments:         Anesthesia Quick Evaluation

## 2021-10-26 NOTE — Op Note (Signed)
LOCATION:  [place]   PREOPERATIVE DIAGNOSIS:  Nuclear sclerotic cataract of the right eye.  H25.11   POSTOPERATIVE DIAGNOSIS:  Nuclear sclerotic cataract of the right eye.   PROCEDURE:  Phacoemulsification with Toric posterior chamber intraocular lens placement of the right eye.  Ultrasound time: Procedure(s) with comments: CATARACT EXTRACTION PHACO AND INTRAOCULAR LENS PLACEMENT (I[facility]) RIGHT clarion vivity lens 12.00 01:14.7 (Right) - Diabetic  LENS:   Implant Name Type Inv. Item Serial No. Manufacturer Lot No. LRB No. Used Action  CLAREON VIVITY TORIC IOL Intraocular Lens  [id number] ALCON  Right 1 Implanted     [id number] 24.5 Vivity Toric intraocular lens with 1.5 diopters of cylindrical power with axis orientation at 153 degrees with reverse optic capture   SURGEON:  Wyonia Hough, MD   ANESTHESIA: Topical with tetracaine drops and 2% Xylocaine jelly, augmented with 1% preservative-free intracameral lidocaine. .   COMPLICATIONS:  posterior capsule tear after IOL insertion with no vitreous loss. Lens was captured by reverse optic capture.   DESCRIPTION OF PROCEDURE:  The patient was identified in the holding room and transported to the operating suite and placed in the supine position under the operating microscope.  The right eye was identified as the operative eye, and it was prepped and draped in the usual sterile ophthalmic fashion.    A clear-corneal paracentesis incision was made at the 12:00 position.  0.5 ml of preservative-free 1% lidocaine was injected into the anterior chamber. The anterior chamber was filled with Viscoat.  A 2.4 millimeter near clear corneal incision was then made at the 9:00 position.  A cystotome and capsulorrhexis forceps were then used to make a curvilinear capsulorrhexis.  Hydrodissection and hydrodelineation were then performed using balanced salt solution.   Phacoemulsification was then used in stop and chop fashion to remove the  lens, nucleus and epinucleus.  The remaining cortex was aspirated using the irrigation and aspiration handpiece.  Provisc viscoelastic was then placed into the capsular bag to distend it for lens placement.  The Verion digital marker was used to align the implant at the intended axis.   A Toric lens was then injected into the capsular bag.  It was rotated clockwise until the axis marks on the lens were approximately 15 degrees in the counterclockwise direction to the intended alignment.  The viscoelastic was aspirated from the eye using the irrigation aspiration handpiece. A posterior capsule tear was noted.  Additional provisc was placed into the anterior chamber. Then, a Koch spatula through the sideport incision was used to elevate the lens optic into the sulcus, and leave the haptics posterior to the capsule. Rotation of the lens in a clockwise direction until the axis markings of the intraocular lens were lined up with the Verion alignment was done with the spatula.   Balanced salt solution was then used to hydrate the wounds. Vigamox 0.2 ml of a '1mg'$  per ml solution was injected into the anterior chamber for a dose of 0.2 mg of intracameral antibiotic at the completion of the case. Provisc was aspirated.  No vitreous presented into the anterior chamber.    The eye was noted to have a physiologic pressure and there was no wound leak noted.   Timolol and Brimonidine drops were applied to the eye.  The patient was taken to the recovery room in stable condition having had no complications of anesthesia or surgery.  Nzinga Ferran 10/26/2021, 8:59 AM

## 2021-10-26 NOTE — H&P (Signed)
[facility]   Primary Care Physician:  Rusty Aus, MD Ophthalmologist: Dr. Leandrew Koyanagi  Pre-Procedure History & Physical: HPI:  Amber Welch is a 87 y.o. female here for ophthalmic surgery.   Past Medical History:  Diagnosis Date   Anxiety    Arthritis    Back and knees   Asthma    a. chronic SOB   Carotid artery disease ([place])    a. carotid doppler 2016: 82% RICA, <50% LICA   COPD (chronic obstructive pulmonary disease) (HCC)    Depression    Diabetes type 2, controlled ([place])    GERD (gastroesophageal reflux disease)    Hyperlipidemia    Insomnia    Labile hypertension    a. when she is stressed BP will increase to the 539'J systolic   Medication intolerance    Morbid obesity ([place])    Normal coronary arteries    a. normal cath 2003; b. normal Myoview in 2008 & 2009   OSA (obstructive sleep apnea)    a. BiPAP   Physical deconditioning    Vertigo    after root canal    Past Surgical History:  Procedure Laterality Date   CATARACT EXTRACTION W/PHACO Left 10/12/2021   Procedure: CATARACT EXTRACTION PHACO AND INTRAOCULAR LENS PLACEMENT ([place]) LEFT CLAREON VITITY;  Surgeon: Leandrew Koyanagi, MD;  Location: [place];  Service: Ophthalmology;  Laterality: Left;  Diabetic 5.85 00:53.4   COLONOSCOPY WITH PROPOFOL N/A 07/31/2014   Procedure: COLONOSCOPY WITH PROPOFOL;  Surgeon: Hulen Luster, MD;  Location: [facility] ENDOSCOPY;  Service: Gastroenterology;  Laterality: N/A;   DILATION AND CURETTAGE OF UTERUS     FOOT SURGERY     Left foot, bunionectomy and hammer toe   JOINT REPLACEMENT  06/05/11   Right knee replaced   JOINT REPLACEMENT  09/20/11   Left knee   right foot surgery Right April 2015   Right Foot and Ankle   right knee surgery     arthroscopy for torn meniscus    Prior to Admission medications   Medication Sig Start Date End Date Taking? Authorizing Provider  aspirin 81 MG tablet Take 81 mg by mouth daily.   Yes [provider]  Azelastine HCl (ASTEPRO NA) Place 1 spray into the nose at bedtime as needed (congestion).    Yes [provider]  Azilsartan Medoxomil 80 MG TABS Take 1 tablet by mouth daily.    Yes [provider]  carvedilol (COREG) 12.5 MG tablet Take 1 tablet (12.5 mg total) by mouth 2 (two) times daily with a meal. Patient taking differently: Take 6.25 mg by mouth 2 (two) times daily with a meal. 10/17/20  Yes Jennye Boroughs, MD  Cholecalciferol 50 MCG (2000 UT) TABS Take by mouth.   Yes [provider]  docusate sodium (COLACE) 100 MG capsule Take 100 mg by mouth 2 (two) times daily.    Yes [provider]  esomeprazole (NEXIUM) 40 MG capsule Take 40 mg by mouth daily before breakfast.     Yes [provider]  Fluticasone-Salmeterol (ADVAIR) 100-50 MCG/DOSE AEPB USE 1 PUFF EVERY 12 HOURS AS DIRECTED. RINSE MOUTH AFTER USING. 03/12/19  Yes [provider]  gabapentin (NEURONTIN) 300 MG capsule Take 300-600 mg by mouth See admin instructions. Take 300 mg by mouth in the morning, 300 mg at noon, and 300 mg at bedtime. 05/04/20  Yes [provider]  hydrALAZINE (APRESOLINE) 25 MG tablet Take 25 mg by mouth 3 (three) times  daily.   Yes [provider]  LORazepam (ATIVAN) 1 MG tablet Take 1 tablet (1 mg total) by mouth at bedtime as needed for anxiety. 10/17/20  Yes Jennye Boroughs, MD  magnesium gluconate (MAGONATE) 500 MG tablet Take 500 mg by mouth daily.   Yes [provider]  metFORMIN (GLUCOPHAGE) 500 MG tablet Take 500 mg by mouth daily.     Yes [provider]  metroNIDAZOLE (METROGEL) 0.75 % gel Apply topically 2 (two) times daily. 08/19/19  Yes [provider]  mometasone (NASONEX) 50 MCG/ACT nasal spray Place 2 sprays into the nose daily as needed (allergies).    Yes [provider]  pravastatin (PRAVACHOL) 20 MG tablet Take 20 mg by mouth daily.  01/31/18  Yes [provider]  albuterol  (VENTOLIN HFA) 108 (90 Base) MCG/ACT inhaler Inhale 2 puffs into the lungs every 4 (four) hours as needed for wheezing. UAD 01/08/15   Flora Lipps, MD    Allergies as of 08/16/2021 - Review Complete 10/16/2020  Allergen Reaction Noted   Penicillins Swelling, Rash, and Other (See Comments)    Ace inhibitors Other (See Comments) 07/30/2014   Cefuroxime axetil Diarrhea and Other (See Comments) 02/07/2016   Celebrex [celecoxib] Cough 07/30/2014   Statins Other (See Comments) 07/30/2014   Iodinated contrast media Swelling and Rash 07/30/2014   Norpramin [desipramine] Rash 07/30/2014   Phenobarbital Swelling and Rash    Sinequan [doxepin] Rash 07/30/2014    Family History  Problem Relation Age of Onset   Heart disease Father        Heart Disease before age 67   Diabetes Father    Hyperlipidemia Father    Heart attack Father    Heart disease Mother    Hypertension Mother    Heart attack Mother    Breast cancer Cousin 34    Social History   Socioeconomic History   Marital status: Married    Spouse name: Not on file   Number of children: Not on file   Years of education: Not on file   Highest education level: Not on file  Occupational History   Occupation: retired  Tobacco Use   Smoking status: Never   Smokeless tobacco: Never   Tobacco comments:    tobacco use - no  Vaping Use   Vaping Use: Never used  Substance and Sexual Activity   Alcohol use: No    Alcohol/week: 0.0 standard drinks of alcohol   Drug use: No   Sexual activity: Not on file  Other Topics Concern   Not on file  Social History Narrative   Retired, married does not get regular exercise.    Social Determinants of Health   Financial Resource Strain: Not on file  Food Insecurity: Not on file  Transportation Needs: Not on file  Physical Activity: Not on file  Stress: Not on file  Social Connections: Not on file  Intimate Partner Violence: Not on file    Review of Systems: See HPI, otherwise  negative ROS  Physical Exam: BP (!) 170/66   Temp 98.4 F (36.9 C) (Tympanic)   Ht 5' 5.51" (1.664 m)   Wt 93.4 kg   SpO2 95%   BMI 33.75 kg/m  General:   Alert,  pleasant and cooperative in NAD Head:  Normocephalic and atraumatic. Lungs:  Clear to auscultation.    Heart:  Regular rate and rhythm.   Impression/Plan: Amber Welch is here for ophthalmic surgery.  Risks, benefits, limitations, and alternatives regarding  ophthalmic surgery have been reviewed with the patient.  Questions have been answered.  All parties agreeable.   Leandrew Koyanagi, MD  10/26/2021, 8:01 AM

## 2021-11-01 ENCOUNTER — Encounter: Payer: Self-pay | Admitting: Ophthalmology

## 2022-01-11 ENCOUNTER — Other Ambulatory Visit: Payer: Self-pay | Admitting: Internal Medicine

## 2022-01-11 DIAGNOSIS — Z1231 Encounter for screening mammogram for malignant neoplasm of breast: Secondary | ICD-10-CM
